# Patient Record
Sex: Female | Born: 1955 | Race: Black or African American | Hispanic: No | Marital: Single | Smoking: Former smoker
Health system: Southern US, Community
[De-identification: ages and names within clinical notes are randomized; demographics above are authoritative.]

## PROBLEM LIST (undated history)

## (undated) DIAGNOSIS — M1990 Unspecified osteoarthritis, unspecified site: Secondary | ICD-10-CM

## (undated) DIAGNOSIS — F32A Depression, unspecified: Secondary | ICD-10-CM

## (undated) DIAGNOSIS — I10 Essential (primary) hypertension: Secondary | ICD-10-CM

## (undated) DIAGNOSIS — R011 Cardiac murmur, unspecified: Secondary | ICD-10-CM

## (undated) DIAGNOSIS — F419 Anxiety disorder, unspecified: Secondary | ICD-10-CM

## (undated) DIAGNOSIS — E119 Type 2 diabetes mellitus without complications: Secondary | ICD-10-CM

## (undated) DIAGNOSIS — K859 Acute pancreatitis without necrosis or infection, unspecified: Secondary | ICD-10-CM

## (undated) DIAGNOSIS — G709 Myoneural disorder, unspecified: Secondary | ICD-10-CM

## (undated) DIAGNOSIS — D649 Anemia, unspecified: Secondary | ICD-10-CM

## (undated) HISTORY — PX: COLONOSCOPY W/ POLYPECTOMY: SHX1380

## (undated) HISTORY — PX: TONSILLECTOMY: SUR1361

## (undated) HISTORY — PX: UTERINE FIBROID SURGERY: SHX826

## (undated) HISTORY — PX: CERVICAL POLYPECTOMY: SHX88

## (undated) HISTORY — DX: Anemia, unspecified: D64.9

## (undated) HISTORY — PX: CHOLECYSTECTOMY: SHX55

---

## 1997-10-09 ENCOUNTER — Emergency Department (HOSPITAL_COMMUNITY): Admission: EM | Admit: 1997-10-09 | Discharge: 1997-10-09 | Payer: Self-pay | Admitting: Emergency Medicine

## 1997-10-20 ENCOUNTER — Ambulatory Visit (HOSPITAL_BASED_OUTPATIENT_CLINIC_OR_DEPARTMENT_OTHER): Admission: RE | Admit: 1997-10-20 | Discharge: 1997-10-20 | Payer: Self-pay | Admitting: Orthopedic Surgery

## 1999-04-23 ENCOUNTER — Encounter: Payer: Self-pay | Admitting: *Deleted

## 1999-04-23 ENCOUNTER — Ambulatory Visit (HOSPITAL_COMMUNITY): Admission: RE | Admit: 1999-04-23 | Discharge: 1999-04-23 | Payer: Self-pay | Admitting: Family Medicine

## 1999-08-25 ENCOUNTER — Encounter: Payer: Self-pay | Admitting: Family Medicine

## 1999-08-25 ENCOUNTER — Ambulatory Visit (HOSPITAL_COMMUNITY): Admission: RE | Admit: 1999-08-25 | Discharge: 1999-08-25 | Payer: Self-pay | Admitting: Family Medicine

## 2000-12-22 ENCOUNTER — Ambulatory Visit (HOSPITAL_COMMUNITY): Admission: RE | Admit: 2000-12-22 | Discharge: 2000-12-22 | Payer: Self-pay | Admitting: Internal Medicine

## 2002-06-19 ENCOUNTER — Other Ambulatory Visit: Admission: RE | Admit: 2002-06-19 | Discharge: 2002-06-19 | Payer: Self-pay | Admitting: Obstetrics and Gynecology

## 2002-08-26 ENCOUNTER — Ambulatory Visit (HOSPITAL_COMMUNITY): Admission: RE | Admit: 2002-08-26 | Discharge: 2002-08-26 | Payer: Self-pay | Admitting: Obstetrics and Gynecology

## 2002-08-26 ENCOUNTER — Encounter (INDEPENDENT_AMBULATORY_CARE_PROVIDER_SITE_OTHER): Payer: Self-pay | Admitting: *Deleted

## 2002-10-24 ENCOUNTER — Encounter (INDEPENDENT_AMBULATORY_CARE_PROVIDER_SITE_OTHER): Payer: Self-pay | Admitting: Specialist

## 2002-10-24 ENCOUNTER — Ambulatory Visit (HOSPITAL_BASED_OUTPATIENT_CLINIC_OR_DEPARTMENT_OTHER): Admission: RE | Admit: 2002-10-24 | Discharge: 2002-10-24 | Payer: Self-pay | Admitting: Otolaryngology

## 2004-02-16 ENCOUNTER — Other Ambulatory Visit: Admission: RE | Admit: 2004-02-16 | Discharge: 2004-02-16 | Payer: Self-pay | Admitting: Obstetrics and Gynecology

## 2004-04-05 ENCOUNTER — Ambulatory Visit: Payer: Self-pay | Admitting: Internal Medicine

## 2004-04-06 ENCOUNTER — Ambulatory Visit: Payer: Self-pay | Admitting: Internal Medicine

## 2005-03-21 ENCOUNTER — Ambulatory Visit: Payer: Self-pay | Admitting: Internal Medicine

## 2005-03-25 ENCOUNTER — Ambulatory Visit: Payer: Self-pay | Admitting: Internal Medicine

## 2005-04-19 ENCOUNTER — Ambulatory Visit: Payer: Self-pay | Admitting: Internal Medicine

## 2006-02-26 ENCOUNTER — Emergency Department (HOSPITAL_COMMUNITY): Admission: EM | Admit: 2006-02-26 | Discharge: 2006-02-26 | Payer: Self-pay | Admitting: Emergency Medicine

## 2009-04-16 ENCOUNTER — Encounter (INDEPENDENT_AMBULATORY_CARE_PROVIDER_SITE_OTHER): Payer: Self-pay | Admitting: *Deleted

## 2009-04-25 HISTORY — PX: ESOPHAGOGASTRODUODENOSCOPY: SHX1529

## 2009-04-25 HISTORY — PX: COLONOSCOPY W/ POLYPECTOMY: SHX1380

## 2009-08-31 ENCOUNTER — Telehealth: Payer: Self-pay | Admitting: Internal Medicine

## 2009-09-09 ENCOUNTER — Encounter (INDEPENDENT_AMBULATORY_CARE_PROVIDER_SITE_OTHER): Payer: Self-pay | Admitting: *Deleted

## 2009-09-17 ENCOUNTER — Ambulatory Visit: Payer: Self-pay | Admitting: Internal Medicine

## 2009-09-17 ENCOUNTER — Encounter (INDEPENDENT_AMBULATORY_CARE_PROVIDER_SITE_OTHER): Payer: Self-pay | Admitting: *Deleted

## 2009-09-18 ENCOUNTER — Telehealth: Payer: Self-pay | Admitting: Internal Medicine

## 2009-09-22 ENCOUNTER — Ambulatory Visit: Payer: Self-pay | Admitting: Internal Medicine

## 2009-09-25 ENCOUNTER — Encounter: Payer: Self-pay | Admitting: Internal Medicine

## 2010-01-26 ENCOUNTER — Encounter: Admission: RE | Admit: 2010-01-26 | Discharge: 2010-01-26 | Payer: Self-pay | Admitting: Family Medicine

## 2010-02-18 ENCOUNTER — Ambulatory Visit (HOSPITAL_COMMUNITY): Admission: RE | Admit: 2010-02-18 | Discharge: 2010-02-18 | Payer: Self-pay | Admitting: Surgery

## 2010-05-13 ENCOUNTER — Inpatient Hospital Stay (HOSPITAL_COMMUNITY)
Admission: EM | Admit: 2010-05-13 | Discharge: 2010-05-15 | Payer: Self-pay | Source: Home / Self Care | Attending: Internal Medicine | Admitting: Internal Medicine

## 2010-05-17 LAB — GLUCOSE, CAPILLARY
Glucose-Capillary: 181 mg/dL — ABNORMAL HIGH (ref 70–99)
Glucose-Capillary: 130 mg/dL — ABNORMAL HIGH (ref 70–99)
Glucose-Capillary: 132 mg/dL — ABNORMAL HIGH (ref 70–99)
Glucose-Capillary: 136 mg/dL — ABNORMAL HIGH (ref 70–99)
Glucose-Capillary: 125 mg/dL — ABNORMAL HIGH (ref 70–99)
Glucose-Capillary: 140 mg/dL — ABNORMAL HIGH (ref 70–99)
Glucose-Capillary: 149 mg/dL — ABNORMAL HIGH (ref 70–99)
Glucose-Capillary: 113 mg/dL — ABNORMAL HIGH (ref 70–99)

## 2010-05-17 LAB — URINE CULTURE: Culture  Setup Time: 201201190456

## 2010-05-17 LAB — CBC
HCT: 37 % (ref 36.0–46.0)
Hemoglobin: 12.2 g/dL (ref 12.0–15.0)
Hemoglobin: 10.8 g/dL — ABNORMAL LOW (ref 12.0–15.0)
MCH: 27.8 pg (ref 26.0–34.0)
MCV: 85.5 fL (ref 78.0–100.0)
Platelets: 359 10*3/uL (ref 150–400)
RBC: 4.33 MIL/uL (ref 3.87–5.11)
RBC: 3.88 MIL/uL (ref 3.87–5.11)
RDW: 12.9 % (ref 11.5–15.5)
WBC: 11.4 10*3/uL — ABNORMAL HIGH (ref 4.0–10.5)
WBC: 11.6 10*3/uL — ABNORMAL HIGH (ref 4.0–10.5)
WBC: 7.5 10*3/uL (ref 4.0–10.5)

## 2010-05-17 LAB — RAPID URINE DRUG SCREEN, HOSP PERFORMED
Barbiturates: NOT DETECTED
Benzodiazepines: NOT DETECTED

## 2010-05-17 LAB — HEMOGLOBIN A1C
Hgb A1c MFr Bld: 6.8 % — ABNORMAL HIGH (ref <5.7)
Mean Plasma Glucose: 148 mg/dL — ABNORMAL HIGH (ref <117)

## 2010-05-17 LAB — IRON AND TIBC
Iron: 35 ug/dL — ABNORMAL LOW (ref 42–135)
Saturation Ratios: 11 % — ABNORMAL LOW (ref 20–55)
TIBC: 311 ug/dL (ref 250–470)
UIBC: 276 ug/dL

## 2010-05-17 LAB — BASIC METABOLIC PANEL
CO2: 26 mEq/L (ref 19–32)
Chloride: 108 mEq/L (ref 96–112)
Creatinine, Ser: 0.86 mg/dL (ref 0.4–1.2)
GFR calc Af Amer: >60 mL/min (ref >60)
Potassium: 3.5 mEq/L (ref 3.5–5.1)
Sodium: 140 mEq/L (ref 135–145)

## 2010-05-17 LAB — URINALYSIS, ROUTINE W REFLEX MICROSCOPIC
Hgb urine dipstick: NEGATIVE
Ketones, ur: NEGATIVE mg/dL
Protein, ur: NEGATIVE mg/dL
Urobilinogen, UA: 0.2 mg/dL (ref 0.0–1.0)

## 2010-05-17 LAB — CARDIAC PANEL(CRET KIN+CKTOT+MB+TROPI)
CK, MB: 1.4 ng/mL (ref 0.3–4.0)
Relative Index: INVALID (ref 0.0–2.5)
Total CK: 90 U/L (ref 7–177)
Troponin I: 0.01 ng/mL (ref 0.00–0.06)
Troponin I: <0.01 ng/mL (ref 0.00–0.06)

## 2010-05-17 LAB — POCT I-STAT, CHEM 8
BUN: 19 mg/dL (ref 6–23)
Calcium, Ion: 1.07 mmol/L — ABNORMAL LOW (ref 1.12–1.32)
Chloride: 106 mEq/L (ref 96–112)
Glucose, Bld: 153 mg/dL — ABNORMAL HIGH (ref 70–99)
HCT: 39 % (ref 36.0–46.0)
Hemoglobin: 13.3 g/dL (ref 12.0–15.0)
Sodium: 142 mEq/L (ref 135–145)

## 2010-05-17 LAB — DIFFERENTIAL
Basophils Absolute: 0 10*3/uL (ref 0.0–0.1)
Basophils Absolute: 0 10*3/uL (ref 0.0–0.1)
Basophils Relative: 0 % (ref 0–1)
Basophils Relative: 0 % (ref 0–1)
Eosinophils Absolute: 0.2 10*3/uL (ref 0.0–0.7)
Eosinophils Relative: 2 % (ref 0–5)
Lymphocytes Relative: 40 % (ref 12–46)
Monocytes Absolute: 0.5 10*3/uL (ref 0.1–1.0)
Neutro Abs: 3.6 10*3/uL (ref 1.7–7.7)
Neutrophils Relative %: 48 % (ref 43–77)

## 2010-05-17 LAB — COMPREHENSIVE METABOLIC PANEL
ALT: 19 U/L (ref 0–35)
Albumin: 4.2 g/dL (ref 3.5–5.2)
Albumin: 4.3 g/dL (ref 3.5–5.2)
Alkaline Phosphatase: 65 U/L (ref 39–117)
Alkaline Phosphatase: 66 U/L (ref 39–117)
BUN: 16 mg/dL (ref 6–23)
BUN: 16 mg/dL (ref 6–23)
CO2: 24 mEq/L (ref 19–32)
Chloride: 105 mEq/L (ref 96–112)
Chloride: 108 mEq/L (ref 96–112)
Creatinine, Ser: 0.88 mg/dL (ref 0.4–1.2)
GFR calc non Af Amer: >60 mL/min (ref >60)
Glucose, Bld: 159 mg/dL — ABNORMAL HIGH (ref 70–99)
Potassium: 3.7 mEq/L (ref 3.5–5.1)
Potassium: 3.9 mEq/L (ref 3.5–5.1)
Total Bilirubin: 0.4 mg/dL (ref 0.3–1.2)
Total Bilirubin: 0.4 mg/dL (ref 0.3–1.2)

## 2010-05-17 LAB — APTT: aPTT: 25 seconds (ref 24–37)

## 2010-05-17 LAB — LIPID PANEL
HDL: 38 mg/dL — ABNORMAL LOW (ref >39)
LDL Cholesterol: 64 mg/dL (ref 0–99)
Total CHOL/HDL Ratio: 3 RATIO
Triglycerides: 63 mg/dL (ref <150)
VLDL: 13 mg/dL (ref 0–40)

## 2010-05-17 LAB — URINE MICROSCOPIC-ADD ON

## 2010-05-17 LAB — PROTIME-INR: INR: 0.89 (ref 0.00–1.49)

## 2010-05-18 LAB — BASIC METABOLIC PANEL
Calcium: 9.5 mg/dL (ref 8.4–10.5)
GFR calc Af Amer: >60 mL/min (ref >60)
GFR calc non Af Amer: >60 mL/min (ref >60)
Glucose, Bld: 136 mg/dL — ABNORMAL HIGH (ref 70–99)
Potassium: 4.3 mEq/L (ref 3.5–5.1)
Sodium: 140 mEq/L (ref 135–145)

## 2010-05-18 LAB — CBC
HCT: 35.9 % — ABNORMAL LOW (ref 36.0–46.0)
Hemoglobin: 11.8 g/dL — ABNORMAL LOW (ref 12.0–15.0)
MCHC: 32.9 g/dL (ref 30.0–36.0)
RDW: 13 % (ref 11.5–15.5)
WBC: 7.2 10*3/uL (ref 4.0–10.5)

## 2010-05-18 LAB — DIFFERENTIAL
Basophils Absolute: 0 10*3/uL (ref 0.0–0.1)
Basophils Relative: 0 % (ref 0–1)
Lymphocytes Relative: 43 % (ref 12–46)
Monocytes Absolute: 0.5 10*3/uL (ref 0.1–1.0)
Neutro Abs: 3.3 10*3/uL (ref 1.7–7.7)

## 2010-05-18 LAB — GLUCOSE, CAPILLARY: Glucose-Capillary: 108 mg/dL — ABNORMAL HIGH (ref 70–99)

## 2010-05-21 NOTE — Discharge Summary (Addendum)
NAMELAURELIN, Kimberly Hunt NO.:  [id number]  MEDICAL RECORD NO.:  [id number]          PATIENT TYPE:  INP  LOCATION:  2012                         FACILITY:  [facility]  PHYSICIAN:  Kimberly Harvest, MD    DATE OF BIRTH:  November 29, 1955  DATE OF ADMISSION:  05/13/2010 DATE OF DISCHARGE:  05/15/2010                              DISCHARGE SUMMARY   PRIMARY CARE PHYSICIAN:  Dr. Tiburcio Hunt of [facility].  DISCHARGE DIAGNOSES: 1. Syncope of unknown etiology. 2. Hypertension. 3. Cervical spinal stenosis with myelopathy C4-C5, C5-C6, C6-C7. 4. Probable urinary tract infection status post 3 days of IV     antibiotics. 5. Anemia of chronic disease, stable. 6. Diet controlled type 2 diabetes, hemoglobin A1c of 6.8 on May 13, 2010. 7. Anxiety. 8. Status post cholecystectomy. 9. Status post myomectomy.  DISCHARGE MEDICATIONS: 1. Hydrochlorothiazide 25 mg p.o. daily. 2. Hydrocodone/APAP 5/325 one to two tablets p.o. q.4 h p.r.n. 3. Lisinopril 40 mg p.o. daily. 4. Alprazolam 0.25 mg half to one tablet p.o. daily p.r.n. 5. Sertraline 100 mg p.o. q.h.s.  DISPOSITION AND FOLLOWUP:  The patient will be discharged home.  The patient is to follow up with PCP in the next 1 to 2 weeks.  On followup the patient will need a BMET checked to follow up on electrolytes and renal function as the patient's lisinopril has been increased.  The patient will need her blood pressure reassessed.  She has been maintained on HCTZ and lisinopril was increased from 20 mg to 40 mg daily for better blood pressure control.  The patient is also to call to schedule a followup appointment with Dr. Julio Hunt of [facility] in 1 week for further evaluation and recommendations for her cervical spinal stenosis with myelopathy which will probably require surgery.  CONSULTATIONS:  Neurosurgery was consulted.  The patient was seen by Dr. Jordan Hunt on May 14, 2010.  PROCEDURES PERFORMED:  Chest  x-ray was done May 13, 2010, that showed minimal basilar atelectasis.  No other acute findings identified. CT of the head without contrast was done on May 13, 2010, that showed unremarkable noncontrast head CT.  MRI/MRA of the head was done on May 13, 2010, which was negative MRI of the head; MRA was negative for fracture or cord edema.  MRI/MRA was negative.  MR of the C- spine did show multilevel spondylosis and spinal stenosis in C4-C5, C5- C6, C6-C7.  A 2-D echocardiogram was done on May 14, 2010, that showed a normal left ventricular cavity size, systolic function was normal, EF 55% to 60%.  The wall motion was normal.  There were no regional wall motion abnormalities.  Left atrium was mildly dilated. Carotid Dopplers were done on May 14, 2010, with preliminary results showing no stenosis in the right ICA, left ICA with 40% to 59% stenosis, bilateral vertebrals were antegrade is the preliminary results.  An EEG was done on May 14, 2010, that showed a normal EEG in the awake state.  BRIEF ADMISSION HISTORY AND PHYSICAL:  [name] is a 55 year old African American female with history of hypertension, type  2 diabetes diet controlled, history of chronic pain and anxiety that had gone to bed after which she felt very uneasy and thought she would need to go to the bathroom to urinate.  On the way she fell and lost her consciousness.  She woke up herself and again tried to go to the bathroom and fell again.  She states she might have lost consciousness for less than a minute.  She did not bite her tongue nor did she have any incontinence of urine.  Denied any headaches or visual symptoms or focal deficits.  No abdominal pain.  The patient was brought to the ED per EMS.  In the ED the patient had a CT of the head which was negative at which time the patient was being admitted for further workup for her syncope.  In the ED the patient had some chest  tightness lasting for about an hour.  At that time the patient's chest pain had resolved. Denied any nausea, no vomiting, no shortness of breath.  She did have some neck pain that had been there since she fell.  She has been persistent and has no relation to head movements or neck movements.  The patient's blood pressure was found to be elevated at 199/100.  The patient denied any fever.  No chills, no cough and no phlegm.  For rest of admission history and physical please see H and P dictated per Dr. Toniann Hunt of job number [id number].  HOSPITAL COURSE: 1. Syncope.  The patient was admitted for a syncopal episode.  She was     placed on telemetry and a syncope workup was undertaken.  A 2-D     echo was done with results as stated above which showed a normal EF     and normal 2-D echo.  The aortic valve was normal.  Carotid     Dopplers which were done did not show any significant stenosis,     preliminary results showed no stenosis in the right ICA, the left     ICA had 40% to 59% stenosis, however, this was not significant and     unlikely cause of her syncope.  The patient did not have any     arrhythmias on tele.  EEG which was done was negative for any     epileptiform discharges.  The patient remained stable.  She did not     have any further syncopal episodes during the hospitalization and     she will be discharged in stable and improved condition to follow     up with PCP as an outpatient.  On followup final carotid Doppler     readings will need to be followed up upon per PCP.  The patient     will be discharged in stable and improved condition. 2. Hypertension.  On admission the patient was admitted with an     elevated blood pressure.  The patient had been unable to fill her     blood pressure medications as an outpatient.  She was placed back     on her home regimen of 20 of lisinopril and HCTZ.  The patient's     blood pressure improved from the 190s to the 170s-180s.  The      lisinopril was further up titrated and 230 mg of lisinopril daily,     however, her systolic blood pressure was still in the 150s to the     160s and as such she will be discharged  home on an increased dose     of lisinopril at 40 mg daily.  She will be maintained on other     blood pressure medication of HCTZ 25 mg daily.  She will need     followup with her PCP for further blood pressure control and     titration of her blood pressure medications.  The patient will be     discharged in stable and improved condition. 3. Cervical spinal stenosis with myelopathy C4-C5, C5-C6, C6-C7.  On     admission the patient had had some complaints of neck pain.  The     patient had stated that the neck pain had been ongoing for several     months now and did have some occasional tingling on her bilateral     upper extremities.  An MR of the C-spine was done which did show     multilevel spondylosis and it also showed mild to moderate spinal     stenosis from C4-C5, C5-C6 and C6-C7.  Dr. Jordan Hunt of [facility] was consulted upon.  He looked at the films and came     in and spoke with the patient and he felt that the patient could be     seen as an outpatient in his clinic once she was medically stable     and evaluation for her syncope was done.  The patient would likely     may need surgery in the future.  She will follow up with Dr. Jordan Hunt     of [facility] as an outpatient for further evaluation     and recommendations. 4. Urinary tract infection.  On admission the urinalysis was     consistent with a UTI.  The patient was placed on IV ciprofloxacin.     However, urine cultures came back negative.  The patient did     receive 3 days of IV Rocephin during this hospitalization and has     been adequately treated for uncomplicated urinary tract infection. 5. The rest of the patient's chronic medical issues remained stable     throughout the hospitalization and the patient will be  discharged     in stable and improved condition.  DISCHARGE VITAL SIGNS:  Temperature 97.8, pulse of 76, respirations 20, blood pressure 160/92, satting 96% on room air.  DISCHARGE LABS:  Sodium 140, potassium 4.3, chloride 103, bicarb 25, glucose 136, BUN 13, creatinine 0.96 and a calcium of 9.5.  CBC with a Rynlee Lisbon count of 7.2, hemoglobin 11.8, hematocrit 35.9 and a platelet count of 343 with an ANC of 3.3.  Anemia panel which was obtained did show iron level of 35, a TIBC of 311, B12 of 386, folate of 13.8 and a ferritin of 107.  It was a pleasure taking care of [name].     Kimberly Harvest, MD     DT/MEDQ  D:  05/15/2010  T:  05/15/2010  Job:  [id number]  cc:   Melida Quitter, M.D. Henry A. Pool, M.D.  Electronically Signed by Kimberly Harvest MD on 05/21/2010 12:27:16 PM

## 2010-05-25 NOTE — Letter (Signed)
Summary: Previsit letter  [facility]  [address] [place], [place] [address]   Phone: [phone]  Fax: [phone]       09/09/2009 MRN: [id number]  [facility] [address] [place], [place]  [address]  Dear Ms. Rolena Infante,  Welcome to the Gastroenterology Division at [facility].    You are scheduled to see a nurse for your pre-procedure visit on 09/16/2009 at 4:30pm on the 3rd floor at [facility], [address]  We ask that you try to arrive at our office 15 minutes prior to your appointment time to allow for check-in.  Your nurse visit will consist of discussing your medical and surgical history, your immediate family medical history, and your medications.    Please bring a complete list of all your medications or, if you prefer, bring the medication bottles and we will list them.  We will need to be aware of both prescribed and over the counter drugs.  We will need to know exact dosage information as well.  If you are on blood thinners (Coumadin, Plavix, Aggrenox, Ticlid, etc.) please call our office today/prior to your appointment, as we need to consult with your physician about holding your medication.   Please be prepared to read and sign documents such as consent forms, a financial agreement, and acknowledgement forms.  If necessary, and with your consent, a friend or relative is welcome to sit-in on the nurse visit with you.  Please bring your insurance card so that we may make a copy of it.  If your insurance requires a referral to see a specialist, please bring your referral form from your primary care physician.  No co-pay is required for this nurse visit.     If you cannot keep your appointment, please call [phone] to cancel or reschedule prior to your appointment date.  This allows [place] the opportunity to schedule an appointment for another patient in need of care.    Thank you for choosing [facility] for your  medical needs.  We appreciate the opportunity to care for you.  Please visit [place] at our website  to learn more about our practice.                     Sincerely.                                                                                                                   The Gastroenterology Division

## 2010-05-25 NOTE — Progress Notes (Signed)
Summary: Moviprep  Phone Note Call from Patient Call back at Home Phone [phone]   Caller: Patient Call For: Dr. Marina Goodell Reason for Call: Talk to Nurse Summary of Call: pt is calling about her prep work...she does not have the Moviprep. She thought she left it here yesterday Initial call taken by: Karna Christmas,  Sep 18, 2009 9:55 AM  Follow-up for Phone Call        returned pt's phone call. now answer. will try again. Follow-up by: Ezra Sites RN,  Sep 18, 2009 11:06 AM    Additional Follow-up for Phone Call Additional follow up Details #2::    Talked with pt.  explained to pt that she could pick up moviprep from [facility] on [address].  Rx for Moviprep called in to pharmacy. Follow-up by: Ezra Sites RN,  Sep 18, 2009 11:14 AM

## 2010-05-25 NOTE — Letter (Signed)
Summary: Diabetic Instructions  [facility]  [address] [place], [place] [address]   Phone: [phone]  Fax: [phone]    Kimberly Hunt 09-Dec-1955 MRN: [id number]   _  _   ORAL DIABETIC MEDICATION INSTRUCTIONS  The day before your procedure:   Take your diabetic pill as you do normally  The day of your procedure:   Do not take your diabetic pill    We will check your blood sugar levels during the admission process and again in Recovery before discharging you home  ________________________________________________________________________  _  _   INSULIN (LONG ACTING) MEDICATION INSTRUCTIONS (Lantus, NPH, 70/30, Humulin, Novolin-N)   The day before your procedure:   Take  your regular evening dose    The day of your procedure:   Do not take your morning dose    _  _   INSULIN (SHORT ACTING) MEDICATION INSTRUCTIONS (Regular, Humulog, Novolog)   The day before your procedure:   Do not take your evening dose   The day of your procedure:   Do not take your morning dose   _  _   INSULIN PUMP MEDICATION INSTRUCTIONS  We will contact the physician managing your diabetic care for written dosage instructions for the day before your procedure and the day of your procedure.  Once we have received the instructions, we will contact you.

## 2010-05-25 NOTE — Letter (Signed)
Summary: Patient Notice- Polyp Results  [facility]  [address] [place], [place] [address]   Phone: [phone]  Fax: [phone]        September 25, 2009 MRN: [id number]    [facility] [address] [place], [place]  [address]    Dear Ms. FICEK,  I am pleased to inform you that the colon polyp(s) removed during your recent colonoscopy was (were) found to be benign (no cancer detected) upon pathologic examination.  I recommend you have a repeat colonoscopy examination in 5 years to look for recurrent polyps, as having colon polyps increases your risk for having recurrent polyps or even colon cancer in the future.  Should you develop new or worsening symptoms of abdominal pain, bowel habit changes or bleeding from the rectum or bowels, please schedule an evaluation with either your primary care physician or with me.  Additional information/recommendations:  __ No further action with gastroenterology is needed at this time. Please      follow-up with your primary care physician for your other healthcare      needs.   Please call us if you are having persistent problems or have questions about your condition that have not been fully answered at this time.  Sincerely,  Hilarie Fredrickson MD  This letter has been electronically signed by your physician.  Appended Document: Patient Notice- Polyp Results letter mailed.

## 2010-05-25 NOTE — Miscellaneous (Signed)
Summary: [facility]  Clinical Lists Changes   Rx for Moviprep called in to [facility] on [address].

## 2010-05-25 NOTE — Procedures (Signed)
Summary: Colonoscopy  Patient: Kimberly Hunt Note: All result statuses are Final unless otherwise noted.  Tests: (1) Colonoscopy (COL)   COL Colonoscopy           DONE     [facility]     [address]     [place], [place]  [address]           COLONOSCOPY PROCEDURE REPORT           PATIENT:  Kimberly Hunt, Kimberly Hunt  MR#:  [id number]     BIRTHDATE:  1955/06/14, 53 yrs. old  GENDER:  female     ENDOSCOPIST:  Wilhemina Bonito. Eda Keys, MD     REF. BY:  Surveillance Program Recall,     PROCEDURE DATE:  09/22/2009     PROCEDURE:  Colonoscopy with snare polypectomy x 1     ASA CLASS:  Class II     INDICATIONS:  history of pre-cancerous (adenomatous) colon polyps     ; index exam 03-2004 w/ small adenoma     MEDICATIONS:   Fentanyl 100 mcg IV, Versed 10 mg IV           DESCRIPTION OF PROCEDURE:   After the risks benefits and     alternatives of the procedure were thoroughly explained, informed     consent was obtained.  Digital rectal exam was performed and     revealed no abnormalities.   The LB CF-[id number] endoscope     was introduced through the anus and advanced to the cecum, which     was identified by both the appendix and ileocecal valve, without     limitations.Time to cecum = 3:01 min. The quality of the prep was     excellent, using MoviPrep.  The instrument was then slowly     withdrawn (time = 11:47 min) as the colon was fully examined.     <<PROCEDUREIMAGES>>           FINDINGS:  A diminutive polyp was found in the ascending colon.     Polyp was snared without cautery. Retrieval was successful. snare     polyp  This was otherwise a normal examination of the colon.     Retroflexed views in the rectum revealed no abnormalities.    The     scope was then withdrawn from the patient and the procedure     completed.           COMPLICATIONS:  None     ENDOSCOPIC IMPRESSION:     1) Diminutive polyp in the ascending colon - removed     2) Otherwise normal examination         RECOMMENDATIONS:     1) Follow up colonoscopy in 5 years           ______________________________     Wilhemina Bonito. Eda Keys, MD           CC:  Holley Bouche, MD;The Patient           n.     Rosalie DoctorWilhemina Bonito. Eda Keys at 09/22/2009 03:17 PM           Leda Quail, [id number]  Note: An exclamation mark (!) indicates a result that was not dispersed into the flowsheet. Document Creation Date: 09/22/2009 3:17 PM _______________________________________________________________________  (1) Order result status: Final Collection or observation date-time: 09/22/2009 15:13 Requested date-time:  Receipt date-time:  Reported date-time:  Referring Physician:   Ordering Physician: Yancey Flemings  Montez Hageman [phone]) Specimen Source:  Source: Launa Grill Order Number: [id number] Lab site:   Appended Document: Colonoscopy recall 5 yrs     Procedures Next Due Date:    Colonoscopy: 08/2014

## 2010-05-25 NOTE — Letter (Signed)
Summary: [facility] Instructions  [facility]  [address] [place], [place] [address]   Phone: [phone]  Fax: [phone]       Kimberly Hunt    05/15/1955    MRN: [id number]        Procedure Day /Date:  09/22/09  Tuesday     Arrival Time:   2pm     Procedure Time: 3pm     Location of Procedure:                    _ x_  [facility] (4th Floor)                        PREPARATION FOR COLONOSCOPY WITH MOVIPREP   Starting 5 days prior to your procedure _ 5/26/11_ do not eat nuts, seeds, popcorn, corn, beans, peas,  salads, or any raw vegetables.  Do not take any fiber supplements (e.g. Metamucil, Citrucel, and Benefiber).  THE DAY BEFORE YOUR PROCEDURE         DATE:    09/21/09   DAY:  Monday  1.  Drink clear liquids the entire day-NO SOLID FOOD  2.  Do not drink anything colored red or purple.  Avoid juices with pulp.  No orange juice.  3.  Drink at least 64 oz. (8 glasses) of fluid/clear liquids during the day to prevent dehydration and help the prep work efficiently.  CLEAR LIQUIDS INCLUDE: Water Jello Ice Popsicles Tea (sugar ok, no milk/cream) Powdered fruit flavored drinks Coffee (sugar ok, no milk/cream) Gatorade Juice: apple, white grape, white cranberry  Lemonade Clear bullion, consomm, broth Carbonated beverages (any kind) Strained chicken noodle soup Hard Candy                             4.  In the morning, mix first dose of MoviPrep solution:    Empty 1 Pouch A and 1 Pouch B into the disposable container    Add lukewarm drinking water to the top line of the container. Mix to dissolve    Refrigerate (mixed solution should be used within 24 hrs)  5.  Begin drinking the prep at 5:00 p.m. The MoviPrep container is divided by 4 marks.   Every 15 minutes drink the solution down to the next mark (approximately 8 oz) until the full liter is complete.   6.  Follow completed prep with 16 oz of clear liquid of your choice  (Nothing red or purple).  Continue to drink clear liquids until bedtime.  7.  Before going to bed, mix second dose of MoviPrep solution:    Empty 1 Pouch A and 1 Pouch B into the disposable container    Add lukewarm drinking water to the top line of the container. Mix to dissolve    Refrigerate  THE DAY OF YOUR PROCEDURE      DATE:   09/22/09  DAY:  Tuesday  Beginning at 10:00 a.m. (5 hours before procedure):         1. Every 15 minutes, drink the solution down to the next mark (approx 8 oz) until the full liter is complete.  2. Follow completed prep with 16 oz. of clear liquid of your choice.    3. You may drink clear liquids until   1pm (2 HOURS BEFORE PROCEDURE).   MEDICATION INSTRUCTIONS  Unless otherwise instructed, you should take regular prescription medications with a  small sip of water   as early as possible the morning of your procedure.  Diabetic patients - see separate instructions.    Additional medication instructions: Zachery Conch your meds except for the diabetes meds.  See separate sheet         OTHER INSTRUCTIONS  You will need a responsible adult at least 57 years of age to accompany you and drive you home.   This person must remain in the waiting room during your procedure.  Wear loose fitting clothing that is easily removed.  Leave jewelry and other valuables at home.  However, you may wish to bring a book to read or  an iPod/MP3 player to listen to music as you wait for your procedure to start.  Remove all body piercing jewelry and leave at home.  Total time from sign-in until discharge is approximately 2-3 hours.  You should go home directly after your procedure and rest.  You can resume normal activities the  day after your procedure.  The day of your procedure you should not:   Drive   Make legal decisions   Operate machinery   Drink alcohol   Return to work  You will receive specific instructions about eating, activities and  medications before you leave.    The above instructions have been reviewed and explained to me by   Clide Cliff, RN_______________________    I fully understand and can verbalize these instructions _____________________________ Date _________

## 2010-05-25 NOTE — Progress Notes (Signed)
Summary: Schedule Colonoscopy  Phone Note Outgoing Call Call back at [facility] Phone [phone]   Call placed by: Harlow Mares CMA [facility]),  Aug 31, 2009 10:21 AM Call placed to: Patient Summary of Call: Left message on patients machine to call back. patient is due for her colonoscopy Initial call taken by: Harlow Mares CMA [facility]),  Aug 31, 2009 10:22 AM  Follow-up for Phone Call        scheduled for colonoscopy on 09/22/2009 Follow-up by: Harlow Mares CMA [facility]),  Sep 09, 2009 10:43 AM

## 2010-05-26 NOTE — H&P (Signed)
NAMECHARLII, Kimberly Hunt NO.:  [id number]  MEDICAL RECORD NO.:  [id number]          PATIENT TYPE:  EMS  LOCATION:  [facility]                         FACILITY:  [facility]  PHYSICIAN:  Eduard Clos, MDDATE OF BIRTH:  04/21/1956  DATE OF ADMISSION:  05/13/2010 DATE OF DISCHARGE:                             HISTORY & PHYSICAL   PRIMARY CARE PHYSICIAN:  Noberto Retort, MD, of [place].  CHIEF COMPLAINT:  Loss of consciousness.  HISTORY OF PRESENT ILLNESS:  This is a 55 year old female with history of hypertension; diabetes mellitus type 2, not on medication; chronic pain; and anxiety had gone to bed after which she felt very uneasy and thought she would need to go to the bathroom to urinate, on the way she fell and lost her consciousness, and she woke up herself and again tried to go to the bathroom and again she fell.  She states she might have lost the consciousness for less than a minute.  She did not bite her tongue or did not have any incontinence of urine.  Denies any headache or visual symptoms or focal deficit.  Denies any abdominal pain.  The patient was brought to the ER after the patient called the EMS.  In the ER, the patient had a CT head which is negative.  At this time, the patient has been admitted for further workup for her syncope.  In the ER, the patient had some chest tightness which lasted for 1 hour. At this time, the patient's chest pain has resolved.  Denies any nausea, vomiting, shortness of breath.  Has some neck pain which has been there since she fell.  It has been persistent and has no relation to head movement or neck movements.  The patient's blood pressure was also found to be very high around 199/100.  The patient does not have any fever or chills, cough, or phlegm.  PAST MEDICAL HISTORY: 1. Hypertension.  Has not been taking her medications for the last 2     weeks, she has to refill it. 2. Diabetes, on diet. 3. Anxiety.  PAST  SURGICAL HISTORY: 1. Cholecystectomy. 2. Myomectomy.  MEDICATIONS ON ADMISSION: 1. Sertraline 100 mg p.o. daily at bedtime. 2. Lisinopril HCT 20/25 p.o. daily which she has not been taking for 2     weeks. 3. Hydrocodone and acetaminophen p.r.n. for low back, have a tablet     twice daily. 4. Alprazolam 0.25 mg p.r.n. for anxiety.  ALLERGIES:  No known drug allergies.  FAMILY HISTORY:  Noncontributory.  SOCIAL HISTORY:  The patient denies smoking cigarettes, drinking alcohol, or using illegal drugs.  Lives alone, frequented by her sister.  REVIEW OF SYSTEMS:  As per the history of presenting illness, nothing else significant.  PHYSICAL EXAMINATION:  GENERAL:  The patient was examined at bedside, not in acute distress. VITAL SIGNS:  Blood pressure is 199/100, pulse is 90 per minute, temperature 98.8, respiration 18 per minute, O2 sat 98%. HEENT:  Anicteric.  No pallor.  No discharge from ears, eyes, nose, or mouth. CHEST:  Bilateral air entry present.  No rhonchi.  No crepitation.  S1  and S2 heard. ABDOMEN:  Soft, nontender.  Bowel sounds heard. CENTRAL NERVOUS SYSTEM:  The patient is alert, awake, and oriented to time, place, and person.  Moves upper and lower extremities 5/5.  There is no pronator drift.  No dysdiadochokinesia.  No ataxia. EXTREMITIES:  Peripheral pulses felt.  No edema.  EKG, normal sinus rhythm with nonspecific ST changes with a heart rate of 87 beats per minute. CT head without contrast shows nothing remarkable. Chest x-ray, minimal basilar atelectasis and no other acute findings identified.  LABORATORY DATA:  CBC; WBC is 11.4, hemoglobin is 13.3, hematocrit is 39, platelets 359.  PT/INR are 12.3 and 0.8.  Complete metabolic panel; sodium 142, potassium 3.5, chloride 106, carbon dioxide 26, glucose 153, BUN 19, creatinine 1, total bilirubin is 0.4, alkaline phosphatase 65, AST 21, ALT 19, albumin is 4.2, calcium 9.2.  CK-MB less than 1, troponin  less than 0.05, myoglobin 100.  UA shows moderate leukocytes, casts hyaline, wbc's 7-10, bacteria rare.  ASSESSMENT: 1. Accelerated hypertension. 2. Syncope. 3. Chest pain. 4. Urinary tract infection. 5. History of diabetes mellitus, type 2. 6. Neck pain. 7. Noncompliance with medications.  PLAN: 1. At this time is to admit the patient to telemetry. 2. For her chest pain, we are going to cycle her cardiac markers, get     a 2-D echo.  We will place the patient on aspirin. 3. For her syncope at this time, we are going to check orthostatic     blood pressures and get MRI of the brain and also get MRI of C-     spine as the patient has some neck pain, carotid Dopplers and EEG. 4. For UTI, the patient will be on ceftriaxone.  We will check urine     cultures. 5. For her diabetes, we will check hemoglobin A1c.  The patient will     be on sliding scale coverage. 6. Further recommendation based on the test orders and clinical     course.     Eduard Clos, MD     ANK/MEDQ  D:  05/13/2010  T:  05/13/2010  Job:  [id number]  cc:   Melida Quitter, M.D.  Electronically Signed by Midge Minium MD on 05/26/2010 10:01:44 AM

## 2010-07-07 LAB — GLUCOSE, CAPILLARY: Glucose-Capillary: 131 mg/dL — ABNORMAL HIGH (ref 70–99)

## 2010-07-07 LAB — BASIC METABOLIC PANEL
BUN: 12 mg/dL (ref 6–23)
Calcium: 9.5 mg/dL (ref 8.4–10.5)
Creatinine, Ser: 0.89 mg/dL (ref 0.4–1.2)
GFR calc non Af Amer: >60 mL/min (ref >60)
Glucose, Bld: 113 mg/dL — ABNORMAL HIGH (ref 70–99)

## 2010-07-07 LAB — SURGICAL PCR SCREEN
MRSA, PCR: NEGATIVE
Staphylococcus aureus: NEGATIVE

## 2010-07-07 LAB — CBC
MCHC: 34.5 g/dL (ref 30.0–36.0)
Platelets: 507 10*3/uL — ABNORMAL HIGH (ref 150–400)
RDW: 12.4 % (ref 11.5–15.5)
WBC: 7.9 10*3/uL (ref 4.0–10.5)

## 2010-09-10 NOTE — Op Note (Signed)
   NAME:  Kimberly Hunt, Kimberly Hunt                         ACCOUNT NO.:  [id number]   MEDICAL RECORD NO.:  [id number]                   PATIENT TYPE:  AMB   LOCATION:  SDC                                  [facility]   PHYSICIAN:  Juluis Mire, M.D.                DATE OF BIRTH:  12/28/1955   DATE OF PROCEDURE:  08/26/2002  DATE OF DISCHARGE:                                 OPERATIVE REPORT   PREOPERATIVE DIAGNOSIS:  Post menopausal bleeding with probable endometrial  polyp.   POSTOPERATIVE DIAGNOSIS:  Post menopausal bleeding with probable endometrial  polyp.   OPERATIVE PROCEDURE:  1. Paracervical block.  2. Cervical dilation.  3. Hysteroscopy with multiple endometrial biopsies.  4. Endometrial curetting.   SURGEON:  Juluis Mire, M.D.   ANESTHESIA:  Paracervical block with sedation.   ESTIMATED BLOOD LOSS:  Minimal.   PACKS AND DRAINS:  None.   INTRAOPERATIVE BLOOD REPLACED:  None.   COMPLICATIONS:  None.   INDICATIONS:  Dictated in the History and Procedure.   DESCRIPTION OF PROCEDURE:  The patient was taken to the OR, placed in the  supine position.  After sedation was placed in the dorsal lithotomy position  using the Allen stirrups.  At this point in time the patient was draped out  for hysteroscopy.  A speculum was placed in the vaginal vault.  The cervix  and vagina were cleansed with Betadine.  A paracervical block was instituted  using 1% Xylocaine.  The cervix was grasped with a single tooth tenaculum.  The uterus sounded to approximately 8 cm.  The cervix was serially dilated  to a size 35 Pratt dilator.  The operative hysteroscope was introduced.  The  intrauterine cavity was distended using sorbitol.  Smooth, atrophic  endometrium was noted.  There were no polyps or thickening noted at this  point in time.  Biopsies were obtained from the anterior, posterior, and  lateral walls.  Endometrial curettings were also obtained.  There were no  signs of  perforation, active bleeding, or complications.  The single tooth  tenaculum and speculum were then removed.  The patient was taken out of the  dorsal lithotomy position.  Once alert, was transferred to the recovery room  in good  condition.  Sponge, instrument, and needle counts reported as  correct by circulating nurse.                                               Juluis Mire, M.D.   JSM/MEDQ  D:  08/26/2002  T:  08/26/2002  Job:  [id number]

## 2010-09-10 NOTE — Op Note (Signed)
   NAME:  Kimberly, Hunt                         ACCOUNT NO.:  [id number]   MEDICAL RECORD NO.:  [id number]                   PATIENT TYPE:  AMB   LOCATION:  DSC                                  FACILITY:  [facility]   PHYSICIAN:  Suzanna Obey, M.D.                    DATE OF BIRTH:  August 12, 1955   DATE OF PROCEDURE:  10/24/2002  DATE OF DISCHARGE:                                 OPERATIVE REPORT   PREOPERATIVE DIAGNOSIS:  Chronic tonsillitis.   POSTOPERATIVE DIAGNOSIS:  Chronic tonsillitis.   OPERATION PERFORMED:  Tonsillectomy.   SURGEON:  Suzanna Obey, M.D.   ANESTHESIA:  General endotracheal tube.   ESTIMATED BLOOD LOSS:  Less than 5mL.   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  She has had  medical therapy and it has failed.  She was informed of the risks and  benefits of the procedure including bleeding, infection, velopharyngeal  insufficiency, change in the voice, chronic pain, and risks of the  anesthetic.  All questions were answered and consent was obtained.   DESCRIPTION OF PROCEDURE:  The patient was taken to the operating room and  placed in supine position.  After adequate general endotracheal tube  anesthesia, she was placed in the Rose position and draped in the usual  sterile manner.  A Crowe-Davis mouth gag was inserted, retracted and  suspended from the Mayo stand.  The left tonsil was begun making a left  anterior tonsillar pillar incision identifying the capsule of the tonsil and  removing it with electrocautery dissection.  There was cryptic debris in the  tonsil but it was fairly small.  The right tonsil removed in the same  fashion.  Hemostasis was achieved with suction cautery.  The oropharynx was  irrigated with saline.  Hypopharynx, esophagus and stomach were suctioned  with the nasogastric tube.  The patient was awakened and brought to recovery  in stable condition,  counts correct.                                                Suzanna Obey, M.D.    Cordelia Pen  D:  10/24/2002  T:  10/24/2002  Job:  [id number]   cc:   Holley Bouche, M.D.  [address]  [place], [place] [address]  Fax: [phone]

## 2010-09-10 NOTE — H&P (Signed)
NAME:  Kimberly Hunt, Kimberly Hunt                         ACCOUNT NO.:  [id number]   MEDICAL RECORD NO.:  [id number]                   PATIENT TYPE:  AMB   LOCATION:  SDC                                  [facility]   PHYSICIAN:  Juluis Mire, M.D.                DATE OF BIRTH:  1956-04-16   DATE OF ADMISSION:  08/25/2002  DATE OF DISCHARGE:                                HISTORY & PHYSICAL   HISTORY:  The patient is a 55 year old gravida 1, para 0, AB-1 black female  who presents for hysteroscopy.  In relation to the present admission the patient is postmenopausal with her  last normal menstrual period of 3/03.  She had been doing well with no  abnormal uterine bleeding.  She subsequently experienced resumption of  abnormal bleeding at the end of 2/04.  Pelvic ultrasound was performed which  revealed a normal uterus.  She had three small fibroids.  Ovaries were  unremarkable.  It was noted though that she did have significantly increased  endometrial thickness.  A saline infusion ultrasound revealed echogenic  densities in the endometrial cavity of the posterior and anterior wall most  likely consistent with uterine polyps.  In view of this the patient now  presents for hysteroscopic evaluation.   ALLERGIES:  The patient has no known drug allergies.   MEDICATIONS:  1. Zoloft.  2. Hydrochlorothiazide for management of hypertension.  3. Glucosamine for management of type 2 diabetes.   PAST MEDICAL HISTORY:  She basically had the usual childhood diseases  without any significant sequela.  She was actively managed by [facility] for hypertension as well as glucose intolerance as noted above.   PAST SURGICAL HISTORY:  She had a previous diagnostic laparoscopy and  hysteroscopy.  Subsequently in '98 had a myomectomy.   OBSTETRIC HISTORY:  She has had one TAB.   FAMILY HISTORY:  Noncontributory.   SOCIAL HISTORY:  Revealed no tobacco or alcohol use at this time.  She has  had a past history of minimal tobacco use.   REVIEW OF SYSTEMS:  Noncontributory.   PHYSICAL EXAMINATION:  VITAL SIGNS: The patient is afebrile with stable  vital signs.  HEENT: Normocephalic.  Pupils are equal, round and reactive to light and  accommodation. Extraocular movements are intact.  Sclerae and conjunctivae  clear.  Oropharynx clear. No thyromegaly.  BREASTS: No discreet masses.  LUNGS: Clear.  CARDIAC: Regular rate and rhythm without murmurs or gallops.  ABDOMEN: Exam is benign, no mass, organomegaly or tenderness.  PELVIC: Normal external genitalia, vaginal mucosa is clear.  Cervix  unremarkable.  Uterus supple and of normal size, slightly irregular.  Adnexae unremarkable.  EXTREMITIES: Trace edema.  NEUROLOGIC EXAM: Grossly within normal limits.   IMPRESSION:  1. Postmenopausal bleeding with evidence of endometrial polyps.  2. Hypertension.  3. Glucose intolerance.   PLAN:  The patient to undergo hysteroscopic evaluation,  resection of polyps.  The risk of surgery have been discussed including the risk of infection.  The risk of hemorrhage that could necessitate transfusion with the risk of  AIDS or hepatitis.  Hemorrhage could also lead to hysterectomy.  There is  the risk of perforation that could lead to injury to adjacent organs  requiring diagnostic laparoscopy with possible exploratory laparotomy.  The  risk of deep venous thrombosis and pulmonary embolus.  The patient expressed her understanding of indications and risks.                                               Juluis Mire, M.D.    JSM/MEDQ  D:  08/26/2002  T:  08/26/2002  Job:  [id number]

## 2012-08-27 ENCOUNTER — Emergency Department (HOSPITAL_COMMUNITY)
Admission: EM | Admit: 2012-08-27 | Discharge: 2012-08-27 | Disposition: A | Discharge status: Home / Self Care | Payer: BC Managed Care – PPO | Attending: Emergency Medicine | Admitting: Emergency Medicine

## 2012-08-27 ENCOUNTER — Encounter (HOSPITAL_COMMUNITY): Payer: Self-pay | Admitting: Emergency Medicine

## 2012-08-27 ENCOUNTER — Emergency Department (HOSPITAL_COMMUNITY): Payer: BC Managed Care – PPO

## 2012-08-27 DIAGNOSIS — K859 Acute pancreatitis without necrosis or infection, unspecified: Secondary | ICD-10-CM

## 2012-08-27 DIAGNOSIS — Z79899 Other long term (current) drug therapy: Secondary | ICD-10-CM | POA: Insufficient documentation

## 2012-08-27 DIAGNOSIS — R1013 Epigastric pain: Secondary | ICD-10-CM | POA: Insufficient documentation

## 2012-08-27 DIAGNOSIS — I10 Essential (primary) hypertension: Secondary | ICD-10-CM | POA: Insufficient documentation

## 2012-08-27 DIAGNOSIS — E119 Type 2 diabetes mellitus without complications: Secondary | ICD-10-CM | POA: Insufficient documentation

## 2012-08-27 DIAGNOSIS — R112 Nausea with vomiting, unspecified: Secondary | ICD-10-CM | POA: Insufficient documentation

## 2012-08-27 HISTORY — DX: Type 2 diabetes mellitus without complications: E11.9

## 2012-08-27 HISTORY — DX: Essential (primary) hypertension: I10

## 2012-08-27 LAB — CBC WITH DIFFERENTIAL/PLATELET
Eosinophils Absolute: 0.2 10*3/uL (ref 0.0–0.7)
Eosinophils Relative: 2 % (ref 0–5)
Hemoglobin: 13.3 g/dL (ref 12.0–15.0)
Lymphs Abs: 3 10*3/uL (ref 0.7–4.0)
MCH: 29.6 pg (ref 26.0–34.0)
MCV: 83.3 fL (ref 78.0–100.0)
Monocytes Relative: 6 % (ref 3–12)
RBC: 4.5 MIL/uL (ref 3.87–5.11)

## 2012-08-27 LAB — URINALYSIS, ROUTINE W REFLEX MICROSCOPIC
Glucose, UA: NEGATIVE mg/dL
Hgb urine dipstick: NEGATIVE
Protein, ur: NEGATIVE mg/dL

## 2012-08-27 LAB — URINE CULTURE: Colony Count: NO GROWTH

## 2012-08-27 LAB — COMPREHENSIVE METABOLIC PANEL
Alkaline Phosphatase: 74 U/L (ref 39–117)
BUN: 16 mg/dL (ref 6–23)
Calcium: 9.9 mg/dL (ref 8.4–10.5)
GFR calc Af Amer: >90 mL/min (ref >90)
Glucose, Bld: 247 mg/dL — ABNORMAL HIGH (ref 70–99)
Total Protein: 7.7 g/dL (ref 6.0–8.3)

## 2012-08-27 LAB — URINE MICROSCOPIC-ADD ON

## 2012-08-27 LAB — LIPASE, BLOOD: Lipase: 116 U/L — ABNORMAL HIGH (ref 11–59)

## 2012-08-27 MED ORDER — OXYCODONE-ACETAMINOPHEN 5-325 MG PO TABS: 1.0000 | ORAL_TABLET | ORAL | Status: DC | PRN

## 2012-08-27 MED ORDER — HYDROMORPHONE HCL PF 1 MG/ML IJ SOLN
0.5000 mg | INTRAMUSCULAR | Status: AC
Start: 2012-08-27 — End: 2012-08-27
  Administered 2012-08-27: 0.5 mg via INTRAVENOUS
  Filled 2012-08-27 (×2): qty 1

## 2012-08-27 MED ORDER — ONDANSETRON 4 MG PO TBDP: 4.0000 mg | ORAL_TABLET | Freq: Four times a day (QID) | ORAL | Status: DC | PRN

## 2012-08-27 MED ORDER — IOHEXOL 300 MG/ML  SOLN
50.0000 mL | Freq: Once | INTRAMUSCULAR | Status: AC | PRN
  Administered 2012-08-27: 50 mL via ORAL

## 2012-08-27 MED ORDER — HYDROMORPHONE HCL PF 1 MG/ML IJ SOLN
1.0000 mg | Freq: Once | INTRAMUSCULAR | Status: AC
  Administered 2012-08-27: 1 mg via INTRAVENOUS
  Filled 2012-08-27: qty 1

## 2012-08-27 MED ORDER — IOHEXOL 300 MG/ML  SOLN
100.0000 mL | Freq: Once | INTRAMUSCULAR | Status: AC | PRN
  Administered 2012-08-27: 100 mL via INTRAVENOUS

## 2012-08-27 MED ORDER — ONDANSETRON HCL 4 MG/2ML IJ SOLN
4.0000 mg | Freq: Once | INTRAMUSCULAR | Status: AC
  Administered 2012-08-27: 4 mg via INTRAVENOUS
  Filled 2012-08-27: qty 2

## 2012-08-27 NOTE — ED Provider Notes (Signed)
History     CSN: [id number]  Arrival date & time 08/27/12  0217   First MD Initiated Contact with Patient 08/27/12 0320      Chief Complaint  Patient presents with  . Pancreatitis    HPI Kimberly Hunt is a 57 y.o. female with 2 week history of worsening abdominal pain that over the weekend was associated with nausea and vomiting, see your primary care physician and received a call saying that she has pancreatitis last Friday. She was prescribed pain medicines this is despite taking one Percocet about every 4 hours still continues to have 6-7/10 dull constant pain that radiates to the back in the epigastrium, associated with some low-level nausea, no vomiting, diarrhea, no fevers or chills. Patient has a history of hypertriglyceridemia, denies any alcohol use, any new medications or supplements or scorpion envenomation.   Past Medical History  Diagnosis Date  . Hypertension   . Diabetes mellitus without complication     Past Surgical History  Procedure Laterality Date  . Cholecystectomy      No family history on file.  History  Substance Use Topics  . Smoking status: Never Smoker   . Smokeless tobacco: Not on file  . Alcohol Use: No    OB History   Grav Para Term Preterm Abortions TAB SAB Ect Mult Living                  Review of Systems At least 10pt or greater review of systems completed and are negative except where specified in the HPI.  Allergies  Review of patient's allergies indicates no known allergies.  Home Medications   Current Outpatient Rx  Name  Route  Sig  Dispense  Refill  . lisinopril-hydrochlorothiazide (PRINZIDE,ZESTORETIC) 20-12.5 MG per tablet   Oral   Take 2 tablets by mouth daily.         . metFORMIN (GLUCOPHAGE) 850 MG tablet   Oral   Take 850 mg by mouth 2 (two) times daily with a meal.         . oxyCODONE-acetaminophen (PERCOCET) 7.5-325 MG per tablet   Oral   Take 1 tablet by mouth every 4 (four) hours as needed for pain.          Marland Kitchen sertraline (ZOLOFT) 100 MG tablet   Oral   Take 200 mg by mouth daily.           BP 218/95  Pulse 81  Temp(Src) 98.1 F (36.7 C) (Oral)  Resp 18  SpO2 100%  Physical Exam  Nursing notes reviewed.  Electronic medical record reviewed. VITAL SIGNS:   Filed Vitals:   08/27/12 0222 08/27/12 0230 08/27/12 0529 08/27/12 0556  BP: 196/87  218/95 179/86  Pulse: 81   76  Temp: 98.7 F (37.1 C)  98.1 F (36.7 C)   TempSrc: Oral Oral Oral   Resp: 14  18 18   SpO2: 99%  100% 100%   CONSTITUTIONAL: Awake, oriented, appears non-toxic HENT: Atraumatic, normocephalic, oral mucosa pink and moist, airway patent. Nares patent without drainage. External ears normal. EYES: Conjunctiva clear, EOMI, PERRLA NECK: Trachea midline, non-tender, supple CARDIOVASCULAR: Normal heart rate, Normal rhythm, No murmurs, rubs, gallops PULMONARY/CHEST: Clear to auscultation, no rhonchi, wheezes, or rales. Symmetrical breath sounds. Non-tender. ABDOMINAL: Non-distended, obese, soft, some moderate tenderness to palpation in the epigastrium on deep palpation without rebound or guarding.  BS normal. NEUROLOGIC: Non-focal, moving all four extremities, no gross sensory or motor deficits. EXTREMITIES: No clubbing, cyanosis, or  edema SKIN: Warm, Dry, No erythema, No rash  ED Course  Procedures (including critical care time)  Labs Reviewed  COMPREHENSIVE METABOLIC PANEL - Abnormal; Notable for the following:    Glucose, Bld 247 (*)    All other components within normal limits  LIPASE, BLOOD - Abnormal; Notable for the following:    Lipase 116 (*)    All other components within normal limits  URINALYSIS, ROUTINE W REFLEX MICROSCOPIC - Abnormal; Notable for the following:    APPearance CLOUDY (*)    Leukocytes, UA SMALL (*)    All other components within normal limits  URINE MICROSCOPIC-ADD ON - Abnormal; Notable for the following:    Squamous Epithelial / LPF FEW (*)    Bacteria, UA FEW (*)     All other components within normal limits  URINE CULTURE  CBC WITH DIFFERENTIAL   Ct Abdomen Pelvis W Contrast  08/27/2012  *RADIOLOGY REPORT*  Clinical Data: 58 year old female with abdominal pain and nausea. Possible pancreatitis.  CT ABDOMEN AND PELVIS WITH CONTRAST  Technique:  Multidetector CT imaging of the abdomen and pelvis was performed following the standard protocol during bolus administration of intravenous contrast.  Contrast: OMNIPAQUE IOHEXOL 300 MG/ML  SOLN  Comparison: Noncontrast CT abdomen and pelvis 02/26/2006.  Findings: Minor dependent atelectasis.  No pericardial or pleural effusion. No acute osseous abnormality identified.  No pelvic free fluid.  Uterus and adnexa within normal limits. Decompressed distal colon.  Hepatic flexure and transverse colon within normal limits.  Mild inflammation in the mesentery of the hepatic flexure, see pancreas and duodenum findings below. Otherwise right colon within normal limits.  Normal appendix.  Oral contrast has not yet reached the distal small bowel.  No dilated small bowel.  Oral contrast distending the stomach.  At the level of the pancreatic head there is inflammation, primarily involving the proximal duodenum.  Some involvement of the hepatic flexure colon mesentery.  Relative sparing of the porta hepatis.  The head of the pancreas appears somewhat enlarged but is stable in appearance since 2007 measuring about 4 cm in thickness (series 2 image 39). No main pancreatic ductal dilatation. Pancreatic enhancement is preserved.  Body and tail of the pancreas are within normal limits. Uncinate process may be mildly inflamed.  The gallbladder is surgically absent.  No CBD or intrahepatic ductal enlargement.  Mild peripancreatic lymphadenopathy, no upper abdominal lymph nodes are enlarged by CT criteria.  Diffuse decreased density in the liver.  Negative spleen, splenic vein, main portal vein, portal venous system, and adrenal glands. Major  arterial structures in the abdomen and pelvis are patent.  No abdominal free fluid.  Kidneys are stable with bilateral renal lobulation.  IMPRESSION: 1.  Inflammation at the head of the pancreas primarily affecting the proximal duodenum compatible with acute pancreatitis.  Mild secondary involvement of the hepatic flexure.  No free fluid or fluid collection. 2.  Somewhat enlarged appearance of pancreatic head, but stable since the 2007 noncontrast study.  Mild peripancreatic lymphadenopathy appears to be reactive.  No pancreatic duct or biliary ductal enlargement.  3.  Hepatic steatosis.   Original Report Authenticated By: Erskine Speed, M.D.      1. Pancreatitis       MDM  Patient presents with likely pancreatitis, recheck the lipase which was 116, also check a CT of the abdomen and pelvis showing some inflammation around the head of the pancreas and enlargement of the head of the pancreas, no pseudocyst seen. Labs are otherwise  unremarkable. No indication of infection, patient's vital signs are stable and within normal limits patient takes medication for.  I had a long discussion with the patient concerning outpatient versus inpatient therapy, I think she's eligible for outpatient therapy given her low level of lipase, and stable vital signs - patient would like to try therapy as an outpatient, we'll give her prescription for pain medicine, nausea medicine and she has an appointment to followup with Dr. Tiburcio Pea on Wednesday. Patient understands to return to the emergency department any worsening symptoms, inability to keep down pain medicine, any difficulties breathing or any other concerning symptoms.        Jones Skene, MD 08/27/12 [phone]

## 2012-08-27 NOTE — ED Notes (Signed)
The pts pain  Is better but not gone

## 2012-08-27 NOTE — ED Notes (Signed)
Iv started pain and nausea med given

## 2012-08-27 NOTE — ED Notes (Signed)
PT. RECEIVED A CALL FROM HER PCP ( DR. Tiburcio Pea) THAT SHE HAS PANCREATITIS LAST Friday , REPORTS MID ABDOMINAL PAIN WITH NAUSEA FOR SEVERAL DAYS

## 2012-08-27 NOTE — ED Notes (Signed)
Pt c.o abd pain for 1-2 weeks with intermittent nv.  No acute distress.  She on her i-pad sending an e-mail  To her work.

## 2013-12-10 ENCOUNTER — Emergency Department (HOSPITAL_COMMUNITY)
Admission: EM | Admit: 2013-12-10 | Discharge: 2013-12-10 | Disposition: A | Discharge status: Home / Self Care | Payer: BC Managed Care – PPO | Attending: Emergency Medicine | Admitting: Emergency Medicine

## 2013-12-10 ENCOUNTER — Encounter (HOSPITAL_COMMUNITY): Payer: Self-pay | Admitting: Emergency Medicine

## 2013-12-10 DIAGNOSIS — R000 Tachycardia, unspecified: Secondary | ICD-10-CM | POA: Diagnosis not present

## 2013-12-10 DIAGNOSIS — M542 Cervicalgia: Secondary | ICD-10-CM | POA: Diagnosis present

## 2013-12-10 DIAGNOSIS — I10 Essential (primary) hypertension: Secondary | ICD-10-CM | POA: Diagnosis not present

## 2013-12-10 DIAGNOSIS — G8929 Other chronic pain: Secondary | ICD-10-CM | POA: Diagnosis not present

## 2013-12-10 DIAGNOSIS — Z79899 Other long term (current) drug therapy: Secondary | ICD-10-CM | POA: Diagnosis not present

## 2013-12-10 DIAGNOSIS — E119 Type 2 diabetes mellitus without complications: Secondary | ICD-10-CM | POA: Insufficient documentation

## 2013-12-10 DIAGNOSIS — R51 Headache: Secondary | ICD-10-CM | POA: Insufficient documentation

## 2013-12-10 DIAGNOSIS — M62838 Other muscle spasm: Secondary | ICD-10-CM | POA: Insufficient documentation

## 2013-12-10 MED ORDER — DIAZEPAM 5 MG PO TABS
5.0000 mg | ORAL_TABLET | Freq: Once | ORAL | Status: AC
  Administered 2013-12-10: 5 mg via ORAL
  Filled 2013-12-10: qty 1

## 2013-12-10 MED ORDER — KETOROLAC TROMETHAMINE 30 MG/ML IJ SOLN
30.0000 mg | Freq: Once | INTRAMUSCULAR | Status: AC
  Administered 2013-12-10: 30 mg via INTRAMUSCULAR
  Filled 2013-12-10: qty 1

## 2013-12-10 MED ORDER — BUPIVACAINE HCL (PF) 0.5 % IJ SOLN
10.0000 mL | Freq: Once | INTRAMUSCULAR | Status: AC
Start: 2013-12-10 — End: 2013-12-10
  Administered 2013-12-10: 10 mL
  Filled 2013-12-10: qty 10

## 2013-12-10 MED ORDER — HYDROCODONE-ACETAMINOPHEN 5-325 MG PO TABS
1.0000 | ORAL_TABLET | Freq: Once | ORAL | Status: AC
  Administered 2013-12-10: 1 via ORAL
  Filled 2013-12-10: qty 1

## 2013-12-10 MED ORDER — BUPIVACAINE HCL 0.5 % IJ SOLN: 50.0000 mL | Freq: Once | INTRAMUSCULAR | Status: DC

## 2013-12-10 NOTE — Discharge Instructions (Signed)
Heat Therapy °Heat therapy can help make painful, stiff muscles and joints feel better. Do not use heat on new injuries. Wait at least 48 hours after an injury to use heat. Do not use heat when you have aches or pains right after an activity. If you still have pain 3 hours after stopping the activity, then you may use heat. °HOME CARE °Wet heat pack °· Soak a clean towel in warm water. Squeeze out the extra water. °· Put the warm, wet towel in a plastic bag. °· Place a thin, dry towel between your skin and the bag. °· Put the heat pack on the area for 5 minutes, and check your skin. Your skin may be pink, but it should not be red. °· Leave the heat pack on the area for 15 to 30 minutes. °· Repeat this every 2 to 4 hours while awake. Do not use heat while you are sleeping. °Warm water bath °· Fill a tub with warm water. °· Place the affected body part in the tub. °· Soak the area for 20 to 40 minutes. °· Repeat as needed. °Hot water bottle °· Fill the water bottle half full with hot water. °· Press out the extra air. Close the cap tightly. °· Place a dry towel between your skin and the bottle. °· Put the bottle on the area for 5 minutes, and check your skin. Your skin may be pink, but it should not be red. °· Leave the bottle on the area for 15 to 30 minutes. °· Repeat this every 2 to 4 hours while awake. °Electric heating pad °· Place a dry towel between your skin and the heating pad. °· Set the heating pad on low heat. °· Put the heating pad on the area for 10 minutes, and check your skin. Your skin may be pink, but it should not be red. °· Leave the heating pad on the area for 20 to 40 minutes. °· Repeat this every 2 to 4 hours while awake. °· Do not lie on the heating pad. °· Do not fall asleep while using the heating pad. °· Do not use the heating pad near water. °GET HELP RIGHT AWAY IF: °· You get blisters or red skin. °· Your skin is puffy (swollen), or you lose feeling (numbness) in the affected area. °· You  have any new problems. °· Your problems are getting worse. °· You have any questions or concerns. °If you have any problems, stop using heat therapy until you see your doctor. °MAKE SURE YOU: °· Understand these instructions. °· Will watch your condition. °· Will get help right away if you are not doing well or get worse. °Document Released: 07/04/2011 Document Reviewed: 06/04/2013 °[employer]® Patient Information ©2015 [employer]. This information is not intended to replace advice given to you by your health care provider. Make sure you discuss any questions you have with your health care provider. ° °

## 2013-12-10 NOTE — ED Notes (Signed)
Pt c/o neck pain x 10 days into right shoulder that is not improving; pt noted to be htn with hx of same

## 2013-12-10 NOTE — ED Provider Notes (Signed)
CSN: [id number]     Arrival date & time 12/10/13  1627 History   First MD Initiated Contact with Patient 12/10/13 1910     Chief Complaint  Patient presents with  . Neck Pain     (Consider location/radiation/quality/duration/timing/severity/associated sxs/prior Treatment) HPI  Kimberly Hunt is a 58 y.o. female with history of hypertension, diabetes, and chronic low back pain, presenting for 10 days of right-sided neck pain and shoulder pain. Patient states that she is taking pain medication at home without improvement of her  neck andshoulder pain. She denies history of injury, denies any focal neurologic deficits or muscle weakness. States the symptoms came on gradually and have persisted despite use of hydrocodone. States she's also taken Flexeril as well. Patient is not using any anti-inflammatories at this time. Patient denies any chest pain, blurred vision, or shortness of breath, however she has been hypertensive today as she has not yet gone to her pharmacy get her hypertension medication refills. Patient endorses mild headache, however has not had any slurred speech, gait changes, sensory changes or other associated focal neurologic findings.      Past Medical History  Diagnosis Date  . Hypertension   . Diabetes mellitus without complication    Past Surgical History  Procedure Laterality Date  . Cholecystectomy     History reviewed. No pertinent family history. History  Substance Use Topics  . Smoking status: Never Smoker   . Smokeless tobacco: Not on file  . Alcohol Use: No   OB History   Grav Para Term Preterm Abortions TAB SAB Ect Mult Living                 Review of Systems  Constitutional: Negative.   HENT: Negative.   Eyes: Negative.   Respiratory: Negative.   Cardiovascular: Negative.   Gastrointestinal: Negative.   Endocrine: Negative.   Genitourinary: Negative.   Musculoskeletal: Positive for neck pain.  Skin: Negative.   Allergic/Immunologic:  Negative.   Neurological: Negative.   Hematological: Negative.   Psychiatric/Behavioral: Negative.       Allergies  Shrimp  Home Medications   Prior to Admission medications   Medication Sig Start Date End Date Taking? Authorizing Provider  HYDROcodone-acetaminophen (NORCO/VICODIN) 5-325 MG per tablet Take 1 tablet by mouth every 6 (six) hours as needed for moderate pain.   Yes Historical Provider, MD  lisinopril-hydrochlorothiazide (PRINZIDE,ZESTORETIC) 20-12.5 MG per tablet Take 2 tablets by mouth daily.   Yes Historical Provider, MD  metFORMIN (GLUCOPHAGE) 850 MG tablet Take 850 mg by mouth 2 (two) times daily with a meal.   Yes Historical Provider, MD  sertraline (ZOLOFT) 100 MG tablet Take 200 mg by mouth daily.   Yes Historical Provider, MD   BP 211/100  Pulse 103  Temp(Src) 99.1 F (37.3 C) (Oral)  Resp 16  SpO2 98% Physical Exam  Nursing note and vitals reviewed. Constitutional: She is oriented to person, place, and time. She appears well-developed and well-nourished. No distress.  HENT:  Head: Normocephalic and atraumatic.  Right Ear: External ear normal.  Left Ear: External ear normal.  Nose: Nose normal.  Mouth/Throat: Oropharynx is clear and moist. No oropharyngeal exudate.  Eyes: Conjunctivae and EOM are normal. Pupils are equal, round, and reactive to light. Right eye exhibits no discharge. Left eye exhibits no discharge. No scleral icterus.  Neck: Normal range of motion. Neck supple. No JVD present. No tracheal deviation present. No thyromegaly present.  Cardiovascular: Regular rhythm, normal heart sounds and intact distal  pulses.  Tachycardia present.  Exam reveals no gallop and no friction rub.   No murmur heard. Pulmonary/Chest: Effort normal and breath sounds normal. No stridor. No respiratory distress. She has no wheezes. She has no rales. She exhibits no tenderness.  Abdominal: Soft. She exhibits no distension.  Musculoskeletal: Normal range of motion.  She exhibits tenderness. She exhibits no edema.       Cervical back: She exhibits tenderness, pain and spasm. She exhibits normal range of motion, no bony tenderness, no swelling, no edema, no deformity and no laceration.       Back:  Lymphadenopathy:    She has no cervical adenopathy.  Neurological: She is alert and oriented to person, place, and time. She has normal strength and normal reflexes. She is not disoriented. She displays no atrophy, no tremor and normal reflexes. No cranial nerve deficit or sensory deficit. She exhibits normal muscle tone. She displays no seizure activity. Coordination and gait normal. GCS eye subscore is 4. GCS verbal subscore is 5. GCS motor subscore is 6.  Skin: Skin is warm and dry. No rash noted. She is not diaphoretic. No erythema. No pallor.  Psychiatric: She has a normal mood and affect. Her behavior is normal. Judgment and thought content normal.    ED Course  Procedures   TRIGGER POINT INJECTION A trigger point injection was performed at the site of maximal tenderness in the R trapezius using 0.5% plain Bupivicaine. This was well tolerated, and followed by significant relief of pain.  (including critical care time) Labs Review Labs Reviewed - No data to display  Imaging Review No results found.   EKG Interpretation None      MDM   Final diagnoses:  Neck muscle spasm    Patient has palpable spasms in her right trapezius and the right side of her cervical spine. No history of trauma, no focal neurologic findings. Pain is reproducible with palpation. Patient has good range of motion of the neck otherwise. She is afebrile and otherwise normal vital signs with exception of her hypertension. Patient's headache consistent with her hypertension however no other acute findings concerning for end organ dysfunction.  Will treat patient's neck pain with a combination of anti-inflammatory, muscle relaxants, and narcotic. Will also give intramuscular  injection of bupivacaine in her areas of maximal tenderness.  Patient has improvement in her pain following administration medications. Blood pressure is slightly improved as well with pain treatment. Patient will go to a pharmacy to get her oral medications that she has her prescriptions for. Advised to continue her home medications including Flexeril and hydrocodone. Advised to take a short course of Motrin, but only briefly due to her other medical issues. Also advised to use heat, and stretching to help with her symptoms as well. Advised to follow with PCP for additional recommendations.   Patient given return precautions for neck pain.  Advised to return for worsening symptoms including chest pain, shortness of breath, severe headache, intractable nausea or vomiting, fever, or chills, inability to take medications, or other acute concerns.  Advised to follow up with PCP in 1 day.  Patient  in agreement with and expressed understanding of follow plan, plan of care, and return precautions.  All questions answered prior to discharge.  Patient was discharged in stable condition, ambulating without difficulty.  Patient care was discussed with my attending, Dr. Tawnya Crook.    Hoyle Sauer, MD 12/12/13 [phone]

## 2013-12-10 NOTE — ED Notes (Signed)
More comfortable

## 2013-12-10 NOTE — ED Notes (Signed)
The pt is c/o rt shoulder and pain in the  Base of her neck  Since Monday last week

## 2013-12-10 NOTE — ED Notes (Signed)
The pt has been out of her bp meds since monday

## 2013-12-10 NOTE — ED Notes (Signed)
Med given 

## 2013-12-12 NOTE — ED Provider Notes (Signed)
Medical screening examination/treatment/procedure(s) were conducted as a shared visit with resident-physician practitioner(s) and myself.  I personally evaluated the patient during the encounter.  Pt is a 58 y.o. female with pmhx as above presenting with worsening R sided neck and shoulder pain several days after running out of her pain meds. On PE, She is hypertensive, but in NAD. She has TTP of R paraspinal and trapezius muscles w/ spasm, though is feeling improved after PO narcotics and muscle relaxer's.  BP improved w/ treatment of pain. Trigger point injection supervised by Dr. Estanislado Spire w/ continued improvement of symptoms.    Kimberly Patches, MD 12/12/13 1123

## 2014-06-02 ENCOUNTER — Inpatient Hospital Stay (HOSPITAL_COMMUNITY): Payer: BC Managed Care – PPO

## 2014-06-02 ENCOUNTER — Observation Stay (HOSPITAL_COMMUNITY)
Admission: EM | Admit: 2014-06-02 | Discharge: 2014-06-04 | Disposition: A | Discharge status: Home / Self Care | Payer: BC Managed Care – PPO | Attending: Internal Medicine | Admitting: Internal Medicine

## 2014-06-02 ENCOUNTER — Encounter (HOSPITAL_COMMUNITY): Payer: Self-pay | Admitting: Emergency Medicine

## 2014-06-02 DIAGNOSIS — E1165 Type 2 diabetes mellitus with hyperglycemia: Secondary | ICD-10-CM | POA: Diagnosis not present

## 2014-06-02 DIAGNOSIS — E1100 Type 2 diabetes mellitus with hyperosmolarity without nonketotic hyperglycemic-hyperosmolar coma (NKHHC): Principal | ICD-10-CM | POA: Diagnosis present

## 2014-06-02 DIAGNOSIS — R110 Nausea: Secondary | ICD-10-CM | POA: Diagnosis not present

## 2014-06-02 DIAGNOSIS — Z79891 Long term (current) use of opiate analgesic: Secondary | ICD-10-CM | POA: Insufficient documentation

## 2014-06-02 DIAGNOSIS — Z79899 Other long term (current) drug therapy: Secondary | ICD-10-CM | POA: Insufficient documentation

## 2014-06-02 DIAGNOSIS — Z794 Long term (current) use of insulin: Secondary | ICD-10-CM | POA: Diagnosis not present

## 2014-06-02 DIAGNOSIS — I10 Essential (primary) hypertension: Secondary | ICD-10-CM | POA: Diagnosis not present

## 2014-06-02 DIAGNOSIS — F419 Anxiety disorder, unspecified: Secondary | ICD-10-CM | POA: Insufficient documentation

## 2014-06-02 DIAGNOSIS — R739 Hyperglycemia, unspecified: Secondary | ICD-10-CM | POA: Insufficient documentation

## 2014-06-02 DIAGNOSIS — R1084 Generalized abdominal pain: Secondary | ICD-10-CM | POA: Diagnosis present

## 2014-06-02 DIAGNOSIS — K859 Acute pancreatitis without necrosis or infection, unspecified: Secondary | ICD-10-CM | POA: Diagnosis present

## 2014-06-02 DIAGNOSIS — Z8719 Personal history of other diseases of the digestive system: Secondary | ICD-10-CM | POA: Diagnosis not present

## 2014-06-02 HISTORY — DX: Anxiety disorder, unspecified: F41.9

## 2014-06-02 HISTORY — DX: Acute pancreatitis without necrosis or infection, unspecified: K85.90

## 2014-06-02 LAB — COMPREHENSIVE METABOLIC PANEL
ALBUMIN: 4.7 g/dL (ref 3.5–5.2)
ALK PHOS: 111 U/L (ref 39–117)
ALT: 21 U/L (ref 0–35)
ANION GAP: 11 (ref 5–15)
AST: 17 U/L (ref 0–37)
BILIRUBIN TOTAL: 0.9 mg/dL (ref 0.3–1.2)
BUN: 25 mg/dL — ABNORMAL HIGH (ref 6–23)
CALCIUM: 10.2 mg/dL (ref 8.4–10.5)
CHLORIDE: 91 mmol/L — AB (ref 96–112)
CO2: 29 mmol/L (ref 19–32)
CREATININE: 0.97 mg/dL (ref 0.50–1.10)
GFR calc non Af Amer: 62 mL/min — ABNORMAL LOW (ref >90)
GFR, EST AFRICAN AMERICAN: 72 mL/min — AB (ref >90)
GLUCOSE: 700 mg/dL — AB (ref 70–99)
Potassium: 4.9 mmol/L (ref 3.5–5.1)
SODIUM: 131 mmol/L — AB (ref 135–145)
Total Protein: 8.4 g/dL — ABNORMAL HIGH (ref 6.0–8.3)

## 2014-06-02 LAB — CBC WITH DIFFERENTIAL/PLATELET
BASOS PCT: 0 % (ref 0–1)
Basophils Absolute: 0 10*3/uL (ref 0.0–0.1)
Eosinophils Absolute: 0.1 10*3/uL (ref 0.0–0.7)
Eosinophils Relative: 2 % (ref 0–5)
HCT: 46.1 % — ABNORMAL HIGH (ref 36.0–46.0)
Hemoglobin: 15.9 g/dL — ABNORMAL HIGH (ref 12.0–15.0)
LYMPHS ABS: 3.2 10*3/uL (ref 0.7–4.0)
LYMPHS PCT: 39 % (ref 12–46)
MCH: 29.3 pg (ref 26.0–34.0)
MCHC: 34.5 g/dL (ref 30.0–36.0)
MCV: 85.1 fL (ref 78.0–100.0)
MONO ABS: 0.5 10*3/uL (ref 0.1–1.0)
Monocytes Relative: 6 % (ref 3–12)
NEUTROS ABS: 4.4 10*3/uL (ref 1.7–7.7)
NEUTROS PCT: 53 % (ref 43–77)
Platelets: 475 10*3/uL — ABNORMAL HIGH (ref 150–400)
RBC: 5.42 MIL/uL — AB (ref 3.87–5.11)
RDW: 12.2 % (ref 11.5–15.5)
WBC: 8.3 10*3/uL (ref 4.0–10.5)

## 2014-06-02 LAB — URINALYSIS, ROUTINE W REFLEX MICROSCOPIC
BILIRUBIN URINE: NEGATIVE
Glucose, UA: >1000 mg/dL — AB
Hgb urine dipstick: NEGATIVE
KETONES UR: NEGATIVE mg/dL
LEUKOCYTES UA: NEGATIVE
NITRITE: NEGATIVE
PH: 5 (ref 5.0–8.0)
Protein, ur: NEGATIVE mg/dL
SPECIFIC GRAVITY, URINE: 1.039 — AB (ref 1.005–1.030)
Urobilinogen, UA: 0.2 mg/dL (ref 0.0–1.0)

## 2014-06-02 LAB — URINE MICROSCOPIC-ADD ON

## 2014-06-02 LAB — I-STAT TROPONIN, ED: Troponin i, poc: 0 ng/mL (ref 0.00–0.08)

## 2014-06-02 LAB — LIPASE, BLOOD: Lipase: 47 U/L (ref 11–59)

## 2014-06-02 MED ORDER — ONDANSETRON HCL 4 MG/2ML IJ SOLN: 4.0000 mg | Freq: Four times a day (QID) | INTRAMUSCULAR | Status: DC | PRN

## 2014-06-02 MED ORDER — SODIUM CHLORIDE 0.9 % IV SOLN
INTRAVENOUS | Status: DC
  Administered 2014-06-02: 3.4 [IU]/h via INTRAVENOUS

## 2014-06-02 MED ORDER — DEXTROSE-NACL 5-0.45 % IV SOLN: INTRAVENOUS | Status: DC

## 2014-06-02 MED ORDER — ONDANSETRON HCL 4 MG PO TABS: 4.0000 mg | ORAL_TABLET | Freq: Four times a day (QID) | ORAL | Status: DC | PRN

## 2014-06-02 MED ORDER — IOHEXOL 300 MG/ML  SOLN
50.0000 mL | Freq: Once | INTRAMUSCULAR | Status: AC | PRN
  Administered 2014-06-02: 50 mL via ORAL

## 2014-06-02 MED ORDER — HEPARIN SODIUM (PORCINE) 5000 UNIT/ML IJ SOLN
5000.0000 [IU] | Freq: Three times a day (TID) | INTRAMUSCULAR | Status: DC
  Administered 2014-06-02 – 2014-06-04 (×5): 5000 [IU] via SUBCUTANEOUS
  Filled 2014-06-02 (×7): qty 1

## 2014-06-02 MED ORDER — SODIUM CHLORIDE 0.9 % IV SOLN
INTRAVENOUS | Status: DC
  Filled 2014-06-02: qty 2.5

## 2014-06-02 MED ORDER — SODIUM CHLORIDE 0.9 % IJ SOLN
3.0000 mL | Freq: Two times a day (BID) | INTRAMUSCULAR | Status: DC
  Administered 2014-06-02: 3 mL via INTRAVENOUS

## 2014-06-02 MED ORDER — SODIUM CHLORIDE 0.9 % IV SOLN
INTRAVENOUS | Status: DC
  Administered 2014-06-02 – 2014-06-04 (×3): via INTRAVENOUS

## 2014-06-02 MED ORDER — SODIUM CHLORIDE 0.9 % IV BOLUS (SEPSIS)
1000.0000 mL | INTRAVENOUS | Status: AC
  Administered 2014-06-02: 1000 mL via INTRAVENOUS

## 2014-06-02 MED ORDER — POTASSIUM CHLORIDE 10 MEQ/100ML IV SOLN
10.0000 meq | INTRAVENOUS | Status: AC
  Administered 2014-06-02: 10 meq via INTRAVENOUS
  Filled 2014-06-02: qty 100

## 2014-06-02 MED ORDER — MORPHINE SULFATE 2 MG/ML IJ SOLN
2.0000 mg | INTRAMUSCULAR | Status: DC | PRN
  Administered 2014-06-02: 2 mg via INTRAVENOUS
  Filled 2014-06-02: qty 1

## 2014-06-02 MED ORDER — DEXTROSE 50 % IV SOLN: 25.0000 mL | INTRAVENOUS | Status: DC | PRN

## 2014-06-02 MED ORDER — DEXTROSE-NACL 5-0.45 % IV SOLN
INTRAVENOUS | Status: DC
  Administered 2014-06-03: 01:00:00 via INTRAVENOUS

## 2014-06-02 MED ORDER — IOHEXOL 300 MG/ML  SOLN
100.0000 mL | Freq: Once | INTRAMUSCULAR | Status: AC | PRN
  Administered 2014-06-02: 100 mL via INTRAVENOUS

## 2014-06-02 MED ORDER — PNEUMOCOCCAL VAC POLYVALENT 25 MCG/0.5ML IJ INJ
0.5000 mL | INJECTION | INTRAMUSCULAR | Status: AC
  Administered 2014-06-03: 0.5 mL via INTRAMUSCULAR
  Filled 2014-06-02 (×2): qty 0.5

## 2014-06-02 MED ORDER — INSULIN REGULAR BOLUS VIA INFUSION
0.0000 [IU] | Freq: Three times a day (TID) | INTRAVENOUS | Status: DC
  Filled 2014-06-02: qty 10

## 2014-06-02 MED ORDER — HYDROMORPHONE HCL 1 MG/ML IJ SOLN
0.5000 mg | Freq: Once | INTRAMUSCULAR | Status: AC
  Administered 2014-06-02: 0.5 mg via INTRAVENOUS
  Filled 2014-06-02: qty 1

## 2014-06-02 MED ORDER — ONDANSETRON HCL 4 MG/2ML IJ SOLN
4.0000 mg | Freq: Once | INTRAMUSCULAR | Status: AC
  Administered 2014-06-02: 4 mg via INTRAVENOUS
  Filled 2014-06-02: qty 2

## 2014-06-02 NOTE — ED Provider Notes (Signed)
CSN: [id number]     Arrival date & time 06/02/14  1543 History   First MD Initiated Contact with Patient 06/02/14 1745     Chief Complaint  Patient presents with  . Abdominal Pain  . Nausea     (Consider location/radiation/quality/duration/timing/severity/associated sxs/prior Treatment) Patient is a 59 y.o. female presenting with abdominal pain. The history is provided by the patient.  Abdominal Pain Pain location:  Epigastric Pain quality: aching   Pain severity:  Mild Onset quality:  Gradual Duration:  3 days Timing:  Constant Progression:  Waxing and waning Chronicity:  New Context comment:  Spontaneously Relieved by:  Nothing Worsened by:  Nothing tried Ineffective treatments:  None tried Associated symptoms: nausea and vomiting   Associated symptoms: no chest pain, no cough, no diarrhea, no dysuria, no fatigue, no fever, no hematuria and no shortness of breath   Vomiting:    Quality:  Stomach contents   Severity:  Moderate   Duration:  1 day   Timing:  Intermittent   Past Medical History  Diagnosis Date  . Hypertension   . Diabetes mellitus without complication   . Pancreatitis   . Anxiety    Past Surgical History  Procedure Laterality Date  . Cholecystectomy     No family history on file. History  Substance Use Topics  . Smoking status: Never Smoker   . Smokeless tobacco: Not on file  . Alcohol Use: No   OB History    No data available     Review of Systems  Constitutional: Negative for fever and fatigue.  HENT: Negative for congestion and drooling.   Eyes: Negative for pain.  Respiratory: Negative for cough and shortness of breath.   Cardiovascular: Negative for chest pain.  Gastrointestinal: Positive for nausea, vomiting and abdominal pain. Negative for diarrhea.  Genitourinary: Negative for dysuria and hematuria.  Musculoskeletal: Negative for back pain, gait problem and neck pain.  Skin: Negative for color change.  Neurological: Negative for  dizziness and headaches.  Hematological: Negative for adenopathy.  Psychiatric/Behavioral: Negative for behavioral problems.  All other systems reviewed and are negative.     Allergies  Shrimp  Home Medications   Prior to Admission medications   Medication Sig Start Date End Date Taking? Authorizing Provider  cyclobenzaprine (FLEXERIL) 5 MG tablet Take 5 mg by mouth 3 (three) times daily as needed for muscle spasms.   Yes Historical Provider, MD  HYDROcodone-acetaminophen (NORCO/VICODIN) 5-325 MG per tablet Take 1 tablet by mouth every 6 (six) hours as needed for moderate pain.   Yes Historical Provider, MD  lisinopril-hydrochlorothiazide (PRINZIDE,ZESTORETIC) 20-12.5 MG per tablet Take 2 tablets by mouth daily.   Yes Historical Provider, MD  PRESCRIPTION MEDICATION Inject 13 Units into the skin daily. Sample insulin pen from MD.   Yes Historical Provider, MD  sertraline (ZOLOFT) 100 MG tablet Take 200 mg by mouth daily.   Yes Historical Provider, MD  metFORMIN (GLUCOPHAGE) 850 MG tablet Take 850 mg by mouth 2 (two) times daily with a meal.    Historical Provider, MD   BP 133/93 mmHg  Pulse 113  Temp(Src) 98.1 F (36.7 C) (Oral)  Resp 18  SpO2 97% Physical Exam  Constitutional: She is oriented to person, place, and time. She appears well-developed and well-nourished.  HENT:  Head: Normocephalic and atraumatic.  Mouth/Throat: Oropharynx is clear and moist. No oropharyngeal exudate.  Eyes: Conjunctivae and EOM are normal. Pupils are equal, round, and reactive to light.  Neck: Normal range of motion.  Neck supple.  Cardiovascular: Regular rhythm, normal heart sounds and intact distal pulses.  Exam reveals no gallop and no friction rub.   No murmur heard. HR 113  Pulmonary/Chest: Effort normal and breath sounds normal. No respiratory distress. She has no wheezes.  Abdominal: Soft. Bowel sounds are normal. There is tenderness (mild ttp of epig area). There is no rebound and no  guarding.  Musculoskeletal: Normal range of motion. She exhibits no edema or tenderness.  Neurological: She is alert and oriented to person, place, and time.  Skin: Skin is warm and dry.  Psychiatric: She has a normal mood and affect. Her behavior is normal.  Nursing note and vitals reviewed.   ED Course  Procedures (including critical care time) Labs Review Labs Reviewed  COMPREHENSIVE METABOLIC PANEL - Abnormal; Notable for the following:    Sodium 131 (*)    Chloride 91 (*)    Glucose, Bld 700 (*)    BUN 25 (*)    Total Protein 8.4 (*)    GFR calc non Af Amer 62 (*)    GFR calc Af Amer 72 (*)    All other components within normal limits  CBC WITH DIFFERENTIAL/PLATELET - Abnormal; Notable for the following:    RBC 5.42 (*)    Hemoglobin 15.9 (*)    HCT 46.1 (*)    Platelets 475 (*)    All other components within normal limits  URINALYSIS, ROUTINE W REFLEX MICROSCOPIC - Abnormal; Notable for the following:    Specific Gravity, Urine 1.039 (*)    Glucose, UA >1000 (*)    All other components within normal limits  LIPASE, BLOOD  URINE MICROSCOPIC-ADD ON  COMPREHENSIVE METABOLIC PANEL  CBC WITH DIFFERENTIAL/PLATELET  HEMOGLOBIN A1C  I-STAT TROPOININ, ED    Imaging Review Ct Abdomen Pelvis W Contrast  06/02/2014   CLINICAL DATA:  Abdominal pain starting 3 days ago. Patient believes it is pancreatitis. Vomiting yesterday but none today. Nausea. History of hypertension, diabetes, pancreatitis.  EXAM: CT ABDOMEN AND PELVIS WITH CONTRAST  TECHNIQUE: Multidetector CT imaging of the abdomen and pelvis was performed using the standard protocol following bolus administration of intravenous contrast.  CONTRAST:  15mL OMNIPAQUE IOHEXOL 300 MG/ML SOLN, 161mL OMNIPAQUE IOHEXOL 300 MG/ML SOLN  COMPARISON:  08/27/2012  FINDINGS: Lung bases are clear.  Diffuse fatty infiltration of the liver. Asymmetric focal fatty infiltration around the falciform ligament. Surgical absence of the gallbladder.  No bile duct dilatation. The pancreas appears normal. No inflammatory changes to suggest pancreatitis. The spleen, adrenal glands, kidneys, abdominal aorta, inferior vena cava, and retroperitoneal lymph nodes are unremarkable. Stomach, small bowel, and colon appear normal for degree of distention. Scattered stool in the colon. Small umbilical hernia containing fat. No free air or free fluid in the abdomen.  Pelvis: Appendix is normal. No free or loculated pelvic fluid collections. Uterus and ovaries appear normal. No pelvic mass or lymphadenopathy. Scattered diverticula in the colon without evidence of diverticulitis. Bladder wall is not thickened. Mild degenerative changes in the spine. No destructive bone lesions.  IMPRESSION: Diffuse fatty infiltration of the liver. No focal acute process demonstrated in the abdomen or pelvis. No CT evidence of acute pancreatitis.   Electronically Signed   By: Lucienne Capers M.D.   On: 06/02/2014 20:47     EKG Interpretation None      MDM   Final diagnoses:  Hyperglycemia    6:27 PM 59 y.o. female with history of hypertension, diabetes, pancreatitis who presents with epigastric  pain and vomiting for the last 3 days. She notes most of the emesis was yesterday and appear to be stomach contents. Her abdominal pain is consistent with Rivas pink otitis flare she has had in the past. She states that she was recently started on insulin about 1 month ago. She denies any fevers. Blood sugar found to be 700 here. Patient is mildly tachycardic but vital signs are otherwise unremarkable. Will get pain control and start glucose stabilizer. Plan for admission.  Epig pain likely related to pancreatitis. Given hx of DM w/ vomiting and elev BS will start on glucostabilizer and admit to hospitalist.     Pamella Pert, MD 06/02/14 2330

## 2014-06-02 NOTE — ED Notes (Signed)
Moroni Notified

## 2014-06-02 NOTE — H&P (Signed)
Hospitalist Admission History and Physical  Patient name: Kimberly Hunt Medical record number: [id number] Date of birth: 07-08-1955 Age: 59 y.o. Gender: female  Primary Care Provider: Shirline Frees, MD  Chief Complaint: abd pain, hyperglycemia   History of Present Illness:This is a 60 y.o. year old female with significant past medical history of IDDM, pancreatitis, HTN  presenting with abd pain, hyperglycemia. Pt reports generalized abd pain over the past 2-3 days. Reports that pain is similar to previous pancreatitis flares in the past. No fevers or chills. + nausea. 1 episode of NBNB emesis today. No diarrhea. Believes episode was triggered by fatty foods (fried chicken, mac and cheese). Has not been checking sugars at home. Sxs progressively worsened over the past 2 days.  Presented to ER T 98.3, HR 110s, resp 10s, BP 120s-130s, satting 94% on RA. WBC 8.3, hgb 15.9, Cr 0.97. Glu 700. Bicarb 29, Na 131. LFTs WNL. Lipase 47. UA WNL. Pain initially severe. Pain much improved s/p dilaudid.    Assessment and Plan: Kimberly Hunt is a 59 y.o. year old female presenting with abd pain, hyperglycemia   Active Problems:   Pancreatitis   Abdominal pain, generalized   1- Abd Pain  -likely pancreatitis flare -lipase WNL -CT abd pending -pain control  -antiemetic  -ADAT -follow  2- Hyperglycemia  -likely secondary to above  -glucomander -A1C -hold orals  -follow  3- HTN -BP stable  -hold ACE/diuretic -dry on exam  -hydrate -titrate as clinically indicated  FEN/GI: ADAT. Noted pseudohyponatremia. Corrected sodium WNL. Follow.   Prophylaxis: sub q heparin  Disposition: pending further evaluation  Code Status:Full Code    Patient Active Problem List   Diagnosis Date Noted  . Pancreatitis 06/02/2014  . Abdominal pain, generalized 06/02/2014   Past Medical History: Past Medical History  Diagnosis Date  . Hypertension   . Diabetes mellitus without complication   .  Pancreatitis   . Anxiety     Past Surgical History: Past Surgical History  Procedure Laterality Date  . Cholecystectomy      Social History: History   Social History  . Marital Status: Single    Spouse Name: N/A    Number of Children: N/A  . Years of Education: N/A   Social History Main Topics  . Smoking status: Never Smoker   . Smokeless tobacco: None  . Alcohol Use: No  . Drug Use: No  . Sexual Activity: None   Other Topics Concern  . None   Social History Narrative    Family History: No family history on file.  Allergies: Allergies  Allergen Reactions  . Shrimp [Shellfish Allergy]     Hives     Current Facility-Administered Medications  Medication Dose Route Frequency Provider Last Rate Last Dose  . 0.9 %  sodium chloride infusion   Intravenous Continuous Shanda Howells, MD      . dextrose 5 %-0.45 % sodium chloride infusion   Intravenous Continuous Pamella Pert, MD      . dextrose 5 %-0.45 % sodium chloride infusion   Intravenous Continuous Shanda Howells, MD      . dextrose 50 % solution 25 mL  25 mL Intravenous PRN Shanda Howells, MD      . heparin injection 5,000 Units  5,000 Units Subcutaneous 3 times per day Shanda Howells, MD      . insulin regular (NOVOLIN R,HUMULIN R) 250 Units in sodium chloride 0.9 % 250 mL (1 Units/mL) infusion   Intravenous Continuous Pamella Pert, MD      .  insulin regular (NOVOLIN R,HUMULIN R) 250 Units in sodium chloride 0.9 % 250 mL (1 Units/mL) infusion   Intravenous Continuous Shanda Howells, MD      . Derrill Memo ON 06/03/2014] insulin regular bolus via infusion 0-10 Units  0-10 Units Intravenous TID WC Shanda Howells, MD      . morphine 2 MG/ML injection 2-4 mg  2-4 mg Intravenous Q2H PRN Shanda Howells, MD      . ondansetron [facility]) tablet 4 mg  4 mg Oral Q6H PRN Shanda Howells, MD       Or  . ondansetron [facility]) injection 4 mg  4 mg Intravenous Q6H PRN Shanda Howells, MD      . potassium chloride 10 mEq in 100 mL IVPB  10  mEq Intravenous STAT Pamella Pert, MD 100 mL/hr at 06/02/14 1836 10 mEq at 06/02/14 1836  . sodium chloride 0.9 % bolus 1,000 mL  1,000 mL Intravenous STAT Pamella Pert, MD 1,000 mL/hr at 06/02/14 1836 1,000 mL at 06/02/14 1836  . sodium chloride 0.9 % injection 3 mL  3 mL Intravenous Q12H Shanda Howells, MD       Current Outpatient Prescriptions  Medication Sig Dispense Refill  . cyclobenzaprine (FLEXERIL) 5 MG tablet Take 5 mg by mouth 3 (three) times daily as needed for muscle spasms.    Marland Kitchen HYDROcodone-acetaminophen (NORCO/VICODIN) 5-325 MG per tablet Take 1 tablet by mouth every 6 (six) hours as needed for moderate pain.    Marland Kitchen lisinopril-hydrochlorothiazide (PRINZIDE,ZESTORETIC) 20-12.5 MG per tablet Take 2 tablets by mouth daily.    Marland Kitchen PRESCRIPTION MEDICATION Inject 13 Units into the skin daily. Sample insulin pen from MD.    . sertraline (ZOLOFT) 100 MG tablet Take 200 mg by mouth daily.    . metFORMIN (GLUCOPHAGE) 850 MG tablet Take 850 mg by mouth 2 (two) times daily with a meal.     Review Of Systems: 12 point ROS negative except as noted above in HPI.  Physical Exam: Filed Vitals:   06/02/14 1845  BP: 129/87  Pulse: 110  Temp:   Resp: 20    General: alert and cooperative HEENT: PERRLA and extra ocular movement intact Heart: S1, S2 normal, no murmur, rub or gallop, regular rate and rhythm Lungs: clear to auscultation, no wheezes or rales and unlabored breathing Abdomen: + bowel sounds, mild abd TTP on exam  Extremities: extremities normal, atraumatic, no cyanosis or edema Skin:no rashes Neurology: normal without focal findings  Labs and Imaging: Lab Results  Component Value Date/Time   NA 131* 06/02/2014 04:37 PM   K 4.9 06/02/2014 04:37 PM   CL 91* 06/02/2014 04:37 PM   CO2 29 06/02/2014 04:37 PM   BUN 25* 06/02/2014 04:37 PM   CREATININE 0.97 06/02/2014 04:37 PM   GLUCOSE 700* 06/02/2014 04:37 PM   Lab Results  Component Value Date   WBC 8.3 06/02/2014    HGB 15.9* 06/02/2014   HCT 46.1* 06/02/2014   MCV 85.1 06/02/2014   PLT 475* 06/02/2014    No results found.         Shanda Howells MD  Pager: [phone]

## 2014-06-02 NOTE — ED Notes (Signed)
Patient back from CT.

## 2014-06-02 NOTE — ED Notes (Signed)
Pt c/o abd pain that started three days ago. Pt states she believes it it pancreatitis. Pt states that she had some vomiting yesterday but none today only nausea.

## 2014-06-03 DIAGNOSIS — R1084 Generalized abdominal pain: Secondary | ICD-10-CM

## 2014-06-03 DIAGNOSIS — E1165 Type 2 diabetes mellitus with hyperglycemia: Secondary | ICD-10-CM | POA: Diagnosis not present

## 2014-06-03 DIAGNOSIS — R739 Hyperglycemia, unspecified: Secondary | ICD-10-CM

## 2014-06-03 DIAGNOSIS — E1101 Type 2 diabetes mellitus with hyperosmolarity with coma: Secondary | ICD-10-CM

## 2014-06-03 DIAGNOSIS — E1100 Type 2 diabetes mellitus with hyperosmolarity without nonketotic hyperglycemic-hyperosmolar coma (NKHHC): Secondary | ICD-10-CM | POA: Diagnosis present

## 2014-06-03 LAB — CBC WITH DIFFERENTIAL/PLATELET
BASOS PCT: 0 % (ref 0–1)
Basophils Absolute: 0 10*3/uL (ref 0.0–0.1)
EOS ABS: 0.2 10*3/uL (ref 0.0–0.7)
Eosinophils Relative: 3 % (ref 0–5)
HCT: 38.6 % (ref 36.0–46.0)
Hemoglobin: 13.4 g/dL (ref 12.0–15.0)
Lymphocytes Relative: 53 % — ABNORMAL HIGH (ref 12–46)
Lymphs Abs: 3.8 10*3/uL (ref 0.7–4.0)
MCH: 29.5 pg (ref 26.0–34.0)
MCHC: 34.7 g/dL (ref 30.0–36.0)
MCV: 85 fL (ref 78.0–100.0)
Monocytes Absolute: 0.4 10*3/uL (ref 0.1–1.0)
Monocytes Relative: 6 % (ref 3–12)
NEUTROS ABS: 2.7 10*3/uL (ref 1.7–7.7)
NEUTROS PCT: 38 % — AB (ref 43–77)
PLATELETS: 370 10*3/uL (ref 150–400)
RBC: 4.54 MIL/uL (ref 3.87–5.11)
RDW: 12.1 % (ref 11.5–15.5)
WBC: 7.1 10*3/uL (ref 4.0–10.5)

## 2014-06-03 LAB — COMPREHENSIVE METABOLIC PANEL
ALK PHOS: 73 U/L (ref 39–117)
ALT: 16 U/L (ref 0–35)
ANION GAP: 6 (ref 5–15)
AST: 18 U/L (ref 0–37)
Albumin: 3.5 g/dL (ref 3.5–5.2)
BUN: 19 mg/dL (ref 6–23)
CHLORIDE: 104 mmol/L (ref 96–112)
CO2: 27 mmol/L (ref 19–32)
Calcium: 8.6 mg/dL (ref 8.4–10.5)
Creatinine, Ser: 0.6 mg/dL (ref 0.50–1.10)
GFR calc non Af Amer: >90 mL/min (ref >90)
GLUCOSE: 206 mg/dL — AB (ref 70–99)
POTASSIUM: 3.9 mmol/L (ref 3.5–5.1)
Sodium: 137 mmol/L (ref 135–145)
Total Bilirubin: 1 mg/dL (ref 0.3–1.2)
Total Protein: 6.4 g/dL (ref 6.0–8.3)

## 2014-06-03 LAB — GLUCOSE, CAPILLARY
GLUCOSE-CAPILLARY: 108 mg/dL — AB (ref 70–99)
GLUCOSE-CAPILLARY: 131 mg/dL — AB (ref 70–99)
GLUCOSE-CAPILLARY: 199 mg/dL — AB (ref 70–99)
GLUCOSE-CAPILLARY: 261 mg/dL — AB (ref 70–99)
GLUCOSE-CAPILLARY: 306 mg/dL — AB (ref 70–99)
Glucose-Capillary: 150 mg/dL — ABNORMAL HIGH (ref 70–99)
Glucose-Capillary: 161 mg/dL — ABNORMAL HIGH (ref 70–99)
Glucose-Capillary: 162 mg/dL — ABNORMAL HIGH (ref 70–99)
Glucose-Capillary: 168 mg/dL — ABNORMAL HIGH (ref 70–99)
Glucose-Capillary: 185 mg/dL — ABNORMAL HIGH (ref 70–99)
Glucose-Capillary: 211 mg/dL — ABNORMAL HIGH (ref 70–99)
Glucose-Capillary: 396 mg/dL — ABNORMAL HIGH (ref 70–99)
Glucose-Capillary: 167 mg/dL — ABNORMAL HIGH (ref 70–99)

## 2014-06-03 LAB — HEMOGLOBIN A1C
Hgb A1c MFr Bld: 15.1 % — ABNORMAL HIGH (ref 4.8–5.6)
Mean Plasma Glucose: 387 mg/dL

## 2014-06-03 MED ORDER — CYCLOBENZAPRINE HCL 5 MG PO TABS: 5.0000 mg | ORAL_TABLET | Freq: Three times a day (TID) | ORAL | Status: DC | PRN

## 2014-06-03 MED ORDER — LISINOPRIL-HYDROCHLOROTHIAZIDE 20-12.5 MG PO TABS: 2.0000 | ORAL_TABLET | Freq: Every day | ORAL | Status: DC

## 2014-06-03 MED ORDER — SERTRALINE HCL 100 MG PO TABS
200.0000 mg | ORAL_TABLET | Freq: Every day | ORAL | Status: DC
  Administered 2014-06-03 – 2014-06-04 (×2): 200 mg via ORAL
  Filled 2014-06-03 (×2): qty 2

## 2014-06-03 MED ORDER — INSULIN GLARGINE 100 UNIT/ML ~~LOC~~ SOLN: 30.0000 [IU] | Freq: Every day | SUBCUTANEOUS | Status: DC

## 2014-06-03 MED ORDER — LISINOPRIL 20 MG PO TABS
40.0000 mg | ORAL_TABLET | Freq: Every day | ORAL | Status: DC
  Administered 2014-06-03 – 2014-06-04 (×2): 40 mg via ORAL
  Filled 2014-06-03 (×2): qty 2

## 2014-06-03 MED ORDER — INSULIN ASPART 100 UNIT/ML ~~LOC~~ SOLN
3.0000 [IU] | Freq: Three times a day (TID) | SUBCUTANEOUS | Status: DC
  Administered 2014-06-03 – 2014-06-04 (×2): 3 [IU] via SUBCUTANEOUS

## 2014-06-03 MED ORDER — INSULIN GLARGINE 100 UNIT/ML ~~LOC~~ SOLN
10.0000 [IU] | Freq: Every day | SUBCUTANEOUS | Status: DC
Start: 2014-06-03 — End: 2014-06-03
  Administered 2014-06-03: 10 [IU] via SUBCUTANEOUS
  Filled 2014-06-03: qty 0.1

## 2014-06-03 MED ORDER — HYDROCHLOROTHIAZIDE 25 MG PO TABS
25.0000 mg | ORAL_TABLET | Freq: Every day | ORAL | Status: DC
  Administered 2014-06-03 – 2014-06-04 (×2): 25 mg via ORAL
  Filled 2014-06-03 (×2): qty 1

## 2014-06-03 MED ORDER — INSULIN ASPART 100 UNIT/ML ~~LOC~~ SOLN
0.0000 [IU] | Freq: Three times a day (TID) | SUBCUTANEOUS | Status: DC
  Administered 2014-06-03: 5 [IU] via SUBCUTANEOUS
  Administered 2014-06-04: 1 [IU] via SUBCUTANEOUS

## 2014-06-03 MED ORDER — INSULIN GLARGINE 100 UNIT/ML ~~LOC~~ SOLN
40.0000 [IU] | Freq: Every day | SUBCUTANEOUS | Status: DC
  Filled 2014-06-03: qty 0.4

## 2014-06-03 MED ORDER — INSULIN GLARGINE 100 UNIT/ML ~~LOC~~ SOLN
30.0000 [IU] | Freq: Once | SUBCUTANEOUS | Status: AC
  Administered 2014-06-03: 30 [IU] via SUBCUTANEOUS
  Filled 2014-06-03: qty 0.3

## 2014-06-03 MED ORDER — INSULIN GLARGINE 100 UNIT/ML ~~LOC~~ SOLN: 40.0000 [IU] | Freq: Every day | SUBCUTANEOUS | Status: DC

## 2014-06-03 MED ORDER — INSULIN ASPART 100 UNIT/ML ~~LOC~~ SOLN
0.0000 [IU] | Freq: Three times a day (TID) | SUBCUTANEOUS | Status: DC
  Administered 2014-06-03: 3 [IU] via SUBCUTANEOUS

## 2014-06-03 MED ORDER — HYDROCODONE-ACETAMINOPHEN 5-325 MG PO TABS
1.0000 | ORAL_TABLET | Freq: Four times a day (QID) | ORAL | Status: DC | PRN
  Administered 2014-06-03 (×2): 1 via ORAL
  Filled 2014-06-03 (×2): qty 1

## 2014-06-03 NOTE — Progress Notes (Signed)
UR completed 

## 2014-06-03 NOTE — Progress Notes (Signed)
Inpatient Diabetes Program Recommendations  AACE/ADA: New Consensus Statement on Inpatient Glycemic Control (2013)  Target Ranges:  Prepandial:   less than 140 mg/dL      Peak postprandial:   less than 180 mg/dL (1-2 hours)      Critically ill patients:  140 - 180 mg/dL    Results for JOEE, IOVINE (MRN [id number]) as of 06/03/2014 13:47  Ref. Range 06/02/2014 16:37  Glucose Latest Range: 70-99 mg/dL [facility])   Results for JAKIRA, MCFADDEN (MRN [id number]) as of 06/03/2014 13:47  Ref. Range 06/03/2014 04:03 06/03/2014 05:10 06/03/2014 06:08 06/03/2014 07:57 06/03/2014 11:56  Glucose-Capillary Latest Range: 70-99 mg/dL 199 (H) 150 (H) 108 (H) 168 (H) 211 (H)     Chief Complaint: Abd pain, Hyperglycemia   History of Present Illness:  59 y.o. year old female with significant past medical history of IDDM, pancreatitis, HTN presenting with abd pain, hyperglycemia. Pt reports generalized abd pain over the past 2-3 days. Reports that pain is similar to previous pancreatitis flares in the past. No fevers or chills. + nausea. 1 episode of NBNB emesis today. No diarrhea. Believes episode was triggered by fatty foods (fried chicken, mac and cheese). Has not been checking sugars at home. Sxs progressively worsened over the past 2 days.   Home DM Meds: Toujeo 13 units QAM (started one month ago)       Metformin 850 mg bid (stopped one month ago)   Current Orders: Lantus 10 units daily     Novolog Moderate SSI   **Patient was given 10 units Lantus at 4:30am to transition off IV insulin drip.  **A1c pending.   **Spoke with patient about her home DM regimen.  Patient told me she saw her PCP Dr. Shirline Frees with [facility] about one month ago.  At that visit, Dr. Kenton Kingfisher stopped patient's Metformin and started her on Toujeo insulin- 13 units QAM.  Patient told me her PCP started her on Toujeo insulin b/c her A1c was 14% at her last PCP visit.  Patient told me she had an appointment with Dr. Delrae Rend with [facility] today for further DM management.  Told patient I would call Dr. Cindra Eves office to cancel her appointment for today since she is still in the hospital.  Encouraged patient to reschedule her appointment with Dr. Buddy Duty asap after d/c from the hospital.  **Patient told me she is only checking her CBGs once daily at home due to cost.  Encouraged patient to make sure she gets a variety of CBG readings per week so she can share these readings with Dr. Buddy Duty.  Educated patient to either check before a meal or two hours after the 1st bite of a meal.  Reviewed CBG goals with patient and explained to patient that she may need insulin long-term to help better manage her CBGs at home.    **Patient was interested in going to Outpatient DM Education classes after d/c.  Will place referral for OP DM education classes.   Will follow Wyn Quaker RN, MSN, CDE Diabetes Coordinator Inpatient Diabetes Program Team Pager: [phone] (8a-10p)

## 2014-06-03 NOTE — Progress Notes (Signed)
[facility] PROGRESS NOTE   Assessment/Plan: Hyperglycemic non-ketosis state: - Continue IV insulin, glucose below 250 will change his IV fluids to D5 continue IV insulin and start him on long-acting insulin and sliding scale insulin once he is off the glucometer. - At home she is not on metformin she is on insulin. - A1c is pending. Allow diet.   Abdominal pain, generalized: - Now resolved, lipase was normal CT scan negative, most likely due to hyperglycemia. - Physical exam is benign, she doesn't have a white count or fever.   Code Status: Full code Family Communication: None  Disposition Plan: Inpatient   Consultants:  None  Procedures:  The scan of the abdomen and pelvis  Antibiotics:  None  HPI/Subjective: She relates her abdominal pain is better she is tolerating her diet.  Objective: Filed Vitals:   06/02/14 1845 06/02/14 2016 06/02/14 2100 06/03/14 0428  BP: 129/87 137/77 140/74 128/62  Pulse: 110 107 95 87  Temp:   98.5 F (36.9 C) 98.1 F (36.7 C)  TempSrc:   Oral Oral  Resp: 20 18 16 16   Height:   5\' 4"  (1.626 m)   Weight:   72.9 kg (160 lb 11.5 oz)   SpO2: 94% 98% 100% 97%    Intake/Output Summary (Last 24 hours) at 06/03/14 1152 Last data filed at 06/03/14 0100  Gross per 24 hour  Intake      0 ml  Output    500 ml  Net   -500 ml   Filed Weights   06/02/14 2100  Weight: 72.9 kg (160 lb 11.5 oz)    Exam:  General: Alert, awake, oriented x3, in no acute distress.  HEENT: No bruits, no goiter.  Heart: Regular rate and rhythm. Lungs: Good air movement, clear Abdomen: Soft, nontender, nondistended, positive bowel sounds.  Neuro: Grossly intact, nonfocal.   Data Reviewed: Basic Metabolic Panel:  Recent Labs Lab 06/02/14 1637 06/03/14 0415  NA 131* 137  K 4.9 3.9  CL 91* 104  CO2 29 27  GLUCOSE 700* 206*  BUN 25* 19  CREATININE 0.97 0.60  CALCIUM 10.2 8.6   Liver Function Tests:  Recent Labs Lab 06/02/14 1637  06/03/14 0415  AST 17 18  ALT 21 16  ALKPHOS 111 73  BILITOT 0.9 1.0  PROT 8.4* 6.4  ALBUMIN 4.7 3.5    Recent Labs Lab 06/02/14 1637  LIPASE 47   No results for input(s): AMMONIA in the last 168 hours. CBC:  Recent Labs Lab 06/02/14 1637 06/03/14 0415  WBC 8.3 7.1  NEUTROABS 4.4 2.7  HGB 15.9* 13.4  HCT 46.1* 38.6  MCV 85.1 85.0  PLT 475* 370   Cardiac Enzymes: No results for input(s): CKTOTAL, CKMB, CKMBINDEX, TROPONINI in the last 168 hours. BNP (last 3 results) No results for input(s): BNP in the last 8760 hours.  ProBNP (last 3 results) No results for input(s): PROBNP in the last 8760 hours.  CBG: No results for input(s): GLUCAP in the last 168 hours.  No results found for this or any previous visit (from the past 240 hour(s)).   Studies: Ct Abdomen Pelvis W Contrast  06/02/2014   CLINICAL DATA:  Abdominal pain starting 3 days ago. Patient believes it is pancreatitis. Vomiting yesterday but none today. Nausea. History of hypertension, diabetes, pancreatitis.  EXAM: CT ABDOMEN AND PELVIS WITH CONTRAST  TECHNIQUE: Multidetector CT imaging of the abdomen and pelvis was performed using the standard protocol following bolus administration of intravenous contrast.  CONTRAST:  3mL OMNIPAQUE IOHEXOL 300 MG/ML SOLN, 176mL OMNIPAQUE IOHEXOL 300 MG/ML SOLN  COMPARISON:  08/27/2012  FINDINGS: Lung bases are clear.  Diffuse fatty infiltration of the liver. Asymmetric focal fatty infiltration around the falciform ligament. Surgical absence of the gallbladder. No bile duct dilatation. The pancreas appears normal. No inflammatory changes to suggest pancreatitis. The spleen, adrenal glands, kidneys, abdominal aorta, inferior vena cava, and retroperitoneal lymph nodes are unremarkable. Stomach, small bowel, and colon appear normal for degree of distention. Scattered stool in the colon. Small umbilical hernia containing fat. No free air or free fluid in the abdomen.  Pelvis: Appendix is  normal. No free or loculated pelvic fluid collections. Uterus and ovaries appear normal. No pelvic mass or lymphadenopathy. Scattered diverticula in the colon without evidence of diverticulitis. Bladder wall is not thickened. Mild degenerative changes in the spine. No destructive bone lesions.  IMPRESSION: Diffuse fatty infiltration of the liver. No focal acute process demonstrated in the abdomen or pelvis. No CT evidence of acute pancreatitis.   Electronically Signed   By: Lucienne Capers M.D.   On: 06/02/2014 20:47    Scheduled Meds: . heparin  5,000 Units Subcutaneous 3 times per day  . insulin aspart  0-15 Units Subcutaneous TID WC  . insulin glargine  10 Units Subcutaneous Daily  . sodium chloride  3 mL Intravenous Q12H   Continuous Infusions: . sodium chloride 125 mL/hr at 06/03/14 0407     Charlynne Cousins  [facility] Pager [phone]. If 8PM-8AM, please contact night-coverage at [email or web address], password [facility] 06/03/2014, 11:52 AM  LOS: 1 day

## 2014-06-03 NOTE — Progress Notes (Signed)
INITIAL NUTRITION ASSESSMENT  DOCUMENTATION CODES Per approved criteria  -Not Applicable   INTERVENTION: Provided "Pancreatitis Nutrition Therapy" education and handout Encouraged PO intake  NUTRITION DIAGNOSIS: Increased nutrient needs related to pancreatitis as evidenced by estimated nutritional needs.   Goal: Pt to meet >/= 90% of their estimated nutrition needs   Monitor:  PO intake, weight, labs, I/O's  Reason for Assessment: Pt identified as at nutrition risk on the Malnutrition Screen Tool  Admitting Dx: abdominal pain, hyperglycemia  ASSESSMENT: 59 y.o. year old female with significant past medical history of IDDM, pancreatitis, HTN presenting with abd pain, hyperglycemia. Pt reports generalized abd pain over the past 2-3 days. Reports that pain is similar to previous pancreatitis flares in the past. Believes episode was triggered by fatty foods (fried chicken, mac and cheese).   Pt reports feeling better, PTA she was feeling dehydrated and achy.  States that she weighed 200 lb in April 2015 but began to lose weight d/t pancreatitis. Pt estimates she has lost 5 more lb this month. (20% weight loss x 10 months, significant for time frame). Pt reports eating high fat foods  PTA.   Provided pancreatitis nutrition therapy education and handout. Discussed blood sugar control and controlling portions of carbohydrate foods. Encouraged pt to check blood sugars after discharge.  Nutrition focused physical exam shows no sign of depletion of muscle mass or body fat.  Labs reviewed: Glucose 206  Height: Ht Readings from Last 1 Encounters:  06/02/14 5\' 4"  (1.626 m)    Weight: Wt Readings from Last 1 Encounters:  06/02/14 160 lb 11.5 oz (72.9 kg)    Ideal Body Weight: 120 lb  % Ideal Body Weight: 133%  Wt Readings from Last 10 Encounters:  06/02/14 160 lb 11.5 oz (72.9 kg)    Usual Body Weight: 200 lb -per pt  % Usual Body Weight: 80%  BMI:  Body mass index is  27.57 kg/(m^2).  Estimated Nutritional Needs: Kcal: 1850-2050 Protein: 105-115g Fluid: 1.9L/day  Skin: intact  Diet Order: Diet Carb Modified  EDUCATION NEEDS: -Education needs addressed   Intake/Output Summary (Last 24 hours) at 06/03/14 0953 Last data filed at 06/03/14 0100  Gross per 24 hour  Intake      0 ml  Output    500 ml  Net   -500 ml    Last BM: 2/8  Labs:   Recent Labs Lab 06/02/14 1637 06/03/14 0415  NA 131* 137  K 4.9 3.9  CL 91* 104  CO2 29 27  BUN 25* 19  CREATININE 0.97 0.60  CALCIUM 10.2 8.6  GLUCOSE 700* 206*    CBG (last 3)  No results for input(s): GLUCAP in the last 72 hours.  Scheduled Meds: . heparin  5,000 Units Subcutaneous 3 times per day  . insulin aspart  0-15 Units Subcutaneous TID WC  . insulin glargine  10 Units Subcutaneous Daily  . sodium chloride  3 mL Intravenous Q12H    Continuous Infusions: . sodium chloride 125 mL/hr at 06/03/14 0407    Past Medical History  Diagnosis Date  . Hypertension   . Diabetes mellitus without complication   . Pancreatitis   . Anxiety     Past Surgical History  Procedure Laterality Date  . Cholecystectomy      Clayton Bibles, MS, RD, LDN Pager: [phone] After Hours Pager: [phone]

## 2014-06-04 DIAGNOSIS — E1165 Type 2 diabetes mellitus with hyperglycemia: Secondary | ICD-10-CM | POA: Insufficient documentation

## 2014-06-04 LAB — GLUCOSE, CAPILLARY
GLUCOSE-CAPILLARY: 106 mg/dL — AB (ref 70–99)
Glucose-Capillary: 123 mg/dL — ABNORMAL HIGH (ref 70–99)

## 2014-06-04 LAB — CBC WITH DIFFERENTIAL/PLATELET
BASOS ABS: 0 10*3/uL (ref 0.0–0.1)
Basophils Relative: 0 % (ref 0–1)
EOS PCT: 4 % (ref 0–5)
Eosinophils Absolute: 0.2 10*3/uL (ref 0.0–0.7)
HCT: 35.1 % — ABNORMAL LOW (ref 36.0–46.0)
Hemoglobin: 12 g/dL (ref 12.0–15.0)
LYMPHS ABS: 2.3 10*3/uL (ref 0.7–4.0)
Lymphocytes Relative: 44 % (ref 12–46)
MCH: 29.1 pg (ref 26.0–34.0)
MCHC: 34.2 g/dL (ref 30.0–36.0)
MCV: 85 fL (ref 78.0–100.0)
MONO ABS: 0.4 10*3/uL (ref 0.1–1.0)
Monocytes Relative: 7 % (ref 3–12)
Neutro Abs: 2.4 10*3/uL (ref 1.7–7.7)
Neutrophils Relative %: 45 % (ref 43–77)
Platelets: 304 10*3/uL (ref 150–400)
RBC: 4.13 MIL/uL (ref 3.87–5.11)
RDW: 12 % (ref 11.5–15.5)
WBC: 5.3 10*3/uL (ref 4.0–10.5)

## 2014-06-04 LAB — COMPREHENSIVE METABOLIC PANEL
ALT: 18 U/L (ref 0–35)
AST: 21 U/L (ref 0–37)
Albumin: 3.4 g/dL — ABNORMAL LOW (ref 3.5–5.2)
Alkaline Phosphatase: 63 U/L (ref 39–117)
Anion gap: 8 (ref 5–15)
BUN: 16 mg/dL (ref 6–23)
CALCIUM: 8.4 mg/dL (ref 8.4–10.5)
CO2: 25 mmol/L (ref 19–32)
Chloride: 106 mmol/L (ref 96–112)
Creatinine, Ser: 0.55 mg/dL (ref 0.50–1.10)
GFR calc Af Amer: >90 mL/min (ref >90)
GFR calc non Af Amer: >90 mL/min (ref >90)
Glucose, Bld: 151 mg/dL — ABNORMAL HIGH (ref 70–99)
Potassium: 4.2 mmol/L (ref 3.5–5.1)
Sodium: 139 mmol/L (ref 135–145)
Total Bilirubin: 0.5 mg/dL (ref 0.3–1.2)
Total Protein: 6 g/dL (ref 6.0–8.3)

## 2014-06-04 LAB — HEMOGLOBIN A1C
Hgb A1c MFr Bld: 14.9 % — ABNORMAL HIGH (ref 4.8–5.6)
Mean Plasma Glucose: 381 mg/dL

## 2014-06-04 MED ORDER — INSULIN GLARGINE 100 UNIT/ML SOLOSTAR PEN: 30.0000 [IU] | PEN_INJECTOR | Freq: Every day | SUBCUTANEOUS | Status: DC

## 2014-06-04 MED ORDER — LIVING WELL WITH DIABETES BOOK
Freq: Once | Status: AC
  Administered 2014-06-04: 1
  Filled 2014-06-04: qty 1

## 2014-06-04 MED ORDER — BLOOD GLUCOSE MONITOR KIT: PACK | Status: AC

## 2014-06-04 MED ORDER — INSULIN GLARGINE 100 UNIT/ML ~~LOC~~ SOLN
30.0000 [IU] | Freq: Every day | SUBCUTANEOUS | Status: DC
  Administered 2014-06-04: 30 [IU] via SUBCUTANEOUS
  Filled 2014-06-04: qty 0.3

## 2014-06-04 NOTE — Discharge Summary (Signed)
[facility]  Physician Discharge Summary   Patient ID: Kimberly Hunt MRN: [id number] DOB/AGE: [birth year] 59 y.o.  Admit date: 06/02/2014 Discharge date: 06/04/2014  PCP: Shirline Frees, MD  DISCHARGE DIAGNOSES:  Active Problems:   Pancreatitis   Abdominal pain, generalized   Hyperosmolar non-ketotic state in patient with type 2 diabetes mellitus   Type 2 diabetes mellitus with hyperglycemia   RECOMMENDATIONS FOR OUTPATIENT FOLLOW UP: 1. Discharged on Lantus. Patient to discuss her insulin regimen further with her doctor.  DISCHARGE CONDITION: fair  Diet recommendation: Modified carbohydrate  Filed Weights   06/02/14 2100  Weight: 72.9 kg (160 lb 11.5 oz)    INITIAL HISTORY: 59 year old African-American female presented with abdominal pain and high sugar. She was admitted for further management.  Consultations:  None  Procedures:  None  HOSPITAL COURSE:   Hyperglycemic non-ketosis state: Patient was placed on an insulin infusion. She slowly corrected. She was changed over to Lantus. She was apparently placed onToujeo insulin, which is a form of glargine. She tells me that she has been taking her medications regularly but has not been checking her blood sugars at home. She has been discharged on Lantus. He's been asked to check her blood sugars regularly. HbA1c was 14.9. She needs to follow-up with her providers closely.  Abdominal pain, generalized: Etiology is unclear. CT scan was unremarkable. Lipase was normal. Pain resolved. She was tolerating orally.  Patient was requesting prescriptions for her home pain medications. I have told her to ask her PCP for the same.  Overall, patient was stable this morning. She was seen to go home. She was discharged.  PERTINENT LABS:  The results of significant diagnostics from this hospitalization (including imaging, microbiology, ancillary and laboratory) are listed below for reference.     Labs: Basic  Metabolic Panel:  Recent Labs Lab 06/02/14 1637 06/03/14 0415 06/04/14 0545  NA 131* 137 139  K 4.9 3.9 4.2  CL 91* 104 106  CO2 29 27 25   GLUCOSE 700* 206* 151*  BUN 25* 19 16  CREATININE 0.97 0.60 0.55  CALCIUM 10.2 8.6 8.4   Liver Function Tests:  Recent Labs Lab 06/02/14 1637 06/03/14 0415 06/04/14 0545  AST 17 18 21   ALT 21 16 18   ALKPHOS 111 73 63  BILITOT 0.9 1.0 0.5  PROT 8.4* 6.4 6.0  ALBUMIN 4.7 3.5 3.4*    Recent Labs Lab 06/02/14 1637  LIPASE 47   CBC:  Recent Labs Lab 06/02/14 1637 06/03/14 0415 06/04/14 0545  WBC 8.3 7.1 5.3  NEUTROABS 4.4 2.7 2.4  HGB 15.9* 13.4 12.0  HCT 46.1* 38.6 35.1*  MCV 85.1 85.0 85.0  PLT 475* 370 304    CBG:  Recent Labs Lab 06/03/14 1156 06/03/14 1659 06/03/14 2143 06/04/14 0808 06/04/14 1219  GLUCAP 211* 261* 167* 123* 106*     IMAGING STUDIES Ct Abdomen Pelvis W Contrast  06/02/2014   CLINICAL DATA:  Abdominal pain starting 3 days ago. Patient believes it is pancreatitis. Vomiting yesterday but none today. Nausea. History of hypertension, diabetes, pancreatitis.  EXAM: CT ABDOMEN AND PELVIS WITH CONTRAST  TECHNIQUE: Multidetector CT imaging of the abdomen and pelvis was performed using the standard protocol following bolus administration of intravenous contrast.  CONTRAST:  38m OMNIPAQUE IOHEXOL 300 MG/ML SOLN, 1036mOMNIPAQUE IOHEXOL 300 MG/ML SOLN  COMPARISON:  08/27/2012  FINDINGS: Lung bases are clear.  Diffuse fatty infiltration of the liver. Asymmetric focal fatty infiltration around the falciform ligament. Surgical absence of the gallbladder.  No bile duct dilatation. The pancreas appears normal. No inflammatory changes to suggest pancreatitis. The spleen, adrenal glands, kidneys, abdominal aorta, inferior vena cava, and retroperitoneal lymph nodes are unremarkable. Stomach, small bowel, and colon appear normal for degree of distention. Scattered stool in the colon. Small umbilical hernia containing  fat. No free air or free fluid in the abdomen.  Pelvis: Appendix is normal. No free or loculated pelvic fluid collections. Uterus and ovaries appear normal. No pelvic mass or lymphadenopathy. Scattered diverticula in the colon without evidence of diverticulitis. Bladder wall is not thickened. Mild degenerative changes in the spine. No destructive bone lesions.  IMPRESSION: Diffuse fatty infiltration of the liver. No focal acute process demonstrated in the abdomen or pelvis. No CT evidence of acute pancreatitis.   Electronically Signed   By: Lucienne Capers M.D.   On: 06/02/2014 20:47    DISCHARGE EXAMINATION: Filed Vitals:   06/03/14 0428 06/03/14 1353 06/03/14 2152 06/04/14 0627  BP: 128/62 162/88 146/74 141/76  Pulse: 87 93 102 76  Temp: 98.1 F (36.7 C) 98.7 F (37.1 C) 98.4 F (36.9 C) 98.3 F (36.8 C)  TempSrc: Oral Oral Oral Oral  Resp: 16 16 16 16   Height:      Weight:      SpO2: 97% 99% 98% 99%   General appearance: alert, cooperative, appears stated age and no distress Resp: clear to auscultation bilaterally Cardio: regular rate and rhythm, S1, S2 normal, no murmur, click, rub or gallop GI: soft, non-tender; bowel sounds normal; no masses,  no organomegaly  DISPOSITION: Home  Discharge Instructions    Ambulatory referral to Nutrition and Diabetic Education    Complete by:  As directed   Per pt, last A1c with her PCP was 14%.  Started on Toujeo insulin about 1 month ago by her PCP (Dr. Shirline Frees).  Pt has never had formal OP DM education classes before.  Thanks!     Call MD for:  extreme fatigue    Complete by:  As directed      Call MD for:  persistant dizziness or light-headedness    Complete by:  As directed      Call MD for:  persistant nausea and vomiting    Complete by:  As directed      Call MD for:  severe uncontrolled pain    Complete by:  As directed      Call MD for:  temperature >100.4    Complete by:  As directed      Diet Carb Modified    Complete  by:  As directed      Discharge instructions    Complete by:  As directed   Please check your blood glucose as discussed. Call your doctor if levels stay above 180 consistently.     Increase activity slowly    Complete by:  As directed            ALLERGIES:  Allergies  Allergen Reactions  . Shrimp [Shellfish Allergy]     Hives       Discharge Medication List as of 06/04/2014 11:24 AM    START taking these medications   Details  blood glucose meter kit and supplies KIT Dispense based on patient and insurance preference. Use up to four times daily as directed. (FOR ICD-9 250.00, 250.01)., Print    Insulin Glargine (LANTUS) 100 UNIT/ML Solostar Pen Inject 30 Units into the skin daily. Starting 06/05/14 at [place]., Starting 06/04/2014, Until Discontinued, Print  CONTINUE these medications which have NOT CHANGED   Details  cyclobenzaprine (FLEXERIL) 5 MG tablet Take 5 mg by mouth 3 (three) times daily as needed for muscle spasms., Until Discontinued, Historical Med    HYDROcodone-acetaminophen (NORCO/VICODIN) 5-325 MG per tablet Take 1 tablet by mouth every 6 (six) hours as needed for moderate pain., Until Discontinued, Historical Med    lisinopril-hydrochlorothiazide (PRINZIDE,ZESTORETIC) 20-12.5 MG per tablet Take 2 tablets by mouth daily., Until Discontinued, Historical Med    sertraline (ZOLOFT) 100 MG tablet Take 200 mg by mouth daily., Until Discontinued, Historical Med      STOP taking these medications     PRESCRIPTION MEDICATION      metFORMIN (GLUCOPHAGE) 850 MG tablet        Follow-up Information    Follow up with Shirline Frees, MD. Schedule an appointment as soon as possible for a visit in 2 days.   Specialty:  Family Medicine   Why:  post hospitalization follow up   Contact information:   [place] [address] [phone]       TOTAL DISCHARGE TIME: 76 mins  [facility] Pager [phone]  06/04/2014,  6:11 PM

## 2014-06-04 NOTE — Progress Notes (Signed)
Results for ABREY, BRADWAY (MRN [id number]) as of 06/04/2014 11:24  Ref. Range 06/03/2014 11:56 06/03/2014 16:59 06/03/2014 21:43 06/04/2014 05:45 06/04/2014 08:08  Glucose-Capillary Latest Range: 70-99 mg/dL 211 (H) 261 (H) 167 (H)  123 (H)    Results for TRAVIA, ONSTAD (MRN [id number]) as of 06/04/2014 11:24  Ref. Range 06/02/2014 16:37  Hgb A1c MFr Bld Latest Range: 4.8-5.6 % 15.1 (H)   Met with patient to provide follow-up education prior to discharge.  Pt states she feels comfortable checking blood sugars and administering insulin via pen, as she was doing both prior to this admission.  Reviewed symptoms and treatments for hypoglycemia.  Discussed elevated HgbA1c result and its meaning.  Reviewed priority information from Living Well with Diabetes book on logging CBG values and 15g carb choices.  Encouraged pt to check CBGs at different times of day to provide adequate information for determination of effectiveness of diabetes management after discharge.   Family not present at this time but encouraged pt to allow willing sister to help.  Reminded pt to attend outpatient diabetes education classes and she said she has already been contacted to schedule.  Also , reminded pt of importance of follow up with Dr. Buddy Duty.  Plan to go home on Lantus once daily.  CBG monitoring supplies, and outpatient education ordered.    Barnabas Lister, RN, MSN Education Student [employer]   Wyn Quaker RN, MSN, CDE Diabetes Coordinator Inpatient Diabetes Program Team Pager: [phone] (8a-10p)

## 2014-06-04 NOTE — Discharge Instructions (Signed)

## 2014-06-12 DIAGNOSIS — R739 Hyperglycemia, unspecified: Secondary | ICD-10-CM | POA: Insufficient documentation

## 2014-06-17 ENCOUNTER — Ambulatory Visit: Payer: BC Managed Care – PPO

## 2014-06-24 ENCOUNTER — Ambulatory Visit: Payer: BC Managed Care – PPO

## 2014-07-01 ENCOUNTER — Ambulatory Visit: Payer: BC Managed Care – PPO

## 2014-07-30 ENCOUNTER — Encounter (HOSPITAL_COMMUNITY): Payer: Self-pay | Admitting: *Deleted

## 2014-07-30 ENCOUNTER — Emergency Department (HOSPITAL_COMMUNITY)
Admission: EM | Admit: 2014-07-30 | Discharge: 2014-07-30 | Disposition: A | Discharge status: Home / Self Care | Payer: BC Managed Care – PPO | Attending: Emergency Medicine | Admitting: Emergency Medicine

## 2014-07-30 ENCOUNTER — Emergency Department (HOSPITAL_COMMUNITY): Payer: BC Managed Care – PPO

## 2014-07-30 ENCOUNTER — Encounter: Payer: Self-pay | Admitting: Internal Medicine

## 2014-07-30 DIAGNOSIS — Z9049 Acquired absence of other specified parts of digestive tract: Secondary | ICD-10-CM | POA: Diagnosis not present

## 2014-07-30 DIAGNOSIS — K858 Other acute pancreatitis without necrosis or infection: Secondary | ICD-10-CM

## 2014-07-30 DIAGNOSIS — R079 Chest pain, unspecified: Secondary | ICD-10-CM | POA: Insufficient documentation

## 2014-07-30 DIAGNOSIS — I10 Essential (primary) hypertension: Secondary | ICD-10-CM | POA: Diagnosis not present

## 2014-07-30 DIAGNOSIS — R1013 Epigastric pain: Secondary | ICD-10-CM | POA: Diagnosis present

## 2014-07-30 DIAGNOSIS — F419 Anxiety disorder, unspecified: Secondary | ICD-10-CM | POA: Diagnosis not present

## 2014-07-30 DIAGNOSIS — Z794 Long term (current) use of insulin: Secondary | ICD-10-CM | POA: Diagnosis not present

## 2014-07-30 DIAGNOSIS — E119 Type 2 diabetes mellitus without complications: Secondary | ICD-10-CM | POA: Insufficient documentation

## 2014-07-30 DIAGNOSIS — Z79899 Other long term (current) drug therapy: Secondary | ICD-10-CM | POA: Diagnosis not present

## 2014-07-30 LAB — HEPATIC FUNCTION PANEL
ALK PHOS: 90 U/L (ref 39–117)
ALT: 15 U/L (ref 0–35)
AST: 19 U/L (ref 0–37)
Albumin: 3.8 g/dL (ref 3.5–5.2)
Total Bilirubin: 0.4 mg/dL (ref 0.3–1.2)
Total Protein: 7.1 g/dL (ref 6.0–8.3)

## 2014-07-30 LAB — CBC
HEMATOCRIT: 37.1 % (ref 36.0–46.0)
HEMOGLOBIN: 12.9 g/dL (ref 12.0–15.0)
MCH: 29.3 pg (ref 26.0–34.0)
MCHC: 34.8 g/dL (ref 30.0–36.0)
MCV: 84.1 fL (ref 78.0–100.0)
Platelets: 354 10*3/uL (ref 150–400)
RBC: 4.41 MIL/uL (ref 3.87–5.11)
RDW: 12.1 % (ref 11.5–15.5)
WBC: 6.8 10*3/uL (ref 4.0–10.5)

## 2014-07-30 LAB — BASIC METABOLIC PANEL
ANION GAP: 8 (ref 5–15)
BUN: 16 mg/dL (ref 6–23)
CALCIUM: 9.3 mg/dL (ref 8.4–10.5)
CO2: 26 mmol/L (ref 19–32)
Chloride: 102 mmol/L (ref 96–112)
Creatinine, Ser: 0.74 mg/dL (ref 0.50–1.10)
Glucose, Bld: 418 mg/dL — ABNORMAL HIGH (ref 70–99)
Potassium: 4.2 mmol/L (ref 3.5–5.1)
SODIUM: 136 mmol/L (ref 135–145)

## 2014-07-30 LAB — BRAIN NATRIURETIC PEPTIDE: B Natriuretic Peptide: 14.8 pg/mL (ref 0.0–100.0)

## 2014-07-30 LAB — I-STAT TROPONIN, ED
TROPONIN I, POC: 0 ng/mL (ref 0.00–0.08)
Troponin i, poc: 0 ng/mL (ref 0.00–0.08)

## 2014-07-30 LAB — LIPASE, BLOOD: LIPASE: 123 U/L — AB (ref 11–59)

## 2014-07-30 LAB — CBG MONITORING, ED: GLUCOSE-CAPILLARY: 306 mg/dL — AB (ref 70–99)

## 2014-07-30 MED ORDER — SODIUM CHLORIDE 0.9 % IV BOLUS (SEPSIS)
1000.0000 mL | Freq: Once | INTRAVENOUS | Status: AC
  Administered 2014-07-30: 1000 mL via INTRAVENOUS

## 2014-07-30 MED ORDER — INSULIN GLARGINE 100 UNIT/ML ~~LOC~~ SOLN
48.0000 [IU] | Freq: Once | SUBCUTANEOUS | Status: AC
  Administered 2014-07-30: 48 [IU] via SUBCUTANEOUS
  Filled 2014-07-30 (×2): qty 0.48

## 2014-07-30 MED ORDER — HYDROCODONE-ACETAMINOPHEN 5-325 MG PO TABS
1.0000 | ORAL_TABLET | Freq: Once | ORAL | Status: AC
  Administered 2014-07-30: 1 via ORAL
  Filled 2014-07-30: qty 1

## 2014-07-30 MED ORDER — HYDROCODONE-ACETAMINOPHEN 5-325 MG PO TABS: 1.0000 | ORAL_TABLET | Freq: Four times a day (QID) | ORAL | Status: DC | PRN

## 2014-07-30 NOTE — ED Notes (Signed)
Pt reports mid chest tightness since Sunday. Having diaphoresis, sob and nausea. Also recent non productive cough and bilateral ear pain. ekg done at triage and no acute distress is noted.

## 2014-07-30 NOTE — Discharge Instructions (Signed)
Liquids and soft foods for 1-2 days.  Follow up with your md in 2 days for recheck.  Return if problems

## 2014-07-30 NOTE — ED Notes (Signed)
Pt states feeling much better. Rates pain 1/10

## 2014-07-30 NOTE — ED Notes (Signed)
Pt c/o return of cp. Dr Roderic Palau informed, Vicodin given as ordered. Continue to monitor

## 2014-07-30 NOTE — ED Provider Notes (Signed)
CSN: [id number]     Arrival date & time 07/30/14  1629 History   First MD Initiated Contact with Patient 07/30/14 1709     Chief Complaint  Patient presents with  . Chest Pain     (Consider location/radiation/quality/duration/timing/severity/associated sxs/prior Treatment) Patient is a 59 y.o. female presenting with chest pain. The history is provided by the patient (the pt states she has been having some chest pain).  Chest Pain Pain location:  Epigastric Pain quality: aching   Pain radiates to:  Does not radiate Pain radiates to the back: no   Pain severity:  Mild Onset quality:  Sudden Timing:  Intermittent Chronicity:  New Associated symptoms: no abdominal pain, no back pain, no cough, no fatigue and no headache     Past Medical History  Diagnosis Date  . Hypertension   . Diabetes mellitus without complication   . Pancreatitis   . Anxiety    Past Surgical History  Procedure Laterality Date  . Cholecystectomy     History reviewed. No pertinent family history. History  Substance Use Topics  . Smoking status: Never Smoker   . Smokeless tobacco: Not on file  . Alcohol Use: No   OB History    No data available     Review of Systems  Constitutional: Negative for appetite change and fatigue.  HENT: Negative for congestion, ear discharge and sinus pressure.   Eyes: Negative for discharge.  Respiratory: Negative for cough.   Cardiovascular: Positive for chest pain.  Gastrointestinal: Negative for abdominal pain and diarrhea.  Genitourinary: Negative for frequency and hematuria.  Musculoskeletal: Negative for back pain.  Skin: Negative for rash.  Neurological: Negative for seizures and headaches.  Psychiatric/Behavioral: Negative for hallucinations.      Allergies  Shrimp  Home Medications   Prior to Admission medications   Medication Sig Start Date End Date Taking? Authorizing Provider  cyclobenzaprine (FLEXERIL) 5 MG tablet Take 5 mg by mouth 3 (three)  times daily as needed for muscle spasms.   Yes Historical Provider, MD  Insulin Glargine (LANTUS) 100 UNIT/ML Solostar Pen Inject 30 Units into the skin daily. Starting 06/05/14 at [place]. Patient taking differently: Inject 48 Units into the skin daily. Starting 06/05/14 at [place]. 06/04/14  Yes Bonnielee Haff, MD  lisinopril-hydrochlorothiazide (PRINZIDE,ZESTORETIC) 20-12.5 MG per tablet Take 2 tablets by mouth daily.   Yes Historical Provider, MD  sertraline (ZOLOFT) 100 MG tablet Take 200 mg by mouth daily.   Yes Historical Provider, MD  blood glucose meter kit and supplies KIT Dispense based on patient and insurance preference. Use up to four times daily as directed. (FOR ICD-9 250.00, 250.01). 06/04/14   Bonnielee Haff, MD  HYDROcodone-acetaminophen (NORCO/VICODIN) 5-325 MG per tablet Take 1 tablet by mouth every 6 (six) hours as needed for moderate pain. 07/30/14   Milton Ferguson, MD   BP 165/73 mmHg  Pulse 108  Resp 21  SpO2 99% Physical Exam  Constitutional: She is oriented to person, place, and time. She appears well-developed.  HENT:  Head: Normocephalic.  Eyes: Conjunctivae and EOM are normal. No scleral icterus.  Neck: Neck supple. No thyromegaly present.  Cardiovascular: Normal rate and regular rhythm.  Exam reveals no gallop and no friction rub.   No murmur heard. Pulmonary/Chest: No stridor. She has no wheezes. She has no rales. She exhibits no tenderness.  Abdominal: She exhibits no distension. There is no tenderness. There is no rebound.  Musculoskeletal: Normal range of motion. She exhibits no edema.  Lymphadenopathy:  She has no cervical adenopathy.  Neurological: She is oriented to person, place, and time. She exhibits normal muscle tone. Coordination normal.  Skin: No rash noted. No erythema.  Psychiatric: She has a normal mood and affect. Her behavior is normal.    ED Course  Procedures (including critical care time) Labs Review Labs Reviewed  BASIC METABOLIC PANEL -  Abnormal; Notable for the following:    Glucose, Bld 418 (*)    All other components within normal limits  LIPASE, BLOOD - Abnormal; Notable for the following:    Lipase 123 (*)    All other components within normal limits  CBG MONITORING, ED - Abnormal; Notable for the following:    Glucose-Capillary 306 (*)    All other components within normal limits  CBC  BRAIN NATRIURETIC PEPTIDE  HEPATIC FUNCTION PANEL  I-STAT TROPOININ, ED  Randolm Idol, ED    Imaging Review Dg Chest 2 View  07/30/2014   CLINICAL DATA:  Right-sided chest tightness for 3 days  EXAM: CHEST  2 VIEW  COMPARISON:  None.  FINDINGS: The heart size and mediastinal contours are within normal limits. Both lungs are clear. The visualized skeletal structures are unremarkable.  IMPRESSION: No active cardiopulmonary disease.   Electronically Signed   By: Kerby Moors M.D.   On: 07/30/2014 17:11     EKG Interpretation   Date/Time:  Wednesday July 30 2014 16:35:06 EDT Ventricular Rate:  103 PR Interval:  152 QRS Duration: 74 QT Interval:  356 QTC Calculation: 466 R Axis:   43 Text Interpretation:  Sinus tachycardia Possible Left atrial enlargement  Cannot rule out Anterior infarct , age undetermined Abnormal ECG Confirmed  by Abdulhamid Olgin  MD, Adir Schicker [phone]) on 07/30/2014 5:17:51 PM      MDM   Final diagnoses:  Other acute pancreatitis    Pain 2nd to pancreatitis,  Pt taking po fine.  Will tx with hydrocodone and liquid diet and  Follow up with pcp in 2 days   Milton Ferguson, MD 07/30/14 2306

## 2014-08-25 ENCOUNTER — Encounter: Payer: Self-pay | Admitting: Internal Medicine

## 2014-11-27 ENCOUNTER — Other Ambulatory Visit: Payer: Self-pay | Admitting: Obstetrics and Gynecology

## 2014-12-01 LAB — CYTOLOGY - PAP

## 2016-12-23 ENCOUNTER — Encounter (HOSPITAL_COMMUNITY): Payer: Self-pay | Admitting: *Deleted

## 2016-12-23 ENCOUNTER — Emergency Department (HOSPITAL_COMMUNITY): Payer: BC Managed Care – PPO

## 2016-12-23 ENCOUNTER — Emergency Department (HOSPITAL_COMMUNITY)
Admission: EM | Admit: 2016-12-23 | Discharge: 2016-12-24 | Disposition: A | Discharge status: Home / Self Care | Payer: BC Managed Care – PPO | Attending: Emergency Medicine | Admitting: Emergency Medicine

## 2016-12-23 DIAGNOSIS — I160 Hypertensive urgency: Secondary | ICD-10-CM | POA: Diagnosis not present

## 2016-12-23 DIAGNOSIS — I10 Essential (primary) hypertension: Secondary | ICD-10-CM | POA: Insufficient documentation

## 2016-12-23 DIAGNOSIS — R109 Unspecified abdominal pain: Secondary | ICD-10-CM | POA: Diagnosis not present

## 2016-12-23 DIAGNOSIS — R1010 Upper abdominal pain, unspecified: Secondary | ICD-10-CM | POA: Insufficient documentation

## 2016-12-23 DIAGNOSIS — Z794 Long term (current) use of insulin: Secondary | ICD-10-CM | POA: Insufficient documentation

## 2016-12-23 DIAGNOSIS — N390 Urinary tract infection, site not specified: Secondary | ICD-10-CM

## 2016-12-23 DIAGNOSIS — Z79899 Other long term (current) drug therapy: Secondary | ICD-10-CM | POA: Insufficient documentation

## 2016-12-23 DIAGNOSIS — E119 Type 2 diabetes mellitus without complications: Secondary | ICD-10-CM | POA: Diagnosis not present

## 2016-12-23 LAB — CBC
HCT: 40.5 % (ref 36.0–46.0)
Hemoglobin: 13.8 g/dL (ref 12.0–15.0)
MCH: 29.1 pg (ref 26.0–34.0)
MCHC: 34.1 g/dL (ref 30.0–36.0)
MCV: 85.4 fL (ref 78.0–100.0)
PLATELETS: 384 10*3/uL (ref 150–400)
RBC: 4.74 MIL/uL (ref 3.87–5.11)
RDW: 12.2 % (ref 11.5–15.5)
WBC: 7.3 10*3/uL (ref 4.0–10.5)

## 2016-12-23 LAB — COMPREHENSIVE METABOLIC PANEL
ALT: 22 U/L (ref 14–54)
AST: 18 U/L (ref 15–41)
Albumin: 4.3 g/dL (ref 3.5–5.0)
Alkaline Phosphatase: 75 U/L (ref 38–126)
Anion gap: 10 (ref 5–15)
BUN: 10 mg/dL (ref 6–20)
CALCIUM: 9.6 mg/dL (ref 8.9–10.3)
CO2: 28 mmol/L (ref 22–32)
Chloride: 95 mmol/L — ABNORMAL LOW (ref 101–111)
Creatinine, Ser: 0.8 mg/dL (ref 0.44–1.00)
GFR calc Af Amer: >60 mL/min (ref >60)
GFR calc non Af Amer: >60 mL/min (ref >60)
Glucose, Bld: 368 mg/dL — ABNORMAL HIGH (ref 65–99)
Potassium: 4 mmol/L (ref 3.5–5.1)
SODIUM: 133 mmol/L — AB (ref 135–145)
Total Bilirubin: 0.9 mg/dL (ref 0.3–1.2)
Total Protein: 7.5 g/dL (ref 6.5–8.1)

## 2016-12-23 LAB — URINALYSIS, ROUTINE W REFLEX MICROSCOPIC
Bacteria, UA: NONE SEEN
Bilirubin Urine: NEGATIVE
Glucose, UA: >=500 mg/dL — AB
Hgb urine dipstick: NEGATIVE
KETONES UR: NEGATIVE mg/dL
Leukocytes, UA: NEGATIVE
Nitrite: POSITIVE — AB
Protein, ur: NEGATIVE mg/dL
SQUAMOUS EPITHELIAL / LPF: NONE SEEN
Specific Gravity, Urine: 1.022 (ref 1.005–1.030)
pH: 6 (ref 5.0–8.0)

## 2016-12-23 LAB — CBG MONITORING, ED: Glucose-Capillary: 314 mg/dL — ABNORMAL HIGH (ref 65–99)

## 2016-12-23 LAB — LIPASE, BLOOD: Lipase: 44 U/L (ref 11–51)

## 2016-12-23 LAB — I-STAT TROPONIN, ED: Troponin i, poc: 0.01 ng/mL (ref 0.00–0.08)

## 2016-12-23 MED ORDER — MORPHINE SULFATE (PF) 4 MG/ML IV SOLN
2.0000 mg | Freq: Once | INTRAVENOUS | Status: AC
  Administered 2016-12-23: 2 mg via INTRAVENOUS
  Filled 2016-12-23: qty 1

## 2016-12-23 MED ORDER — CEPHALEXIN 250 MG PO CAPS: 250.0000 mg | ORAL_CAPSULE | Freq: Four times a day (QID) | ORAL | 0 refills | Status: DC

## 2016-12-23 MED ORDER — AMLODIPINE BESYLATE 5 MG PO TABS: 5.0000 mg | ORAL_TABLET | Freq: Every day | ORAL | 0 refills | Status: DC

## 2016-12-23 MED ORDER — IOPAMIDOL (ISOVUE-370) INJECTION 76%
INTRAVENOUS | Status: AC
  Administered 2016-12-23: 100 mL
  Filled 2016-12-23: qty 100

## 2016-12-23 MED ORDER — CEPHALEXIN 250 MG PO CAPS
500.0000 mg | ORAL_CAPSULE | Freq: Once | ORAL | Status: AC
  Administered 2016-12-23: 500 mg via ORAL
  Filled 2016-12-23: qty 2

## 2016-12-23 MED ORDER — AMLODIPINE BESYLATE 5 MG PO TABS
5.0000 mg | ORAL_TABLET | Freq: Once | ORAL | Status: AC
  Administered 2016-12-23: 5 mg via ORAL
  Filled 2016-12-23: qty 1

## 2016-12-23 MED ORDER — LORAZEPAM 2 MG/ML IJ SOLN
0.5000 mg | Freq: Once | INTRAMUSCULAR | Status: AC
  Administered 2016-12-23: 0.5 mg via INTRAVENOUS
  Filled 2016-12-23: qty 1

## 2016-12-23 MED ORDER — CLEVIDIPINE BUTYRATE 0.5 MG/ML IV EMUL
1.0000 mg/h | INTRAVENOUS | Status: DC
  Administered 2016-12-23: 1 mg/h via INTRAVENOUS
  Filled 2016-12-23: qty 50

## 2016-12-23 MED ORDER — NITROGLYCERIN IN D5W 200-5 MCG/ML-% IV SOLN
0.0000 ug/min | Freq: Once | INTRAVENOUS | Status: AC
  Administered 2016-12-23: 5 ug/min via INTRAVENOUS
  Filled 2016-12-23: qty 250

## 2016-12-23 NOTE — Discharge Instructions (Signed)
Take medications as prescribed. Return here if you're worse at any time. Follow up with your primary care doctor as soon as possible.

## 2016-12-23 NOTE — ED Provider Notes (Addendum)
[facility] DEPT Provider Note   CSN: [id number] Arrival date & time: 12/23/16  1744     History   Chief Complaint Chief Complaint  Patient presents with  . Abdominal Pain    HPI Kimberly Hunt is a 61 y.o. female.  HPI  61 year old female history of diabetes, hypertension who presents today with intermittent upper abdominal pain for the past 3 days which has worsened today. She describes it as aching like a toothache. She cannot tell me anything that seems to make it better or worse. She has not had associated nausea, vomiting, diarrhea, fever, chills, or urinary tract infection symptoms. She has a history of high blood pressure and was noted triage to be hypertensive with systolic blood pressure of 240. She reports taking her blood pressure medicines as prescribed and not missing any doses.  Past Medical History:  Diagnosis Date  . Anxiety   . Diabetes mellitus without complication ([place])   . Hypertension   . Pancreatitis     Patient Active Problem List   Diagnosis Date Noted  . Hyperglycemia   . Type 2 diabetes mellitus with hyperglycemia ([place])   . Hyperosmolar non-ketotic state in patient with type 2 diabetes mellitus ([place]) 06/03/2014  . Pancreatitis 06/02/2014  . Abdominal pain, generalized 06/02/2014    Past Surgical History:  Procedure Laterality Date  . CHOLECYSTECTOMY      OB History    No data available       Home Medications    Prior to Admission medications   Medication Sig Start Date End Date Taking? Authorizing Provider  ALPRAZolam (XANAX) 0.25 MG tablet Take 0.25 mg by mouth daily as needed. 12/13/16  Yes [provider]  citalopram (CELEXA) 20 MG tablet Take 20 mg by mouth at bedtime. 11/24/16  Yes [provider]  HYDROcodone-acetaminophen (NORCO) 7.5-325 MG tablet Take 1 tablet by mouth 2 (two) times daily as needed. 12/14/16  Yes [provider]  HYDROcodone-acetaminophen (NORCO/VICODIN) 5-325 MG per tablet Take 1  tablet by mouth every 6 (six) hours as needed for moderate pain. 07/30/14  Yes Milton Ferguson, MD  irbesartan-hydrochlorothiazide (AVALIDE) 150-12.5 MG tablet Take 1 tablet by mouth daily. 10/14/16  Yes [provider]  LEVEMIR FLEXTOUCH 100 UNIT/ML Pen Inject 30 Units into the skin daily. 12/14/16  Yes [provider]  lisinopril-hydrochlorothiazide (PRINZIDE,ZESTORETIC) 20-12.5 MG per tablet Take 1 tablet by mouth daily.    Yes [provider]  zolpidem (AMBIEN) 10 MG tablet Take 10 mg by mouth at bedtime as needed. 10/31/16  Yes [provider]  blood glucose meter kit and supplies KIT Dispense based on patient and insurance preference. Use up to four times daily as directed. (FOR ICD-9 250.00, 250.01). 06/04/14   Bonnielee Haff, MD  cyclobenzaprine (FLEXERIL) 5 MG tablet Take 5 mg by mouth 3 (three) times daily as needed for muscle spasms.    [provider]  Insulin Glargine (LANTUS) 100 UNIT/ML Solostar Pen Inject 30 Units into the skin daily. Starting 06/05/14 at [place]. Patient taking differently: Inject 48 Units into the skin daily. Starting 06/05/14 at [place]. 06/04/14   Bonnielee Haff, MD  sertraline (ZOLOFT) 100 MG tablet Take 200 mg by mouth daily.    [provider]    Family History No family history on file.  Social History Social History  Substance Use Topics  . Smoking status: Never Smoker  . Smokeless tobacco: Never Used  . Alcohol use No     Allergies   Shrimp [  shellfish allergy]   Review of Systems Review of Systems  All other systems reviewed and are negative.    Physical Exam Updated Vital Signs BP (!) 238/90   Pulse 73   Temp 98.3 F (36.8 C) (Oral)   Resp 14   SpO2 100%   Physical Exam  Constitutional: She is oriented to person, place, and time. She appears well-developed and well-nourished. No distress.  HENT:  Head: Normocephalic and atraumatic.  Right Ear: External ear normal.  Left Ear: External ear  normal.  Mouth/Throat: Oropharynx is clear and moist.  Eyes: Pupils are equal, round, and reactive to light. EOM are normal.  Neck: Normal range of motion. Neck supple.  Cardiovascular: Normal rate, regular rhythm and normal heart sounds.   Pulmonary/Chest: Effort normal and breath sounds normal.  Abdominal: Soft. There is tenderness.  Mild tenderness to palpation mild tenderness palpation in the epigastrium and the right upper quadrant.  Musculoskeletal: Normal range of motion.  Neurological: She is alert and oriented to person, place, and time.  Skin: Skin is warm and dry. Capillary refill takes less than 2 seconds.  Psychiatric: She has a normal mood and affect.  Nursing note and vitals reviewed.    ED Treatments / Results  Labs (all labs ordered are listed, but only abnormal results are displayed) Labs Reviewed  COMPREHENSIVE METABOLIC PANEL - Abnormal; Notable for the following:       Result Value   Sodium 133 (*)    Chloride 95 (*)    Glucose, Bld 368 (*)    All other components within normal limits  URINALYSIS, ROUTINE W REFLEX MICROSCOPIC - Abnormal; Notable for the following:    Color, Urine AMBER (*)    Glucose, UA >=500 (*)    Nitrite POSITIVE (*)    All other components within normal limits  LIPASE, BLOOD  CBC    EKG  EKG Interpretation  Date/Time:  Friday December 23 2016 21:50:51 EDT Ventricular Rate:  92 PR Interval:    QRS Duration: 87 QT Interval:  378 QTC Calculation: 468 R Axis:   36 Text Interpretation:  Sinus rhythm Confirmed by Pattricia Boss [phone]) on 12/23/2016 9:55:00 PM       Radiology Ct Angio Abd/pel W And/or Wo Contrast  Result Date: 12/23/2016 CLINICAL DATA:  61 year old female with acute abdominal and pelvic pain and fullness. GI bleed. EXAM: CTA ABDOMEN AND PELVIS WITHOUT AND WITH CONTRAST TECHNIQUE: Multidetector CT imaging of the abdomen and pelvis was performed using the standard protocol during bolus administration of intravenous  contrast. Multiplanar reconstructed images and MIPs were obtained and reviewed to evaluate the vascular anatomy. CONTRAST:  100 cc intravenous Isovue 370 COMPARISON:  06/02/2014 CT FINDINGS: VASCULAR Aorta: Normal Celiac: Unremarkable SMA: Unremarkable Renals: Unremarkable IMA: Unremarkable Inflow: Unremarkable Proximal Outflow: Unremarkable Veins: Unremarkable Review of the MIP images confirms the above findings. NON-VASCULAR Lower chest: No acute abnormality Hepatobiliary: Hepatic steatosis noted without focal hepatic abnormality. The patient is status post cholecystectomy. No evidence of biliary dilatation. Pancreas: Unremarkable Spleen: Unremarkable Adrenals/Urinary Tract: The kidneys, adrenal glands and bladder are unremarkable. Stomach/Bowel: Stomach is within normal limits. Appendix appears normal. No evidence of bowel wall thickening, distention, or inflammatory changes. Lymphatic: No enlarged lymph nodes. Reproductive: Uterus and bilateral adnexa are unremarkable. Other: No ascites, focal collection or pneumoperitoneum. Musculoskeletal: No acute or significant osseous findings. IMPRESSION: VASCULAR No abnormalities identified. No evidence of aortic aneurysm or dissection. NON-VASCULAR Hepatic steatosis without other significant abnormality. Electronically Signed   By: Dellis Filbert  Hu M.D.   On: 12/23/2016 21:13    Procedures Procedures (including critical care time)  Medications Ordered in ED Medications - No data to display   Initial Impression / Assessment and Plan / ED Course  I have reviewed the triage vital signs and the nursing notes.  Pertinent labs & imaging results that were available during my care of the patient were reviewed by me and considered in my medical decision making (see chart for details).     1- hypertensive urgency with sbp 250 on arrival- patient with abdominal pain but no chest pain, dyspnea or cns symptoms- cleviprex started and bp now at 173-Discussed with Dr.  Loleta Books and will stop, products and attempt nitro drip to accommodate for stepdown versus ICU placement 2- Abdominal pain patient with epigastric discomfort. In the setting of severe hypertension, CT angios ordered to assess for AAA. No acute intra-abdominal abnormality noted except for hepatic steatosis plan ultrasound as inpatient or outpatient to assess.  CRITICAL CARE Performed by: Shaune Pollack Total critical care time: 60 minutes Critical care time was exclusive of separately billable procedures and treating other patients. Critical care was necessary to treat or prevent imminent or life-threatening deterioration. Critical care was time spent personally by me on the following activities: development of treatment plan with patient and/or surrogate as well as nursing, discussions with consultants, evaluation of patient's response to treatment, examination of patient, obtaining history from patient or surrogate, ordering and performing treatments and interventions, ordering and review of laboratory studies, ordering and review of radiographic studies, pulse oximetry and re-evaluation of patient's condition.  Discussed with Dr. Loleta Books. Patient with blood pressures decreased to 160. She has maintained a systolic blood pressure of 160 after nitroglycerin drip has been stopped. She has been given additional amlodipine here. He will start Keflex. She appears stable for discharge. She prefers to go home. She understands return precautions and need for follow-up. Final Clinical Impressions(s) / ED Diagnoses   Final diagnoses:  Hypertensive urgency  Abdominal pain, unspecified abdominal location    New Prescriptions New Prescriptions   No medications on file     Pattricia Boss, MD 12/23/16 2255    Pattricia Boss, MD 12/23/16 9201    Pattricia Boss, MD 12/23/16 [phone]

## 2016-12-23 NOTE — Consult Note (Signed)
Hospitalist Service Medical Consultation   Kimberly Hunt  WCB:[id number]  DOB: [birth year]  DOA: 12/23/2016  PCP: Shirline Frees, MD      Requesting physician: Pattricia Boss, MD  Reason for consultation: Hypertension, abdominal pain pain   History of Present Illness: Kimberly Hunt is an 61 y.o. female with past medical history significant for HTN and DM who presents with few days suprapubic pain.  The patient was in her usual state of health until this week, early in the week she started to have suprapubic pain and urinary discomfort/urgency.  She took some Azo, but today, the pain progressed to be periumbilical or epigastric and in the left flank. It was bad enough she had to leave school and when she got home, it didn't get better and she started to hyperventilate, feel anxious and so finally she came to the ER.  ED course: -Afebrile, heart rate 86, respirations and pulse ox normal, blood pressure 244/104 mmHg -Na 133, K 4.0, Cr 0.8, WBC 7.3K, Hgb 13.8 -Glucose 300 -Troponin normal -ECG showed sinus tachycardia, no ST changes -Lipase normal -UA showed nitrites -CT angiogram of the abdomen and pelvis showed no focal findings -She was started on clevidipine gtt and TRH were asked to evaluate for hypertensive urgency      Review of Systems:  As per HPI otherwise 10 systems were reviewed and were negative.   Past Medical History: Past Medical History:  Diagnosis Date  . Anxiety   . Diabetes mellitus without complication ([place])   . Hypertension   . Pancreatitis     Past Surgical History: Past Surgical History:  Procedure Laterality Date  . CHOLECYSTECTOMY       Allergies:   Allergies  Allergen Reactions  . Shrimp [Shellfish Allergy]     Hives      Social History:  reports that she has never smoked. She has never used smokeless tobacco. She reports that she does not drink alcohol or use drugs.   Family History: No family history on  file.   Physical Exam: Vitals:   12/23/16 2234 12/23/16 2250 12/23/16 2300 12/23/16 2318  BP: (!) 159/85 (!) 168/82 (!) 163/80 (!) 161/76  Pulse: 97 92 90   Resp: 13 (!) 24 19   Temp:      TempSrc:      SpO2: 96% 98% 99%     Constitutional: Alert and awake, oriented x3, not in any acute distress. Eyes: PERLA, EOMI, irises appear normal, anicteric sclera,  ENMT: external ears and nose appear normal, hearing normal            Lips appears normal, oropharynx mucosa, tongue, posterior pharynx appear normal  Neck: neck appears normal, no masses, normal ROM, no thyromegaly, no JVD  CVS: S1-S2 clear, no murmur rubs or gallops, no LE edema, normal pedal pulses  Respiratory:  clear to auscultation bilaterally, no wheezing, rales or rhonchi. Respiratory effort normal. No accessory muscle use.  GI: soft nontender, nondistended, normal bowel sounds, no hepatosplenomegaly, no hernias  Musculoskeletal: no cyanosis, clubbing or edema noted bilaterally Neuro: Cranial nerves II-XII intact, strength, sensation, reflexes Psych: judgement and insight appear normal, stable mood and affect, mental status Skin: no rashes or lesions or ulcers, no induration or nodules    Data reviewed:  I have personally reviewed following labs and imaging studies Labs:  CBC:  Recent Labs Lab 12/23/16 1827  WBC 7.3  HGB  13.8  HCT 40.5  MCV 85.4  PLT 409    Basic Metabolic Panel:  Recent Labs Lab 12/23/16 1827  NA 133*  K 4.0  CL 95*  CO2 28  GLUCOSE 368*  BUN 10  CREATININE 0.80  CALCIUM 9.6   GFR CrCl cannot be calculated (Unknown ideal weight.). Liver Function Tests:  Recent Labs Lab 12/23/16 1827  AST 18  ALT 22  ALKPHOS 75  BILITOT 0.9  PROT 7.5  ALBUMIN 4.3    Recent Labs Lab 12/23/16 1827  LIPASE 44   No results for input(s): AMMONIA in the last 168 hours. Coagulation profile No results for input(s): INR, PROTIME in the last 168 hours.  Cardiac Enzymes: No results for  input(s): CKTOTAL, CKMB, CKMBINDEX, TROPONINI in the last 168 hours. BNP: Invalid input(s): POCBNP CBG:  Recent Labs Lab 12/23/16 2224  GLUCAP 314*   D-Dimer No results for input(s): DDIMER in the last 72 hours. Hgb A1c No results for input(s): HGBA1C in the last 72 hours. Lipid Profile No results for input(s): CHOL, HDL, LDLCALC, TRIG, CHOLHDL, LDLDIRECT in the last 72 hours. Thyroid function studies No results for input(s): TSH, T4TOTAL, T3FREE, THYROIDAB in the last 72 hours.  Invalid input(s): FREET3 Anemia work up No results for input(s): VITAMINB12, FOLATE, FERRITIN, TIBC, IRON, RETICCTPCT in the last 72 hours. Urinalysis    Component Value Date/Time   COLORURINE AMBER (A) 12/23/2016 1852   APPEARANCEUR CLEAR 12/23/2016 1852   LABSPEC 1.022 12/23/2016 1852   PHURINE 6.0 12/23/2016 1852   GLUCOSEU >=500 (A) 12/23/2016 1852   HGBUR NEGATIVE 12/23/2016 1852   BILIRUBINUR NEGATIVE 12/23/2016 1852   KETONESUR NEGATIVE 12/23/2016 1852   PROTEINUR NEGATIVE 12/23/2016 1852   UROBILINOGEN 0.2 06/02/2014 1651   NITRITE POSITIVE (A) 12/23/2016 1852   LEUKOCYTESUR NEGATIVE 12/23/2016 1852     Sepsis Labs Invalid input(s): PROCALCITONIN,  WBC,  LACTICIDVEN Microbiology No results found for this or any previous visit (from the past 240 hour(s)).     Radiological Exams on Admission: Ct Angio Abd/pel W And/or Wo Contrast  Result Date: 12/23/2016 CLINICAL DATA:  61 year old female with acute abdominal and pelvic pain and fullness. GI bleed. EXAM: CTA ABDOMEN AND PELVIS WITHOUT AND WITH CONTRAST TECHNIQUE: Multidetector CT imaging of the abdomen and pelvis was performed using the standard protocol during bolus administration of intravenous contrast. Multiplanar reconstructed images and MIPs were obtained and reviewed to evaluate the vascular anatomy. CONTRAST:  100 cc intravenous Isovue 370 COMPARISON:  06/02/2014 CT FINDINGS: VASCULAR Aorta: Normal Celiac: Unremarkable SMA:  Unremarkable Renals: Unremarkable IMA: Unremarkable Inflow: Unremarkable Proximal Outflow: Unremarkable Veins: Unremarkable Review of the MIP images confirms the above findings. NON-VASCULAR Lower chest: No acute abnormality Hepatobiliary: Hepatic steatosis noted without focal hepatic abnormality. The patient is status post cholecystectomy. No evidence of biliary dilatation. Pancreas: Unremarkable Spleen: Unremarkable Adrenals/Urinary Tract: The kidneys, adrenal glands and bladder are unremarkable. Stomach/Bowel: Stomach is within normal limits. Appendix appears normal. No evidence of bowel wall thickening, distention, or inflammatory changes. Lymphatic: No enlarged lymph nodes. Reproductive: Uterus and bilateral adnexa are unremarkable. Other: No ascites, focal collection or pneumoperitoneum. Musculoskeletal: No acute or significant osseous findings. IMPRESSION: VASCULAR No abnormalities identified. No evidence of aortic aneurysm or dissection. NON-VASCULAR Hepatic steatosis without other significant abnormality. Electronically Signed   By: Margarette Canada M.D.   On: 12/23/2016 21:13    Impression/Recommendations 1. Hypertensive urgency: Without evidence of end organ damage.  Has mostly resolved already, after stopping clevidipine.  She has been  adherent to her ARB/HCT this week.  States her baseline BP is usually ~160/100 mmHg. -Start amlodipine (Norvasc) 5 mg daily in the morning -Continue irbesartan-HCTZ -Follow up with PCP at previously scheduled appointment next Tuesday -Return precautions given   2. UTI: Suprapubic pain, urgency. -Start cephalexin/Keflex         Edwin Dada M.D. [facility] 12/23/2016, 11:28 PM

## 2016-12-23 NOTE — ED Triage Notes (Signed)
Pt c/o RLQ abd pain onset x-4 days, denies n/v/d, pt hypertensive in triage, pt c/o blurred vision at this time bilaterally, no slurred speech, pt A&O x4, hx of HTN, pt reports compliance with HTN meds

## 2016-12-23 NOTE — ED Notes (Signed)
Pt c/o anxiety and nausea. MD notified

## 2016-12-24 NOTE — ED Notes (Signed)
Pt verbalized understanding of d/c instructions and has no further questions. Pt is stable, A&Ox4, VSS.  

## 2016-12-25 LAB — URINE CULTURE: CULTURE: NO GROWTH

## 2019-01-23 IMAGING — CT CT CTA ABD/PEL W/CM AND/OR W/O CM
3 of 12 series · 13 of 46 positions shown, 18 images · IV contrast (isovue)
Comparison: 06/02/2014 CT

CLINICAL DATA: 60-year-old female with acute abdominal and pelvic
pain and fullness. GI bleed.

EXAM:
CTA ABDOMEN AND PELVIS WITHOUT AND WITH CONTRAST
TECHNIQUE: Multidetector CT imaging of the abdomen and pelvis was performed
using the standard protocol during bolus administration of
intravenous contrast. Multiplanar reconstructed images and MIPs were
obtained and reviewed to evaluate the vascular anatomy.
CONTRAST:  100 cc intravenous Isovue 370

[Series 8: cor · coronal · 0.74mm/px · 2 of 151 slices shown, 3 images]
[im 51/151  soft-tissue]
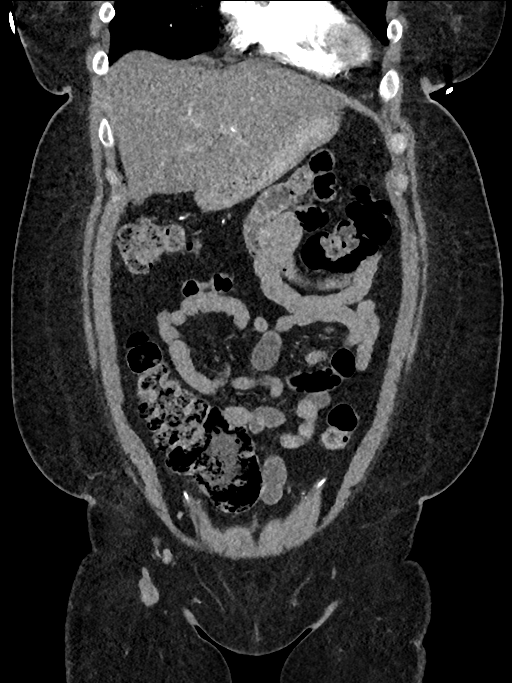
[im 51/151  bone]
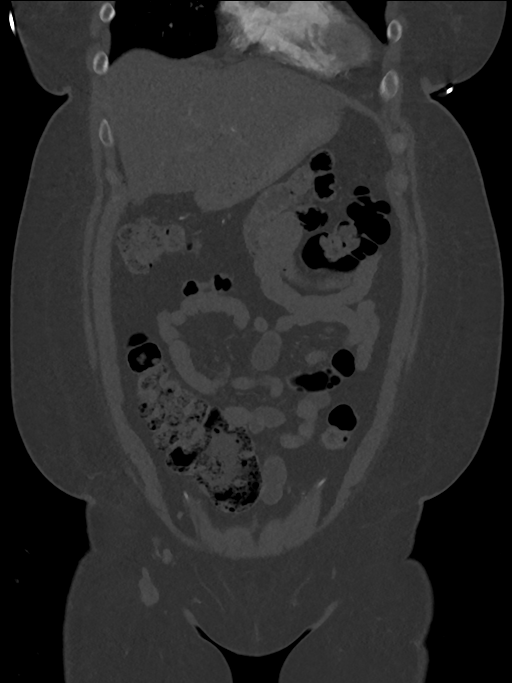
[im 101/151  soft-tissue]
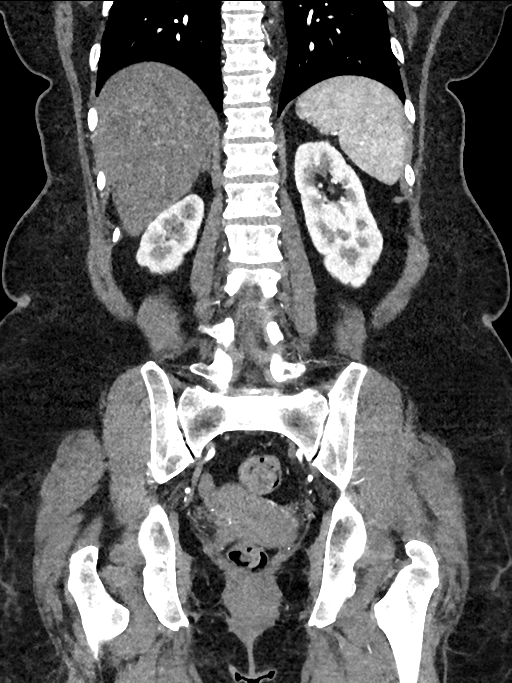

[Series 12: venous · axial · portal-venous · 0.86mm/px · z∈[+908,+1414]mm · 3 of 254 slices shown, 7 images]
[im 1/254  soft-tissue]
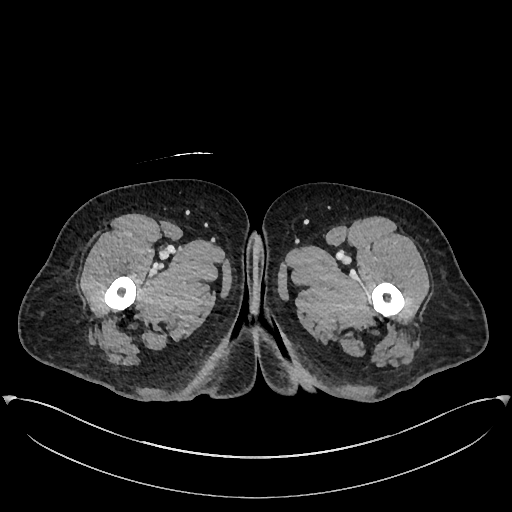
[im 1/254  lung]
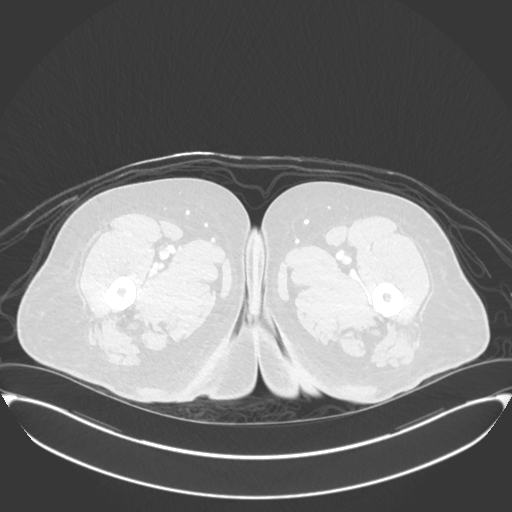
[im 1/254  bone]
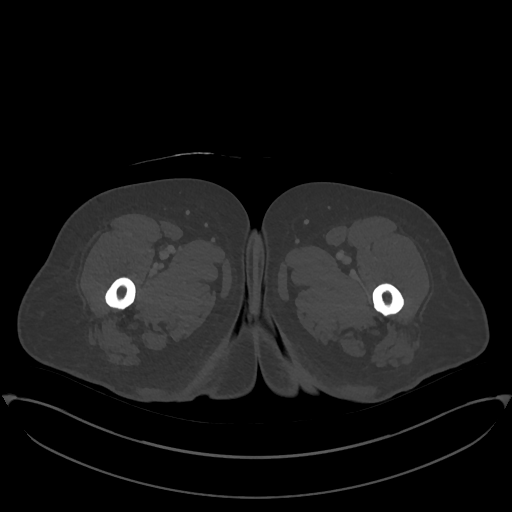
[im 127/254  soft-tissue]
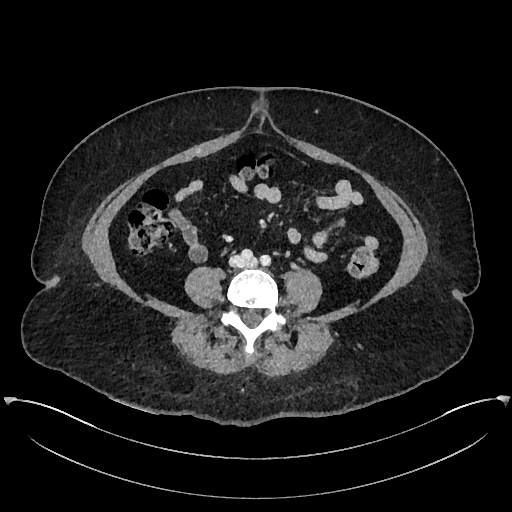
[im 127/254  lung]
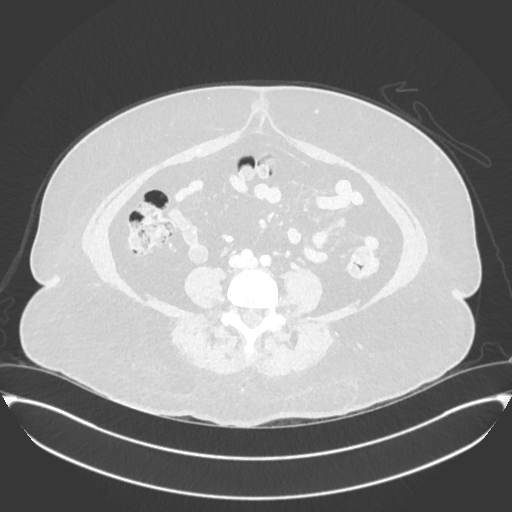
[im 254/254  soft-tissue]
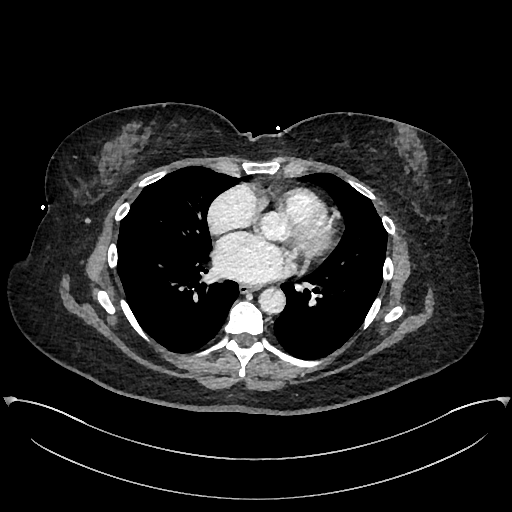
[im 254/254  lung]
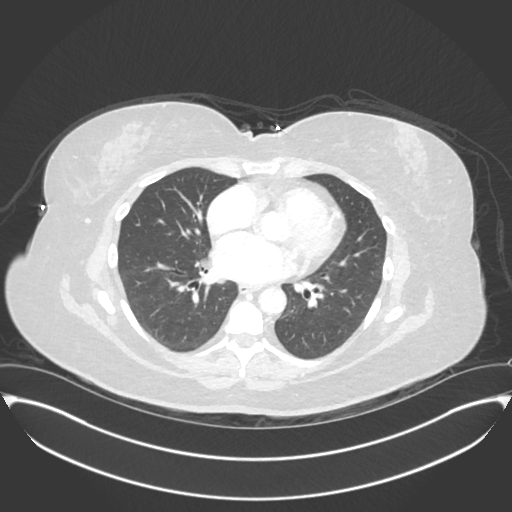

[Series 13: venous thins · axial · portal-venous · 0.86mm/px · z∈[+949,+1371]mm · 8 of 1268 slices shown]
[im 106/1268  soft-tissue]
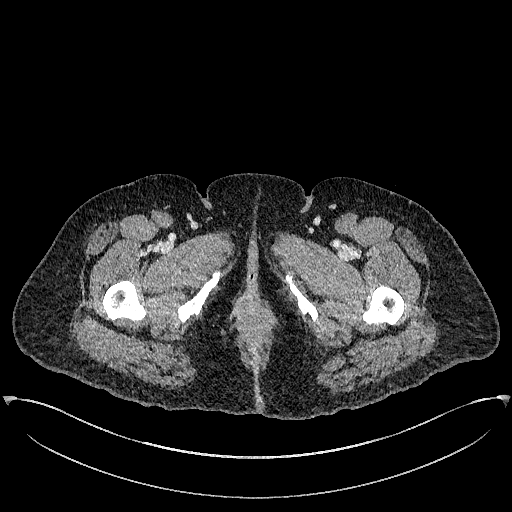
[im 317/1268  soft-tissue]
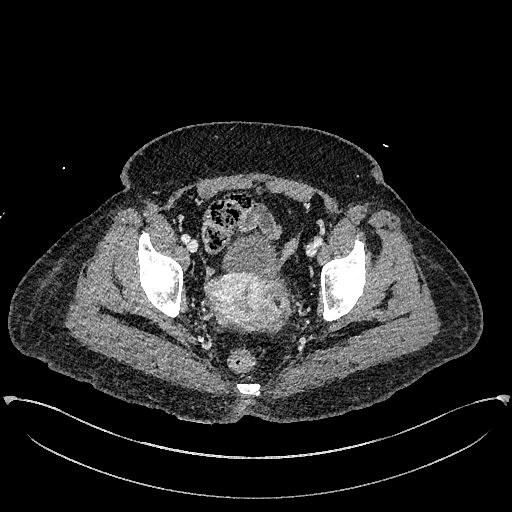
[im 423/1268  soft-tissue]
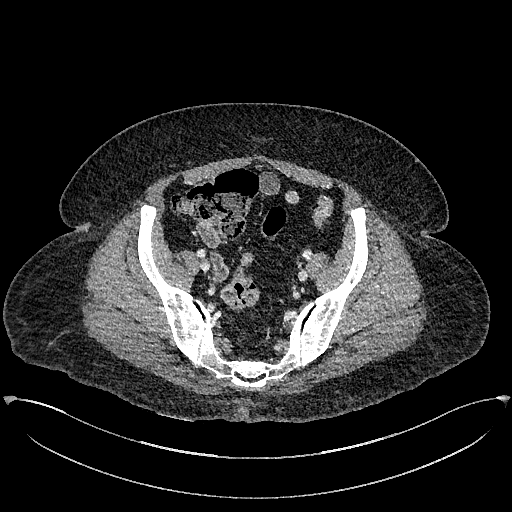
[im 528/1268  soft-tissue]
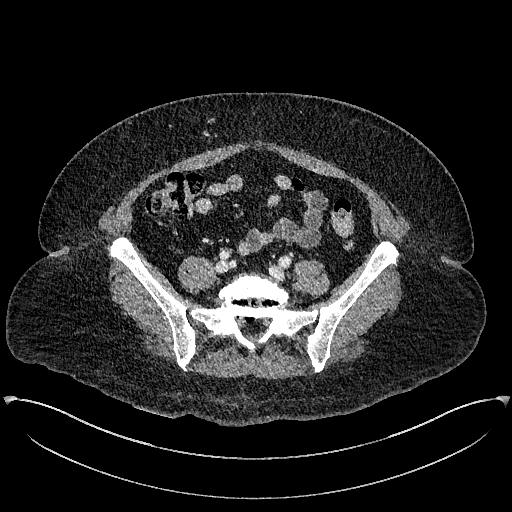
[im 740/1268  soft-tissue]
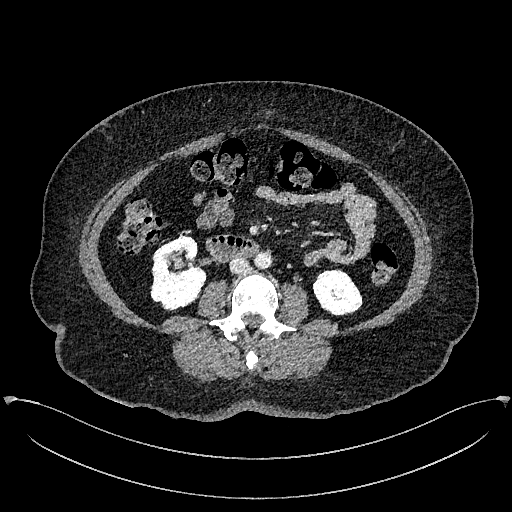
[im 845/1268  soft-tissue]
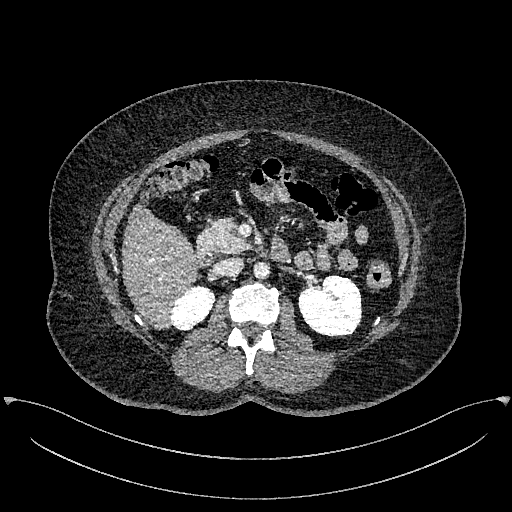
[im 951/1268  soft-tissue]
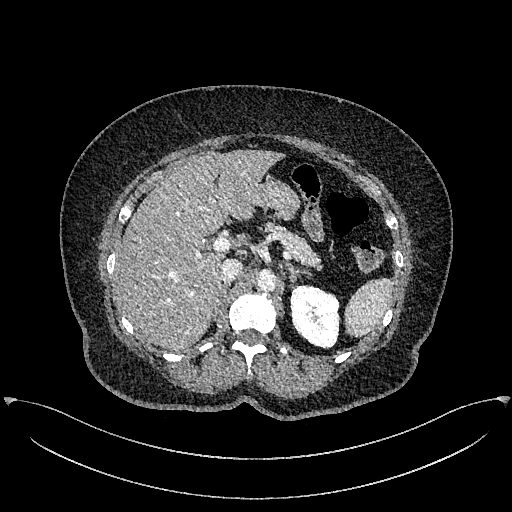
[im 1162/1268  soft-tissue]
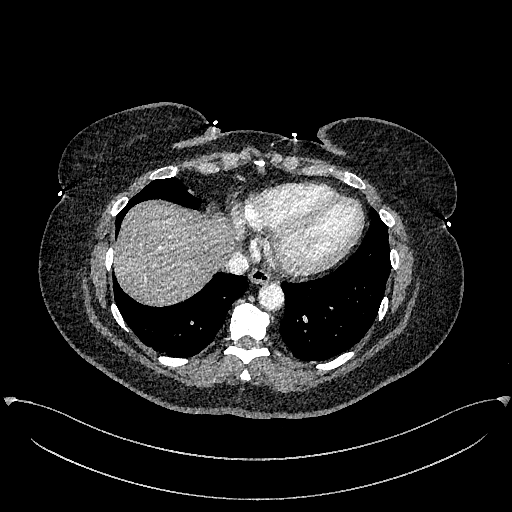

[13 of 46 positions shown; findings below may reference images not displayed]

FINDINGS: VASCULAR

Aorta: Normal

Celiac: Unremarkable

SMA: Unremarkable

Renals: Unremarkable

IMA: Unremarkable

Inflow: Unremarkable

Proximal Outflow: Unremarkable

Veins: Unremarkable

Review of the MIP images confirms the above findings.

NON-VASCULAR

Lower chest: No acute abnormality

Hepatobiliary: Hepatic steatosis noted without focal hepatic
abnormality. The patient is status post cholecystectomy. No evidence
of biliary dilatation.

Pancreas: Unremarkable

Spleen: Unremarkable

Adrenals/Urinary Tract: The kidneys, adrenal glands and bladder are
unremarkable.

Stomach/Bowel: Stomach is within normal limits. Appendix appears
normal. No evidence of bowel wall thickening, distention, or
inflammatory changes.

Lymphatic: No enlarged lymph nodes.

Reproductive: Uterus and bilateral adnexa are unremarkable.

Other: No ascites, focal collection or pneumoperitoneum.

Musculoskeletal: No acute or significant osseous findings.
IMPRESSION: VASCULAR

No abnormalities identified. No evidence of aortic aneurysm or
dissection.

NON-VASCULAR

Hepatic steatosis without other significant abnormality.

## 2020-05-12 DIAGNOSIS — M549 Dorsalgia, unspecified: Secondary | ICD-10-CM | POA: Diagnosis not present

## 2020-05-12 DIAGNOSIS — E1165 Type 2 diabetes mellitus with hyperglycemia: Secondary | ICD-10-CM | POA: Diagnosis not present

## 2020-05-12 DIAGNOSIS — I10 Essential (primary) hypertension: Secondary | ICD-10-CM | POA: Diagnosis not present

## 2020-05-12 DIAGNOSIS — E1142 Type 2 diabetes mellitus with diabetic polyneuropathy: Secondary | ICD-10-CM | POA: Diagnosis not present

## 2020-05-12 DIAGNOSIS — F324 Major depressive disorder, single episode, in partial remission: Secondary | ICD-10-CM | POA: Diagnosis not present

## 2020-05-12 DIAGNOSIS — K219 Gastro-esophageal reflux disease without esophagitis: Secondary | ICD-10-CM | POA: Diagnosis not present

## 2020-05-12 DIAGNOSIS — F419 Anxiety disorder, unspecified: Secondary | ICD-10-CM | POA: Diagnosis not present

## 2020-05-12 DIAGNOSIS — E7800 Pure hypercholesterolemia, unspecified: Secondary | ICD-10-CM | POA: Diagnosis not present

## 2020-05-21 DIAGNOSIS — Z6827 Body mass index (BMI) 27.0-27.9, adult: Secondary | ICD-10-CM | POA: Diagnosis not present

## 2020-05-21 DIAGNOSIS — Z01419 Encounter for gynecological examination (general) (routine) without abnormal findings: Secondary | ICD-10-CM | POA: Diagnosis not present

## 2020-07-13 DIAGNOSIS — Z1231 Encounter for screening mammogram for malignant neoplasm of breast: Secondary | ICD-10-CM | POA: Diagnosis not present

## 2020-07-13 DIAGNOSIS — Z7689 Persons encountering health services in other specified circumstances: Secondary | ICD-10-CM | POA: Diagnosis not present

## 2020-07-13 DIAGNOSIS — N950 Postmenopausal bleeding: Secondary | ICD-10-CM | POA: Diagnosis not present

## 2020-09-02 DIAGNOSIS — N840 Polyp of corpus uteri: Secondary | ICD-10-CM | POA: Diagnosis not present

## 2020-09-02 DIAGNOSIS — N950 Postmenopausal bleeding: Secondary | ICD-10-CM | POA: Diagnosis not present

## 2020-09-02 DIAGNOSIS — C541 Malignant neoplasm of endometrium: Secondary | ICD-10-CM | POA: Diagnosis not present

## 2020-09-02 DIAGNOSIS — D070 Carcinoma in situ of endometrium: Secondary | ICD-10-CM | POA: Diagnosis not present

## 2020-09-08 ENCOUNTER — Telehealth: Payer: Self-pay | Admitting: *Deleted

## 2020-09-08 NOTE — Telephone Encounter (Signed)
Attempted to reach the patient to schedule a new patient appt, left a message for the patient to call the office back

## 2020-09-09 NOTE — Telephone Encounter (Signed)
Attempted to reach the patient to schedule a new patient appt; left a message to call the office back 

## 2020-09-10 ENCOUNTER — Encounter: Payer: Self-pay | Admitting: Gynecologic Oncology

## 2020-09-11 ENCOUNTER — Inpatient Hospital Stay: Payer: Medicare Other

## 2020-09-11 ENCOUNTER — Other Ambulatory Visit: Payer: Self-pay

## 2020-09-11 ENCOUNTER — Telehealth: Payer: Self-pay

## 2020-09-11 ENCOUNTER — Inpatient Hospital Stay: Payer: Medicare Other | Attending: Gynecologic Oncology | Admitting: Gynecologic Oncology

## 2020-09-11 ENCOUNTER — Encounter: Payer: Self-pay | Admitting: Gynecologic Oncology

## 2020-09-11 VITALS — BP 220/110 | HR 79 | Temp 97.9°F | Resp 16 | Ht 64.0 in | Wt 174.8 lb

## 2020-09-11 DIAGNOSIS — I10 Essential (primary) hypertension: Secondary | ICD-10-CM | POA: Diagnosis not present

## 2020-09-11 DIAGNOSIS — F419 Anxiety disorder, unspecified: Secondary | ICD-10-CM | POA: Diagnosis not present

## 2020-09-11 DIAGNOSIS — Z794 Long term (current) use of insulin: Secondary | ICD-10-CM

## 2020-09-11 DIAGNOSIS — Z79899 Other long term (current) drug therapy: Secondary | ICD-10-CM | POA: Insufficient documentation

## 2020-09-11 DIAGNOSIS — C541 Malignant neoplasm of endometrium: Secondary | ICD-10-CM

## 2020-09-11 DIAGNOSIS — E1165 Type 2 diabetes mellitus with hyperglycemia: Secondary | ICD-10-CM

## 2020-09-11 LAB — COMPREHENSIVE METABOLIC PANEL
ALT: 12 U/L (ref 0–44)
AST: 13 U/L — ABNORMAL LOW (ref 15–41)
Albumin: 4 g/dL (ref 3.5–5.0)
Alkaline Phosphatase: 95 U/L (ref 38–126)
Anion gap: 8 (ref 5–15)
BUN: 14 mg/dL (ref 8–23)
CO2: 30 mmol/L (ref 22–32)
Calcium: 10 mg/dL (ref 8.9–10.3)
Chloride: 100 mmol/L (ref 98–111)
Creatinine, Ser: 0.8 mg/dL (ref 0.44–1.00)
GFR, Estimated: >60 mL/min (ref >60)
Glucose, Bld: 213 mg/dL — ABNORMAL HIGH (ref 70–99)
Potassium: 4.3 mmol/L (ref 3.5–5.1)
Sodium: 138 mmol/L (ref 135–145)
Total Bilirubin: 0.7 mg/dL (ref 0.3–1.2)
Total Protein: 7.7 g/dL (ref 6.5–8.1)

## 2020-09-11 LAB — HEMOGLOBIN A1C
Hgb A1c MFr Bld: 11.2 % — ABNORMAL HIGH (ref 4.8–5.6)
Mean Plasma Glucose: 274.74 mg/dL

## 2020-09-11 MED ORDER — MEGESTROL ACETATE 40 MG PO TABS: 80.0000 mg | ORAL_TABLET | Freq: Two times a day (BID) | ORAL | 6 refills | Status: DC

## 2020-09-11 NOTE — Progress Notes (Signed)
Consult Note: Gyn-Onc  Consult was requested by Dr. Radene Knee for the evaluation of Kimberly Hunt 65 y.o. female  CC:  Chief Complaint  Patient presents with  . Endometrial cancer [facility])    Assessment/Plan:  Kimberly Hunt  is a 65 y.o.  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  While I recommend definitive treatment with hysterectomy at some point for this patient, I do not feel that it is safe to proceed immediately with scheduling hysterectomy given her very poor blood pressure and blood glucose control placing her at substantially higher risk for complications that are preventable such as stroke, heart attack, surgical site infection, and healing abnormalities.  We will check hemoglobin A1c and c-Met today.  If her hemoglobin A1c is less than 8%, we could proceed with surgery after Dr. Kenton Kingfisher, her primary care provider, has optimized her blood pressure management.  If, however, her hemoglobin A1c is greater than 8% (which I suspect will be) she will need to discuss optimization of her insulin regiment with Dr. Kenton Kingfisher, or referral to an endocrinologist for this.  I will plan to see the patient back in 2-1/2 months for reevaluation of hemoglobin A1c (this will be scheduled the week prior to my visit so that we have this result available for decision making).  While it is unlikely to be completely normalized within that time, I would expect at least a downward trend with improved blood glucose control.  Would also want to see improved blood pressure measures with an optimized antihypertensive regimen.  If this is demonstrated when she returns, we will schedule her for a robotic assisted total hysterectomy with BSO and sentinel lymph node biopsy.  In the meantime we will treat Kimberly Hunt with progestin therapy.  I have prescribed 80 mg of Megace twice daily and explained that this will improve her bleeding symptoms and  stabilize the growth of her endometrial cancer.  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   Her symptoms began in 2019 with postmenopausal bleeding that was initially light.  When it became heavier in 2022 she contacted her gynecologist with whom she has regular visits, Dr. Radene Knee, and reported her bleeding symptoms.  She saw Dr Radene Knee for a problem visit on 07/13/2020.  Work-up of symptoms included ultrasound scan and hysteroscopy with D&C. Transvaginal US on 07/13/2020 showed a uterus measuring 7.35 x 3.24 x 4.9 cm with no adnexal masses.  There was small intramural fibroid seen.  There is a 9 mm polyp was seen in the endometrial cavity and a thickening in the fundal portion of the endometrium with a 9.6 mm endometrium. Endometrial sampling with hysteroscopy D&C in the operating room was performed on 09/02/2020 and showed endometrioid adenocarcinoma with focal squamous differentiation, FIGO grade 1-2.   The patient is obese with a BMI of 30 kg per metered squared.  He has very poorly controlled type 2 diabetes mellitus for which she takes long-term insulin and has associated complications of neuropathy and retinopathy.  Her last reported hemoglobin A1c was 13.2% on May 12, 2020 and this was following prescription of additional NovoLog insulin.  Her primary care physician, Dr. Shirline Frees, manages her diabetes.  She reported that she had not had a change in her insulin regimen since May 12, 2020 hemoglobin A1c  was 13.2%.  Additionally she has poorly controlled hypertensive and admits to intermittently taking her antihypertensive medication.  Her surgical history is most remarkable for a laparoscopic cholecystectomy.  She had never been pregnant.  Current Meds:  Outpatient Encounter Medications as of 09/11/2020  Medication Sig  . amLODipine (NORVASC) 5 MG tablet Take 1  tablet (5 mg total) by mouth daily.  . blood glucose meter kit and supplies KIT Dispense based on patient and insurance preference. Use up to four times daily as directed. (FOR ICD-9 250.00, 250.01).  Marland Kitchen HYDROcodone-acetaminophen (NORCO) 7.5-325 MG tablet Take 1 tablet by mouth 2 (two) times daily as needed for moderate pain.   Marland Kitchen insulin aspart (NOVOLOG FLEXPEN) 100 UNIT/ML FlexPen as directed up to 10 units  . irbesartan-hydrochlorothiazide (AVALIDE) 150-12.5 MG tablet Take 1 tablet by mouth daily.  Marland Kitchen LEVEMIR FLEXTOUCH 100 UNIT/ML Pen Inject 30 Units into the skin daily.  . megestrol (MEGACE) 40 MG tablet Take 2 tablets (80 mg total) by mouth 2 (two) times daily.  Marland Kitchen oxyCODONE-acetaminophen (PERCOCET) 7.5-325 MG tablet Take 1 tablet by mouth every 8 (eight) hours as needed.  . ALPRAZolam (XANAX) 0.25 MG tablet Take 0.25 mg by mouth daily as needed for anxiety.  (Patient not taking: Reported on 09/10/2020)  . AZO-CRANBERRY PO Take 2 tablets by mouth daily as needed (for urinary). (Patient not taking: Reported on 09/10/2020)  . cephALEXin (KEFLEX) 250 MG capsule Take 1 capsule (250 mg total) by mouth 4 (four) times daily. (Patient not taking: Reported on 09/10/2020)  . citalopram (CELEXA) 20 MG tablet Take 20 mg by mouth at bedtime. (Patient not taking: Reported on 09/10/2020)  . citalopram (CELEXA) 40 MG tablet Take 1 tablet by mouth every evening.  . gabapentin (NEURONTIN) 300 MG capsule Take 1 capsule by mouth at bedtime.  Marland Kitchen HYDROcodone-acetaminophen (NORCO/VICODIN) 5-325 MG per tablet Take 1 tablet by mouth every 6 (six) hours as needed for moderate pain. (Patient not taking: No sig reported)  . Insulin Glargine (LANTUS) 100 UNIT/ML Solostar Pen Inject 30 Units into the skin daily. Starting 06/05/14 at [place]. (Patient not taking: No sig reported)  . LORazepam (ATIVAN) 0.5 MG tablet Take 0.5 mg by mouth 2 (two) times daily as needed.  Marland Kitchen OVER THE COUNTER MEDICATION Apply 1 application topically daily as  needed (for back pain).  Marland Kitchen zolpidem (AMBIEN) 10 MG tablet Take 10 mg by mouth at bedtime as needed for sleep.  (Patient not taking: Reported on 09/10/2020)   No facility-administered encounter medications on file as of 09/11/2020.    Allergy:  Allergies  Allergen Reactions  . Shrimp [Shellfish Allergy]     Hives     Social Hx:   Social History   Socioeconomic History  . Marital status: Single    Spouse name: Not on file  . Number of children: Not on file  . Years of education: Not on file  . Highest education level: Not on file  Occupational History  . Not on file  Tobacco Use  . Smoking status: Never Smoker  . Smokeless tobacco: Never Used  Substance and Sexual Activity  . Alcohol use: No  . Drug use: No  . Sexual activity: Not Currently    Birth control/protection: Post-menopausal  Other Topics Concern  . Not on file  Social History Narrative  . Not on file   Social Determinants of Health   Financial Resource Strain: Not on file  Food Insecurity: Not on file  Transportation Needs: Not on file  Physical Activity: Not on file  Stress: Not on file  Social Connections: Not on file  Intimate Partner Violence: Not on file    Past Surgical Hx:  Past Surgical History:  Procedure Laterality Date  . CERVICAL POLYPECTOMY    . CHOLECYSTECTOMY    . COLONOSCOPY W/ POLYPECTOMY    . TONSILLECTOMY      Past Medical Hx:  Past Medical History:  Diagnosis Date  . Anxiety   . Diabetes mellitus without complication ([place])   . Hypertension   . Pancreatitis     Past Gynecological History: endometrial cancer. G0. No LMP recorded. Patient is postmenopausal.  Family Hx:  Family History  Problem Relation Age of Onset  . Breast cancer Mother     Review of Systems:  Constitutional  Feels well,    ENT Normal appearing ears and nares bilaterally Skin/Breast  No rash, sores, jaundice, itching, dryness Cardiovascular  No chest pain, shortness of breath, or edema   Pulmonary  No cough or wheeze.  Gastro Intestinal  No nausea, vomitting, or diarrhoea. No bright red blood per rectum, no abdominal pain, change in bowel movement, or constipation.  Genito Urinary  No frequency, urgency, dysuria, + postmenopausal bleeding Musculo Skeletal  No myalgia, arthralgia, joint swelling or pain  Neurologic  No weakness, numbness, change in gait,  Psychology  No depression, anxiety, insomnia.   Vitals:  Blood pressure (!) 220/110, pulse 79, temperature 97.9 F (36.6 C), temperature source Tympanic, resp. rate 16, height _0  (1.626 m), weight 174 lb 12.8 oz (79.3 kg), SpO2 98 %.  Physical Exam: WD in NAD Neck  Supple NROM, without any enlargements.  Lymph Node Survey No cervical supraclavicular or inguinal adenopathy Cardiovascular  Pulse normal rate, regularity and rhythm. S1 and S2 normal.  Lungs  Clear to auscultation bilateraly, without wheezes/crackles/rhonchi. Good air movement.  Skin  No rash/lesions/breakdown  Psychiatry  Alert and oriented to person, place, and time  Abdomen  Normoactive bowel sounds, abdomen soft, non-tender and mildly obese without evidence of hernia.  Back No CVA tenderness Genito Urinary  Vulva/vagina: Normal external female genitalia.  No lesions. No discharge or bleeding.  Bladder/urethra:  No lesions or masses, well supported bladder  Vagina: normal  Cervix: Normal appearing, no lesions.  Uterus: Mildly enlarged, minimally mobile, no parametrial involvement or nodularity.  Adnexa: no palpable masses. Rectal  deferred Extremities  No bilateral cyanosis, clubbing or edema.  60 minutes of total time was spent for this patient encounter, including preparation, face-to-face counseling with the patient and coordination of care, and documentation of the encounter.   Thereasa Solo, MD  09/11/2020, 12:05 PM

## 2020-09-11 NOTE — Telephone Encounter (Signed)
Told Kimberly Hunt that her Hgb A1c was 11.2.  Her kidney function is WNL.  Her blood sugar was high at 213. These lab results were faxed to Benjiman Core, NP for her appointment Monday 09-14-20. Pt verbalized understanding.

## 2020-09-11 NOTE — Telephone Encounter (Signed)
Set appointment with Catha Gosselin for Monday 09-14-20 at 1145 am with an arrival at 1130 to discuss optimization  for hypertension and diabetes as per Dr. Denman George note 09-11-20. Elmo Putt RN Gyn Oncology Nurse Navigator  will f/u with patient to help with medication and appointment adherence. Faxed Dr. Serita Grit office note from 09-11-20 to Benjiman Core, NP for visit 09-14-20.

## 2020-09-11 NOTE — Patient Instructions (Addendum)
Dr. Denman George does not feel that it is safe to proceed with surgery immediately due to your very high blood glucoses and your very high blood pressure.  She requires Dr. Kenton Kingfisher to change her blood pressure medicine and your diabetes medicine to improve these parameters prior to proceeding with hysterectomy.  In the meantime she has prescribed a medicine called Megace for you to take morning and night.  This is a hormonal treatment which treats the endometrial cancer to stop it from growing while we are awaiting surgery.  Dr. Denman George will order laboratory tests today to check the diabetes level and your kidney function.  Her office will connect with Dr. Kenton Kingfisher to ensure you follow-up with him to improve your medications for blood pressure and blood sugar control.  Dr. Denman George will see you back in the office in 2-1/2 months (after you have had a repeat diabetes test) to ensure that your blood sugars are improving and your blood pressure is better controlled.  If this is the case at that time, she will schedule a hysterectomy to treat your cancer.

## 2020-09-14 DIAGNOSIS — C541 Malignant neoplasm of endometrium: Secondary | ICD-10-CM | POA: Diagnosis not present

## 2020-09-14 DIAGNOSIS — E7800 Pure hypercholesterolemia, unspecified: Secondary | ICD-10-CM | POA: Diagnosis not present

## 2020-09-14 DIAGNOSIS — E1165 Type 2 diabetes mellitus with hyperglycemia: Secondary | ICD-10-CM | POA: Diagnosis not present

## 2020-09-14 DIAGNOSIS — F324 Major depressive disorder, single episode, in partial remission: Secondary | ICD-10-CM | POA: Diagnosis not present

## 2020-09-14 DIAGNOSIS — F419 Anxiety disorder, unspecified: Secondary | ICD-10-CM | POA: Diagnosis not present

## 2020-09-14 DIAGNOSIS — E1142 Type 2 diabetes mellitus with diabetic polyneuropathy: Secondary | ICD-10-CM | POA: Diagnosis not present

## 2020-09-14 DIAGNOSIS — I10 Essential (primary) hypertension: Secondary | ICD-10-CM | POA: Diagnosis not present

## 2020-09-14 DIAGNOSIS — K219 Gastro-esophageal reflux disease without esophagitis: Secondary | ICD-10-CM | POA: Diagnosis not present

## 2020-09-14 DIAGNOSIS — M549 Dorsalgia, unspecified: Secondary | ICD-10-CM | POA: Diagnosis not present

## 2020-09-16 ENCOUNTER — Telehealth: Payer: Self-pay

## 2020-09-16 NOTE — Telephone Encounter (Signed)
Received phone call from Kimberly Hunt seeking clarification about how to take her megace prescription. Instructed her to take 2, 40 mg tablets twice daily as it was prescribed. She verbalized understanding and will call back with any questions or concerns.

## 2020-09-23 DIAGNOSIS — Z6829 Body mass index (BMI) 29.0-29.9, adult: Secondary | ICD-10-CM | POA: Diagnosis not present

## 2020-09-23 DIAGNOSIS — Z8719 Personal history of other diseases of the digestive system: Secondary | ICD-10-CM | POA: Diagnosis not present

## 2020-09-23 DIAGNOSIS — E1140 Type 2 diabetes mellitus with diabetic neuropathy, unspecified: Secondary | ICD-10-CM | POA: Diagnosis not present

## 2020-09-23 DIAGNOSIS — E1165 Type 2 diabetes mellitus with hyperglycemia: Secondary | ICD-10-CM | POA: Diagnosis not present

## 2020-10-19 DIAGNOSIS — E1165 Type 2 diabetes mellitus with hyperglycemia: Secondary | ICD-10-CM | POA: Diagnosis not present

## 2020-10-19 DIAGNOSIS — Z8719 Personal history of other diseases of the digestive system: Secondary | ICD-10-CM | POA: Diagnosis not present

## 2020-10-19 DIAGNOSIS — E1140 Type 2 diabetes mellitus with diabetic neuropathy, unspecified: Secondary | ICD-10-CM | POA: Diagnosis not present

## 2020-10-19 DIAGNOSIS — Z6829 Body mass index (BMI) 29.0-29.9, adult: Secondary | ICD-10-CM | POA: Diagnosis not present

## 2020-10-20 ENCOUNTER — Telehealth: Payer: Self-pay

## 2020-10-20 NOTE — Telephone Encounter (Signed)
Faxed complete Prescriber Response Form to [facility] at [phone].

## 2020-11-19 ENCOUNTER — Inpatient Hospital Stay: Payer: Medicare Other | Attending: Gynecologic Oncology

## 2020-11-19 ENCOUNTER — Telehealth: Payer: Self-pay

## 2020-11-19 NOTE — Telephone Encounter (Signed)
Returned Kimberly Hunt's phone call re: rescheduling lab appointment for today. Left voicemail requesting a return call.

## 2020-11-24 ENCOUNTER — Inpatient Hospital Stay: Payer: Medicare Other | Attending: Gynecologic Oncology

## 2020-11-24 ENCOUNTER — Encounter: Payer: Self-pay | Admitting: Gynecologic Oncology

## 2020-11-24 ENCOUNTER — Other Ambulatory Visit: Payer: Self-pay

## 2020-11-24 ENCOUNTER — Inpatient Hospital Stay: Payer: Medicare Other | Attending: Gynecologic Oncology | Admitting: Gynecologic Oncology

## 2020-11-24 VITALS — BP 156/66 | HR 80 | Resp 18 | Ht 64.0 in | Wt 184.4 lb

## 2020-11-24 DIAGNOSIS — Z79899 Other long term (current) drug therapy: Secondary | ICD-10-CM | POA: Insufficient documentation

## 2020-11-24 DIAGNOSIS — F419 Anxiety disorder, unspecified: Secondary | ICD-10-CM | POA: Insufficient documentation

## 2020-11-24 DIAGNOSIS — E669 Obesity, unspecified: Secondary | ICD-10-CM | POA: Insufficient documentation

## 2020-11-24 DIAGNOSIS — E1140 Type 2 diabetes mellitus with diabetic neuropathy, unspecified: Secondary | ICD-10-CM | POA: Insufficient documentation

## 2020-11-24 DIAGNOSIS — Z79818 Long term (current) use of other agents affecting estrogen receptors and estrogen levels: Secondary | ICD-10-CM | POA: Insufficient documentation

## 2020-11-24 DIAGNOSIS — E11319 Type 2 diabetes mellitus with unspecified diabetic retinopathy without macular edema: Secondary | ICD-10-CM | POA: Diagnosis not present

## 2020-11-24 DIAGNOSIS — C541 Malignant neoplasm of endometrium: Secondary | ICD-10-CM | POA: Insufficient documentation

## 2020-11-24 DIAGNOSIS — Z6830 Body mass index (BMI) 30.0-30.9, adult: Secondary | ICD-10-CM | POA: Insufficient documentation

## 2020-11-24 DIAGNOSIS — I10 Essential (primary) hypertension: Secondary | ICD-10-CM | POA: Insufficient documentation

## 2020-11-24 DIAGNOSIS — E1165 Type 2 diabetes mellitus with hyperglycemia: Secondary | ICD-10-CM | POA: Diagnosis not present

## 2020-11-24 DIAGNOSIS — Z9114 Patient's other noncompliance with medication regimen: Secondary | ICD-10-CM | POA: Insufficient documentation

## 2020-11-24 DIAGNOSIS — Z794 Long term (current) use of insulin: Secondary | ICD-10-CM

## 2020-11-24 LAB — HEMOGLOBIN A1C
Hgb A1c MFr Bld: 9.8 % — ABNORMAL HIGH (ref 4.8–5.6)
Mean Plasma Glucose: 234.56 mg/dL

## 2020-11-24 NOTE — Patient Instructions (Signed)
Dr Denman George will recheck your HbA1c today. If it is consistently downwardly trending and close to 8%, we would consider surgery (hysterectomy) for you.    This will take place on September 20th.  You will return to see Joylene John, NP for a preoperative visit prior to that surgery.

## 2020-11-24 NOTE — Progress Notes (Signed)
Follow-up Note: Gyn-Onc  Consult was requested by Dr. Radene Knee for the evaluation of Allegra Lai 65 y.o. female  CC:  Chief Complaint  Patient presents with   Endometrial cancer [facility])    Assessment/Plan:  Ms. FLORDIA KASSEM  is a 65 y.o.  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  I explained that her very poor blood pressure and blood glucose control placing her at substantially higher risk for complications that are preventable such as stroke, heart attack, surgical site infection, and healing abnormalities.  We will recheck hemoglobin A1c.  If her hemoglobin A1c is less than 10%, we will proceed with surgery as this demonstrates a downward trend and improved control. However, if >10%, I would recommend returning to see her diabetes management doctor for ongoing optimization.  We will tentatively schedule her for a robotic assisted total hysterectomy with BSO and sentinel lymph node biopsy for 01/12/21.  In the meantime she will continue progestin therapy.  I have prescribed 80 mg of Megace twice daily.  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   Her symptoms began in 2019 with postmenopausal bleeding that was initially light.  When it became heavier in 2022 she contacted her gynecologist with whom she has regular visits, Dr. Radene Knee, and reported her bleeding symptoms.  She saw Dr Radene Knee for a problem visit on 07/13/2020.  Work-up of symptoms included ultrasound scan and hysteroscopy with D&C. Transvaginal US on 07/13/2020 showed a uterus measuring 7.35 x 3.24 x 4.9 cm with no adnexal masses.  There was small intramural fibroid seen.  There is a 9 mm polyp was seen in the endometrial cavity and a thickening in the fundal portion of the endometrium with a 9.6 mm endometrium. Endometrial sampling  with hysteroscopy D&C in the operating room was performed on 09/02/2020 and showed endometrioid adenocarcinoma with focal squamous differentiation, FIGO grade 1-2.   The patient is obese with a BMI of 30 kg per metered squared.  He has very poorly controlled type 2 diabetes mellitus for which she takes long-term insulin and has associated complications of neuropathy and retinopathy.  Her last reported hemoglobin A1c was 13.2% on May 12, 2020 and this was following prescription of additional NovoLog insulin.  Her primary care physician, Dr. Shirline Frees, manages her diabetes.  She reported that she had not had a change in her insulin regimen since May 12, 2020 hemoglobin A1c was 13.2%.  Additionally she has poorly controlled hypertensive and admits to intermittently taking her antihypertensive medication.  Her surgical history is most remarkable for a laparoscopic cholecystectomy.  She had never been pregnant.  Interval Hx:  Her HbA1 C at the time of diagnosis on 09/11/20 was 11.2% and considered reflective of very poor BS control. She was treated with oral progestins and hysterectomy was delayed in order to facilitate optimization of her blood pressure and blood glucose.  She reported that her PCP increased her dosage of insulin and added metformin.  She reported having somewhat improved blood glucose measures since that time, but admits to flares of hyperglycemia following poor diet choices.  Current Meds:  Outpatient Encounter Medications as of 11/24/2020  Medication Sig   ALPRAZolam (XANAX) 0.25 MG tablet Take 0.25 mg by mouth daily as needed for anxiety.  (Patient not taking: Reported on  09/10/2020)   amLODipine (NORVASC) 10 MG tablet Take 1 tablet by mouth daily.   AZO-CRANBERRY PO Take 2 tablets by mouth daily as needed (for urinary). (Patient not taking: Reported on 09/10/2020)   blood glucose meter kit and supplies KIT Dispense based on patient and insurance preference. Use up to four  times daily as directed. (FOR ICD-9 250.00, 250.01).   cephALEXin (KEFLEX) 250 MG capsule Take 1 capsule (250 mg total) by mouth 4 (four) times daily. (Patient not taking: Reported on 09/10/2020)   citalopram (CELEXA) 20 MG tablet Take 20 mg by mouth at bedtime. (Patient not taking: Reported on 09/10/2020)   citalopram (CELEXA) 40 MG tablet Take 1 tablet by mouth every evening.   gabapentin (NEURONTIN) 300 MG capsule Take 1 capsule by mouth at bedtime.   HYDROcodone-acetaminophen (NORCO) 7.5-325 MG tablet Take 1 tablet by mouth 2 (two) times daily as needed for moderate pain.    insulin aspart (NOVOLOG FLEXPEN) 100 UNIT/ML FlexPen as directed up to 10 units   Insulin Glargine (LANTUS) 100 UNIT/ML Solostar Pen Inject 30 Units into the skin daily. Starting 06/05/14 at [facility]. (Patient not taking: No sig reported)   irbesartan-hydrochlorothiazide (AVALIDE) 300-12.5 MG tablet Take 1 tablet by mouth daily.   LEVEMIR FLEXTOUCH 100 UNIT/ML Pen Inject 30 Units into the skin daily.   LORazepam (ATIVAN) 0.5 MG tablet Take 0.5 mg by mouth 2 (two) times daily as needed.   megestrol (MEGACE) 40 MG tablet Take 2 tablets (80 mg total) by mouth 2 (two) times daily.   OVER THE COUNTER MEDICATION Apply 1 application topically daily as needed (for back pain).   oxyCODONE-acetaminophen (PERCOCET) 7.5-325 MG tablet Take 1 tablet by mouth every 8 (eight) hours as needed.   pantoprazole (PROTONIX) 40 MG tablet Take 1 tablet by mouth daily.   rosuvastatin (CRESTOR) 10 MG tablet Take 1 tablet by mouth once a week.   zolpidem (AMBIEN) 10 MG tablet Take 10 mg by mouth at bedtime as needed for sleep.  (Patient not taking: Reported on 09/10/2020)   No facility-administered encounter medications on file as of 11/24/2020.    Allergy:  Allergies  Allergen Reactions   Shrimp [Shellfish Allergy]     Hives     Social Hx:   Social History   Socioeconomic History   Marital status: Single    Spouse name: Not on file   Number of  children: Not on file   Years of education: Not on file   Highest education level: Not on file  Occupational History   Not on file  Tobacco Use   Smoking status: Never   Smokeless tobacco: Never  Substance and Sexual Activity   Alcohol use: No   Drug use: No   Sexual activity: Not Currently    Birth control/protection: Post-menopausal  Other Topics Concern   Not on file  Social History Narrative   Not on file   Social Determinants of Health   Financial Resource Strain: Not on file  Food Insecurity: Not on file  Transportation Needs: Not on file  Physical Activity: Not on file  Stress: Not on file  Social Connections: Not on file  Intimate Partner Violence: Not on file    Past Surgical Hx:  Past Surgical History:  Procedure Laterality Date   CERVICAL POLYPECTOMY     CHOLECYSTECTOMY     COLONOSCOPY W/ POLYPECTOMY     TONSILLECTOMY      Past Medical Hx:  Past Medical History:  Diagnosis Date  Anxiety    Diabetes mellitus without complication ([place])    Hypertension    Pancreatitis     Past Gynecological History: endometrial cancer. G0. No LMP recorded. Patient is postmenopausal.  Family Hx:  Family History  Problem Relation Age of Onset   Breast cancer Mother     Review of Systems:  Constitutional  Feels well,    ENT Normal appearing ears and nares bilaterally Skin/Breast  No rash, sores, jaundice, itching, dryness Cardiovascular  No chest pain, shortness of breath, or edema  Pulmonary  No cough or wheeze.  Gastro Intestinal  No nausea, vomitting, or diarrhoea. No bright red blood per rectum, no abdominal pain, change in bowel movement, or constipation.  Genito Urinary  No frequency, urgency, dysuria, + postmenopausal bleeding Musculo Skeletal  No myalgia, arthralgia, joint swelling or pain  Neurologic  No weakness, numbness, change in gait,  Psychology  No depression, anxiety, insomnia.   Vitals:  Blood pressure (!) 156/66, pulse 80, resp.  rate 18, height _0  (1.626 m), weight 184 lb 6 oz (83.6 kg), SpO2 100 %.  Physical Exam: WD in NAD Neck  Supple NROM, without any enlargements.  Lymph Node Survey No cervical supraclavicular or inguinal adenopathy Cardiovascular  Pulse normal rate, regularity and rhythm. S1 and S2 normal.  Lungs  Clear to auscultation bilateraly, without wheezes/crackles/rhonchi. Good air movement.  Skin  No rash/lesions/breakdown  Psychiatry  Alert and oriented to person, place, and time  Abdomen  Normoactive bowel sounds, abdomen soft, non-tender and mildly obese without evidence of hernia.  Back No CVA tenderness Genito Urinary  Vulva/vagina: Normal external female genitalia.  No lesions. No discharge or bleeding.  Bladder/urethra:  No lesions or masses, well supported bladder  Vagina: normal  Cervix: Normal appearing, no lesions.  Uterus: Mildly enlarged, minimally mobile, no parametrial involvement or nodularity.  Adnexa: no palpable masses. Rectal  deferred Extremities  No bilateral cyanosis, clubbing or edema.   Thereasa Solo, MD  11/24/2020, 1:40 PM

## 2020-11-25 ENCOUNTER — Telehealth: Payer: Self-pay

## 2020-11-25 NOTE — Telephone Encounter (Signed)
Told Kimberly Hunt that her Hgb A1c was 9.8 yesterday.   Since it is less then 10 Dr. Denman George will proceed with surgery.  She needs to continue working on blood sugar control.  Will plan on surgery for 01-12-21. Told her that if her blood sugar was high the day of surgery that her surgery could be cancelled.  Pt verbalized understanding.

## 2020-11-25 NOTE — Telephone Encounter (Signed)
LM for Ms Hartong to call back to discuss the results of her Hgb A1c.

## 2020-12-15 DIAGNOSIS — E7800 Pure hypercholesterolemia, unspecified: Secondary | ICD-10-CM | POA: Diagnosis not present

## 2020-12-15 DIAGNOSIS — E1165 Type 2 diabetes mellitus with hyperglycemia: Secondary | ICD-10-CM | POA: Diagnosis not present

## 2020-12-15 DIAGNOSIS — F419 Anxiety disorder, unspecified: Secondary | ICD-10-CM | POA: Diagnosis not present

## 2020-12-15 DIAGNOSIS — C541 Malignant neoplasm of endometrium: Secondary | ICD-10-CM | POA: Diagnosis not present

## 2020-12-15 DIAGNOSIS — M549 Dorsalgia, unspecified: Secondary | ICD-10-CM | POA: Diagnosis not present

## 2020-12-15 DIAGNOSIS — I10 Essential (primary) hypertension: Secondary | ICD-10-CM | POA: Diagnosis not present

## 2020-12-21 ENCOUNTER — Telehealth: Payer: Self-pay

## 2020-12-21 NOTE — Telephone Encounter (Signed)
Left message for Kimberly Hunt requesting a return call. Need to schedule a pre-op appointment with Joylene John, NP on 01/07/21.

## 2020-12-25 NOTE — Telephone Encounter (Signed)
LM for Ms Enslin to call back to the office to schedule a pre-op appointment with Zoila Shutter on 01-06-21 at 1:30 if possible.

## 2020-12-31 NOTE — Patient Instructions (Addendum)
DUE TO COVID-19 ONLY ONE VISITOR IS ALLOWED TO COME WITH YOU AND STAY IN THE WAITING ROOM ONLY DURING PRE OP AND PROCEDURE.   **NO VISITORS ARE ALLOWED IN THE SHORT STAY AREA OR RECOVERY ROOM!!**        Your procedure is scheduled on:  Tuesday, 01-12-21   Report to [facility] Main  Entrance   Report to admitting at  8:45 AM   Call this number if you have problems the morning of surgery [phone]   Follow a light diet day before surgery (avoid gas producing foods)   Do not eat food :After Midnight.   May have liquids until 8:00 AM  day of surgery  CLEAR LIQUID DIET  Foods Allowed                                                                     Foods Excluded  Water, Black Coffee (no milk/no creamer) and tea, regular and decaf                              liquids that you cannot  Plain Jell-O in any flavor  (No red)                         see through such as: Fruit ices (not with fruit pulp)                                 milk, soups, orange juice  Iced Popsicles (No red)                                    All solid food                             Apple juices Sports drinks like Gatorade (No red) Lightly seasoned clear broth or consume(fat free) Sugar     Oral Hygiene is also important to reduce your risk of infection.                                    Remember - BRUSH YOUR TEETH THE MORNING OF SURGERY WITH YOUR REGULAR TOOTHPASTE   Do NOT smoke after Midnight   Take these medicines the morning of surgery with A SIP OF WATER:  Amlodipine, Lorazepam, Megestrol, Pantoprazole, Rosuvastatin   Stop all vitamins and herbal supplements a week before surgery   How to Manage Your Diabetes Before and After Surgery  Why is it important to control my blood sugar before and after surgery? Improving blood sugar levels before and after surgery helps healing and can limit problems. A way of improving blood sugar control is eating a healthy diet by:  Eating less sugar  and carbohydrates  Increasing activity/exercise  Talking with your doctor about reaching your blood sugar goals High blood sugars (greater than 180 mg/dL) can raise your risk of infections and slow your recovery, so  you will need to focus on controlling your diabetes during the weeks before surgery. Make sure that the doctor who takes care of your diabetes knows about your planned surgery including the date and location.  How do I manage my blood sugar before surgery? Check your blood sugar at least 4 times a day, starting 2 days before surgery, to make sure that the level is not too high or low. Check your blood sugar the morning of your surgery when you wake up and every 2 hours until you get to the Short Stay unit. If your blood sugar is less than 70 mg/dL, you will need to treat for low blood sugar: Do not take insulin. Treat a low blood sugar (less than 70 mg/dL) with  cup of clear juice (cranberry or apple), 4 glucose tablets, OR glucose gel. Recheck blood sugar in 15 minutes after treatment (to make sure it is greater than 70 mg/dL). If your blood sugar is not greater than 70 mg/dL on recheck, call [phone] for further instructions. Report your blood sugar to the short stay nurse when you get to Short Stay.  If you are admitted to the hospital after surgery: Your blood sugar will be checked by the staff and you will probably be given insulin after surgery (instead of oral diabetes medicines) to make sure you have good blood sugar levels. The goal for blood sugar control after surgery is 80-180 mg/dL.   WHAT DO I DO ABOUT MY DIABETES MEDICATION?  Do not take oral diabetes medicines (pills) the morning of surgery.  THE DAY BEFORE SURGERY:  toujeo take as usual      THE MORNING OF SURGERY:  toujeu take 50% of normal dose 22 units  Reviewed and Endorsed by [facility] Patient Education Committee, August 2015              You may not have any metal on your body including hair  pins, jewelry, and body piercing             Do not wear make-up, lotions, powders, perfumes or deodorant  Do not wear nail polish including gel and S&S, artificial/acrylic nails, or any other type of covering on natural nails including finger and toenails. If you have artificial nails, gel coating, etc. that needs to be removed by a nail salon please have this removed prior to surgery or surgery may need to be canceled/ delayed if the surgeon/ anesthesia feels like they are unable to be safely monitored.   Do not shave  48 hours prior to surgery.               Men may shave face and neck.  Do not bring valuables to the hospital. [place].   Contacts, dentures or bridgework may not be worn into surgery.     Patients discharged the day of surgery will not be allowed to drive home.  Special Instructions: Bring a copy of your healthcare power of attorney and living will documents the day of surgery if you haven't scanned them in before.  Please read over the following fact sheets you were given: IF YOU HAVE QUESTIONS ABOUT YOUR PRE OP INSTRUCTIONS PLEASE CALL [phone]   [facility] - Preparing for Surgery Before surgery, you can play an important role.  Because skin is not sterile, your skin needs to be as free of germs as possible.  You can reduce the number of germs on your skin by washing with  CHG (chlorahexidine gluconate) soap before surgery.  CHG is an antiseptic cleaner which kills germs and bonds with the skin to continue killing germs even after washing. Please DO NOT use if you have an allergy to CHG or antibacterial soaps.  If your skin becomes reddened/irritated stop using the CHG and inform your nurse when you arrive at Short Stay. Do not shave (including legs and underarms) for at least 48 hours prior to the first CHG shower.  You may shave your face/neck.  Please follow these instructions carefully:  1.  Shower with CHG Soap the night  before surgery and the  morning of surgery.  2.  If you choose to wash your hair, wash your hair first as usual with your normal  shampoo.  3.  After you shampoo, rinse your hair and body thoroughly to remove the shampoo.                             4.  Use CHG as you would any other liquid soap.  You can apply chg directly to the skin and wash.  Gently with a scrungie or clean washcloth.  5.  Apply the CHG Soap to your body ONLY FROM THE NECK DOWN.   Do   not use on face/ open                           Wound or open sores. Avoid contact with eyes, ears mouth and   genitals (private parts).                       Wash face,  Genitals (private parts) with your normal soap.             6.  Wash thoroughly, paying special attention to the area where your    surgery  will be performed.  7.  Thoroughly rinse your body with warm water from the neck down.  8.  DO NOT shower/wash with your normal soap after using and rinsing off the CHG Soap.                9.  Pat yourself dry with a clean towel.            10.  Wear clean pajamas.            11.  Place clean sheets on your bed the night of your first shower and do not  sleep with pets. Day of Surgery : Do not apply any lotions/deodorants the morning of surgery.  Please wear clean clothes to the hospital/surgery center.  FAILURE TO FOLLOW THESE INSTRUCTIONS MAY RESULT IN THE CANCELLATION OF YOUR SURGERY  PATIENT SIGNATURE_________________________________  NURSE SIGNATURE__________________________________  ________________________________________________________________________  WHAT IS A BLOOD TRANSFUSION? Blood Transfusion Information  A transfusion is the replacement of blood or some of its parts. Blood is made up of multiple cells which provide different functions. Red blood cells carry oxygen and are used for blood loss replacement. White blood cells fight against infection. Platelets control bleeding. Plasma helps clot blood. Other blood  products are available for specialized needs, such as hemophilia or other clotting disorders. BEFORE THE TRANSFUSION  Who gives blood for transfusions?  Healthy volunteers who are fully evaluated to make sure their blood is safe. This is blood bank blood. Transfusion therapy is the safest it has ever been in the practice of medicine. Before  blood is taken from a donor, a complete history is taken to make sure that person has no history of diseases nor engages in risky social behavior (examples are intravenous drug use or sexual activity with multiple partners). The donor's travel history is screened to minimize risk of transmitting infections, such as malaria. The donated blood is tested for signs of infectious diseases, such as HIV and hepatitis. The blood is then tested to be sure it is compatible with you in order to minimize the chance of a transfusion reaction. If you or a relative donates blood, this is often done in anticipation of surgery and is not appropriate for emergency situations. It takes many days to process the donated blood. RISKS AND COMPLICATIONS Although transfusion therapy is very safe and saves many lives, the main dangers of transfusion include:  Getting an infectious disease. Developing a transfusion reaction. This is an allergic reaction to something in the blood you were given. Every precaution is taken to prevent this. The decision to have a blood transfusion has been considered carefully by your caregiver before blood is given. Blood is not given unless the benefits outweigh the risks. AFTER THE TRANSFUSION Right after receiving a blood transfusion, you will usually feel much better and more energetic. This is especially true if your red blood cells have gotten low (anemic). The transfusion raises the level of the red blood cells which carry oxygen, and this usually causes an energy increase. The nurse administering the transfusion will monitor you carefully for  complications. HOME CARE INSTRUCTIONS  No special instructions are needed after a transfusion. You may find your energy is better. Speak with your caregiver about any limitations on activity for underlying diseases you may have. SEEK MEDICAL CARE IF:  Your condition is not improving after your transfusion. You develop redness or irritation at the intravenous (IV) site. SEEK IMMEDIATE MEDICAL CARE IF:  Any of the following symptoms occur over the next 12 hours: Shaking chills. You have a temperature by mouth above 102 F (38.9 C), not controlled by medicine. Chest, back, or muscle pain. People around you feel you are not acting correctly or are confused. Shortness of breath or difficulty breathing. Dizziness and fainting. You get a rash or develop hives. You have a decrease in urine output. Your urine turns a dark color or changes to pink, red, or brown. Any of the following symptoms occur over the next 10 days: You have a temperature by mouth above 102 F (38.9 C), not controlled by medicine. Shortness of breath. Weakness after normal activity. The white part of the eye turns yellow (jaundice). You have a decrease in the amount of urine or are urinating less often. Your urine turns a dark color or changes to pink, red, or brown. Document Released: 04/08/2000 Document Revised: 07/04/2011 Document Reviewed: 11/26/2007 [facility] Patient Information 2014 [place], [place].  _______________________________________________________________________

## 2021-01-01 ENCOUNTER — Encounter (HOSPITAL_COMMUNITY)
Admission: RE | Admit: 2021-01-01 | Discharge: 2021-01-01 | Disposition: A | Discharge status: Home / Self Care | Payer: Medicare Other | Source: Ambulatory Visit | Attending: Gynecologic Oncology | Admitting: Gynecologic Oncology

## 2021-01-01 ENCOUNTER — Telehealth: Payer: Self-pay

## 2021-01-01 ENCOUNTER — Other Ambulatory Visit: Payer: Self-pay

## 2021-01-01 ENCOUNTER — Encounter (HOSPITAL_COMMUNITY): Payer: Self-pay

## 2021-01-01 DIAGNOSIS — Z01818 Encounter for other preprocedural examination: Secondary | ICD-10-CM | POA: Diagnosis not present

## 2021-01-01 HISTORY — DX: Unspecified osteoarthritis, unspecified site: M19.90

## 2021-01-01 HISTORY — DX: Cardiac murmur, unspecified: R01.1

## 2021-01-01 HISTORY — DX: Depression, unspecified: F32.A

## 2021-01-01 HISTORY — DX: Myoneural disorder, unspecified: G70.9

## 2021-01-01 LAB — GLUCOSE, CAPILLARY: Glucose-Capillary: 191 mg/dL — ABNORMAL HIGH (ref 70–99)

## 2021-01-01 LAB — URINALYSIS, ROUTINE W REFLEX MICROSCOPIC
Bacteria, UA: NONE SEEN
Bilirubin Urine: NEGATIVE
Glucose, UA: NEGATIVE mg/dL
Hgb urine dipstick: NEGATIVE
Ketones, ur: NEGATIVE mg/dL
Leukocytes,Ua: NEGATIVE
Nitrite: NEGATIVE
Protein, ur: 100 mg/dL — AB
Specific Gravity, Urine: >1.03 — ABNORMAL HIGH (ref 1.005–1.030)
pH: 5.5 (ref 5.0–8.0)

## 2021-01-01 LAB — COMPREHENSIVE METABOLIC PANEL
ALT: 14 U/L (ref 0–44)
AST: 14 U/L — ABNORMAL LOW (ref 15–41)
Albumin: 4.2 g/dL (ref 3.5–5.0)
Alkaline Phosphatase: 64 U/L (ref 38–126)
Anion gap: 8 (ref 5–15)
BUN: 27 mg/dL — ABNORMAL HIGH (ref 8–23)
CO2: 22 mmol/L (ref 22–32)
Calcium: 9.6 mg/dL (ref 8.9–10.3)
Chloride: 110 mmol/L (ref 98–111)
Creatinine, Ser: 0.84 mg/dL (ref 0.44–1.00)
GFR, Estimated: >60 mL/min (ref >60)
Glucose, Bld: 139 mg/dL — ABNORMAL HIGH (ref 70–99)
Potassium: 4.4 mmol/L (ref 3.5–5.1)
Sodium: 140 mmol/L (ref 135–145)
Total Bilirubin: 0.6 mg/dL (ref 0.3–1.2)
Total Protein: 8.3 g/dL — ABNORMAL HIGH (ref 6.5–8.1)

## 2021-01-01 LAB — TYPE AND SCREEN
ABO/RH(D): B POS
Antibody Screen: NEGATIVE

## 2021-01-01 LAB — CBC
HCT: 35.1 % — ABNORMAL LOW (ref 36.0–46.0)
Hemoglobin: 11.8 g/dL — ABNORMAL LOW (ref 12.0–15.0)
MCH: 30.6 pg (ref 26.0–34.0)
MCHC: 33.6 g/dL (ref 30.0–36.0)
MCV: 90.9 fL (ref 80.0–100.0)
Platelets: 529 10*3/uL — ABNORMAL HIGH (ref 150–400)
RBC: 3.86 MIL/uL — ABNORMAL LOW (ref 3.87–5.11)
RDW: 13.6 % (ref 11.5–15.5)
WBC: 13.4 10*3/uL — ABNORMAL HIGH (ref 4.0–10.5)
nRBC: 0 % (ref 0.0–0.2)

## 2021-01-01 NOTE — Telephone Encounter (Signed)
Spoke with Kimberly Hunt regarding her elevated WBC. Patient states she's been feeling fine. Patient denies fever, chills, cough, and dysuria. She reports feeling a little tired and having some heartburn. Patient reports having occasional stomach pain in her low abd that comes and goes. Instructed to call if she begins to feel unwell or has fever or chills. Patient confirmed appointment with Joylene John, NP on Wednesday 01/06/21.

## 2021-01-01 NOTE — Progress Notes (Addendum)
PCP - Dr. Gracy Racer Cardiologist - no  PPM/ICD -  Device Orders -  Rep Notified -   Chest x-ray -  EKG -  Stress Test -  ECHO -  Cardiac Cath -   Sleep Study -  CPAP -   Fasting Blood Sugar - 150 Checks Blood Sugar __2-3___ times a day  Blood Thinner Instructions: Aspirin Instructions:  ERAS Protcol - PRE-SURGERY Ensure or G2-   COVID TEST- Ambulatory COVID vaccine -yes x 3 pfizer   Activity--able to walk a flight of stairs without SOB  Anesthesia review: HTN , mummur in her 30"s no problems told it was normal  Patient denies shortness of breath, fever, cough and chest pain at PAT appointment   All instructions explained to the patient, with a verbal understanding of the material. Patient agrees to go over the instructions while at home for a better understanding. Patient also instructed to self quarantine after being tested for COVID-19. The opportunity to ask questions was provided.

## 2021-01-06 ENCOUNTER — Inpatient Hospital Stay: Payer: Medicare Other | Attending: Gynecologic Oncology

## 2021-01-06 ENCOUNTER — Other Ambulatory Visit: Payer: Self-pay

## 2021-01-06 VITALS — BP 158/68 | HR 89 | Temp 97.0°F | Resp 16 | Ht 64.0 in | Wt 193.6 lb

## 2021-01-06 DIAGNOSIS — C541 Malignant neoplasm of endometrium: Secondary | ICD-10-CM

## 2021-01-06 NOTE — Progress Notes (Signed)
This appointment with instructions were performed by the GYN Onc RN.   Patient here for a pre-operative appointment prior to her scheduled surgery on January 12, 2021. She is scheduled for robotic assisted total laparoscopic hysterectomy, bilateral salpingo-oophorectomy, sentinel lymph node biopsy, possible lymph node dissection. She had her pre-admission testing appointment on 01/01/21 at [facility].  The surgery was discussed in detail.  See after visit summary for additional details. Visual aids used to discuss items related to surgery including sequential compression stockings, foley catheter, IV pump, multi-modal pain regimen including tylenol, photo of the surgical robot, female reproductive system to discuss surgery in detail.      Discussed post-op pain management in detail including the aspects of the enhanced recovery pathway. Advised on the use of tylenol post-op and to monitor for a maximum of 4,000 mg in a 24 hour period.  Also sennakot to be used after surgery and to hold if having loose stools.  Discussed bowel regimen in detail.     Discussed the use of SCDs and measures to take at home to prevent DVT including frequent mobility.  Reportable signs and symptoms of DVT discussed. Post-operative instructions discussed and expectations for after surgery. Incisional care discussed as well including reportable signs and symptoms including erythema, drainage, wound separation.     10 minutes spent with the patient per RN.    This appointment is included in the global surgical bundle as pre-operative teaching and has no charge.

## 2021-01-06 NOTE — Patient Instructions (Signed)
Preparing for your Surgery  Plan for surgery on January 12, 2021 with Dr. Everitt Amber at [facility] will be scheduled for robotic assisted total laparoscopic hysterectomy (removal of the uterus and cervix), bilateral salpingo-oophorectomy (removal of both ovaries and fallopian tubes), sentinel lymph node biopsy, possible lymph node dissection.   Pre-operative Testing - (DONE on 9/9) You will receive a phone call from presurgical testing at [facility] to arrange for a pre-operative appointment and lab work.  -Bring your insurance card, copy of an advanced directive if applicable, medication list  -At that visit, you will be asked to sign a consent for a possible blood transfusion in case a transfusion becomes necessary during surgery.  The need for a blood transfusion is rare but having consent is a necessary part of your care.     -You should not be taking blood thinners or aspirin at least ten days prior to surgery unless instructed by your surgeon.  -Do not take supplements such as fish oil (omega 3), red yeast rice, turmeric before your surgery. You want to avoid medications with aspirin in them including headache powders such as BC or Goody's), Excedrin migraine. DO NOT USE ALKA-SELTZER SINCE THIS CONTAINS ASPIRIN.  Day Before Surgery at [place] will be asked to take in a light diet the day before surgery. You will be advised you can have clear liquids up until 3 hours before your surgery.    Eat a light diet the day before surgery.  Examples including soups, broths, toast, yogurt, mashed potatoes.  AVOID GAS PRODUCING FOODS. Things to avoid include carbonated beverages (fizzy beverages, sodas), raw fruits and raw vegetables (uncooked), or beans.   If your bowels are filled with gas, your surgeon will have difficulty visualizing your pelvic organs which increases your surgical risks.  Your role in recovery Your role is to become active as soon as directed by  your doctor, while still giving yourself time to heal.  Rest when you feel tired. You will be asked to do the following in order to speed your recovery:  - Cough and breathe deeply. This helps to clear and expand your lungs and can prevent pneumonia after surgery.  - [place]. Do mild physical activity. Walking or moving your legs help your circulation and body functions return to normal. Do not try to get up or walk alone the first time after surgery.   -If you develop swelling on one leg or the other, pain in the back of your leg, redness/warmth in one of your legs, please call the office or go to the Emergency Room to have a doppler to rule out a blood clot. For shortness of breath, chest pain-seek care in the Emergency Room as soon as possible. - Actively manage your pain. Managing your pain lets you move in comfort. We will ask you to rate your pain on a scale of zero to 10. It is your responsibility to tell your doctor or nurse where and how much you hurt so your pain can be treated.  Special Considerations -If you are diabetic, you may be placed on insulin after surgery to have closer control over your blood sugars to promote healing and recovery.  This does not mean that you will be discharged on insulin.  If applicable, your oral antidiabetics will be resumed when you are tolerating a solid diet.  -Your final pathology results from surgery should be available around one week after surgery and  the results will be relayed to you when available.  -Dr. Lahoma Crocker is the surgeon that assists your GYN Oncologist with surgery.  If you end up staying the night, the next day after your surgery you will either see Dr. Denman George, Dr. Berline Lopes, or Dr. Lahoma Crocker.  -FMLA forms can be faxed to [phone] and please allow 5-7 business days for completion.  Pain Management After Surgery -You have been prescribed your pain medication and bowel regimen medications before  surgery so that you can have these available when you are discharged from the hospital. The pain medication is for use ONLY AFTER surgery and a new prescription will not be given.   -Make sure that you have Tylenol and Ibuprofen at home to use on a regular basis after surgery for pain control. We recommend alternating the medications every hour to six hours since they work differently and are processed in the body differently for pain relief.  -Review the attached handout on narcotic use and their risks and side effects.   Bowel Regimen -You have been prescribed Sennakot-S to take nightly to prevent constipation especially if you are taking the narcotic pain medication intermittently.  It is important to prevent constipation and drink adequate amounts of liquids. You can stop taking this medication when you are not taking pain medication and you are back on your normal bowel routine.  Risks of Surgery Risks of surgery are low but include bleeding, infection, damage to surrounding structures, re-operation, blood clots, and very rarely death.   Blood Transfusion Information (For the consent to be signed before surgery)  We will be checking your blood type before surgery so in case of emergencies, we will know what type of blood you would need.                                            WHAT IS A BLOOD TRANSFUSION?  A transfusion is the replacement of blood or some of its parts. Blood is made up of multiple cells which provide different functions. Red blood cells carry oxygen and are used for blood loss replacement. White blood cells fight against infection. Platelets control bleeding. Plasma helps clot blood. Other blood products are available for specialized needs, such as hemophilia or other clotting disorders. BEFORE THE TRANSFUSION  Who gives blood for transfusions?  You may be able to donate blood to be used at a later date on yourself (autologous donation). Relatives can be asked to  donate blood. This is generally not any safer than if you have received blood from a stranger. The same precautions are taken to ensure safety when a relative's blood is donated. Healthy volunteers who are fully evaluated to make sure their blood is safe. This is blood bank blood. Transfusion therapy is the safest it has ever been in the practice of medicine. Before blood is taken from a donor, a complete history is taken to make sure that person has no history of diseases nor engages in risky social behavior (examples are intravenous drug use or sexual activity with multiple partners). The donor's travel history is screened to minimize risk of transmitting infections, such as malaria. The donated blood is tested for signs of infectious diseases, such as HIV and hepatitis. The blood is then tested to be sure it is compatible with you in order to minimize the chance of a transfusion reaction. If  you or a relative donates blood, this is often done in anticipation of surgery and is not appropriate for emergency situations. It takes many days to process the donated blood. RISKS AND COMPLICATIONS Although transfusion therapy is very safe and saves many lives, the main dangers of transfusion include:  Getting an infectious disease. Developing a transfusion reaction. This is an allergic reaction to something in the blood you were given. Every precaution is taken to prevent this. The decision to have a blood transfusion has been considered carefully by your caregiver before blood is given. Blood is not given unless the benefits outweigh the risks.  AFTER SURGERY INSTRUCTIONS  Return to work: 4 weeks if applicable  Activity: 1. Be up and out of the bed during the day.  Take a nap if needed.  You may walk up steps but be careful and use the hand rail.  Stair climbing will tire you more than you think, you may need to stop part way and rest.   2. No lifting or straining for 6 weeks over 10 pounds. No pushing,  pulling, straining for 6 weeks.  3. No driving for 1 week(s).  Do not drive if you are taking narcotic pain medicine and make sure that your reaction time has returned.   4. You can shower as soon as the next day after surgery. Shower daily.  Use your regular soap and water (not directly on the incision) and pat your incision(s) dry afterwards; don't rub.  No tub baths or submerging your body in water until cleared by your surgeon. If you have the soap that was given to you by pre-surgical testing that was used before surgery, you do not need to use it afterwards because this can irritate your incisions.   5. No sexual activity and nothing in the vagina for 8 weeks.  6. You may experience a small amount of clear drainage from your incisions, which is normal.  If the drainage persists, increases, or changes color please call the office.  7. Do not use creams, lotions, or ointments such as neosporin on your incisions after surgery until advised by your surgeon because they can cause removal of the dermabond glue on your incisions.    8. You may experience vaginal spotting after surgery or around the 6-8 week mark from surgery when the stitches at the top of the vagina begin to dissolve.  The spotting is normal but if you experience heavy bleeding, call our office.  9. Take Tylenol or ibuprofen first for pain and only use narcotic pain medication for severe pain not relieved by the Tylenol or Ibuprofen.  Monitor your Tylenol intake to a max of 4,000 mg in a 24 hour period. You can alternate these medications after surgery.  Diet: 1. Low sodium Heart Healthy Diet is recommended but you are cleared to resume your normal (before surgery) diet after your procedure.  2. It is safe to use a laxative, such as Miralax or Colace, if you have difficulty moving your bowels. You have been prescribed Sennakot at bedtime every evening to keep bowel movements regular and to prevent constipation.    Wound  Care: 1. Keep clean and dry.  Shower daily.  Reasons to call the Doctor: Fever - Oral temperature greater than 100.4 degrees Fahrenheit Foul-smelling vaginal discharge Difficulty urinating Nausea and vomiting Increased pain at the site of the incision that is unrelieved with pain medicine. Difficulty breathing with or without chest pain New calf pain especially if only on  one side Sudden, continuing increased vaginal bleeding with or without clots.   Contacts: For questions or concerns you should contact:  Dr. Everitt Amber at [phone]  Joylene John, NP at [phone]  After Hours: call [phone] and have the GYN Oncologist paged/contacted (after 5 pm or on the weekends).  Messages sent via mychart are for non-urgent matters and are not responded to after hours so for urgent needs, please call the after hours number.

## 2021-01-11 ENCOUNTER — Telehealth: Payer: Self-pay

## 2021-01-11 NOTE — Anesthesia Preprocedure Evaluation (Addendum)
Anesthesia Evaluation  Patient identified by MRN, date of birth, ID band Patient awake    Reviewed: Allergy & Precautions, NPO status , Patient's Chart, lab work & pertinent test results  Airway Mallampati: II  TM Distance: >3 FB Neck ROM: Full    Dental  (+) Teeth Intact   Pulmonary neg pulmonary ROS, former smoker,    Pulmonary exam normal        Cardiovascular hypertension, Pt. on medications  Rhythm:Regular Rate:Normal     Neuro/Psych Anxiety Depression negative neurological ROS     GI/Hepatic negative GI ROS, Neg liver ROS,   Endo/Other  diabetes, Type 2, Oral Hypoglycemic Agents  Renal/GU negative Renal ROS  Female GU complaint Endometrial cancer     Musculoskeletal  (+) Arthritis , Osteoarthritis,    Abdominal (+)  Abdomen: soft. Bowel sounds: normal.  Peds  Hematology negative hematology ROS (+)   Anesthesia Other Findings   Reproductive/Obstetrics                            Anesthesia Physical Anesthesia Plan  ASA: 2  Anesthesia Plan: General   Post-op Pain Management:    Induction: Intravenous  PONV Risk Score and Plan: 3 and Ondansetron, Dexamethasone and Treatment may vary due to age or medical condition  Airway Management Planned: Mask and Oral ETT  Additional Equipment: None  Intra-op Plan:   Post-operative Plan: Extubation in OR  Informed Consent: I have reviewed the patients History and Physical, chart, labs and discussed the procedure including the risks, benefits and alternatives for the proposed anesthesia with the patient or authorized representative who has indicated his/her understanding and acceptance.     Dental advisory given  Plan Discussed with: CRNA  Anesthesia Plan Comments: (Lab Results      Component                Value               Date                      WBC                      13.4 (H)            01/01/2021                HGB                       11.8 (L)            01/01/2021                HCT                      35.1 (L)            01/01/2021                MCV                      90.9                01/01/2021                PLT                      529 (H)  01/01/2021           Lab Results      Component                Value               Date                      NA                       140                 01/01/2021                K                        4.4                 01/01/2021                CO2                      22                  01/01/2021                GLUCOSE                  139 (H)             01/01/2021                BUN                      27 (H)              01/01/2021                CREATININE               0.84                01/01/2021                CALCIUM                  9.6                 01/01/2021                GFRNONAA                 >60                 01/01/2021                GFRAA                    >60                 12/23/2016          )       Anesthesia Quick Evaluation

## 2021-01-11 NOTE — Telephone Encounter (Signed)
Left message for Kimberly Hunt, her surgery for tomorrow has been moved up. She needs to arrive to the hospital by 0515 tomorrow morning. Requested a return call.

## 2021-01-12 ENCOUNTER — Ambulatory Visit (HOSPITAL_COMMUNITY): Payer: Medicare Other | Admitting: Anesthesiology

## 2021-01-12 ENCOUNTER — Encounter (HOSPITAL_COMMUNITY)
Admission: RE | Disposition: A | Discharge status: Home / Self Care | Payer: Self-pay | Source: Home / Self Care | Attending: Gynecologic Oncology

## 2021-01-12 ENCOUNTER — Ambulatory Visit (HOSPITAL_COMMUNITY)
Admission: RE | Admit: 2021-01-12 | Discharge: 2021-01-12 | Disposition: A | Discharge status: Home / Self Care | Payer: Medicare Other | Attending: Gynecologic Oncology | Admitting: Gynecologic Oncology

## 2021-01-12 ENCOUNTER — Other Ambulatory Visit: Payer: Self-pay

## 2021-01-12 ENCOUNTER — Encounter (HOSPITAL_COMMUNITY): Payer: Self-pay | Admitting: Gynecologic Oncology

## 2021-01-12 ENCOUNTER — Ambulatory Visit (HOSPITAL_COMMUNITY): Payer: Medicare Other | Admitting: Emergency Medicine

## 2021-01-12 ENCOUNTER — Other Ambulatory Visit: Payer: Self-pay | Admitting: Gynecologic Oncology

## 2021-01-12 DIAGNOSIS — I10 Essential (primary) hypertension: Secondary | ICD-10-CM | POA: Insufficient documentation

## 2021-01-12 DIAGNOSIS — Z9111 Patient's noncompliance with dietary regimen: Secondary | ICD-10-CM | POA: Diagnosis not present

## 2021-01-12 DIAGNOSIS — T465X6A Underdosing of other antihypertensive drugs, initial encounter: Secondary | ICD-10-CM | POA: Diagnosis not present

## 2021-01-12 DIAGNOSIS — N800 Endometriosis of uterus: Secondary | ICD-10-CM | POA: Diagnosis not present

## 2021-01-12 DIAGNOSIS — N888 Other specified noninflammatory disorders of cervix uteri: Secondary | ICD-10-CM | POA: Insufficient documentation

## 2021-01-12 DIAGNOSIS — Z79899 Other long term (current) drug therapy: Secondary | ICD-10-CM | POA: Diagnosis not present

## 2021-01-12 DIAGNOSIS — Z7984 Long term (current) use of oral hypoglycemic drugs: Secondary | ICD-10-CM | POA: Diagnosis not present

## 2021-01-12 DIAGNOSIS — Z794 Long term (current) use of insulin: Secondary | ICD-10-CM | POA: Insufficient documentation

## 2021-01-12 DIAGNOSIS — E1165 Type 2 diabetes mellitus with hyperglycemia: Secondary | ICD-10-CM | POA: Diagnosis not present

## 2021-01-12 DIAGNOSIS — F418 Other specified anxiety disorders: Secondary | ICD-10-CM | POA: Diagnosis not present

## 2021-01-12 DIAGNOSIS — Z87891 Personal history of nicotine dependence: Secondary | ICD-10-CM | POA: Diagnosis not present

## 2021-01-12 DIAGNOSIS — Z9049 Acquired absence of other specified parts of digestive tract: Secondary | ICD-10-CM | POA: Insufficient documentation

## 2021-01-12 DIAGNOSIS — E1140 Type 2 diabetes mellitus with diabetic neuropathy, unspecified: Secondary | ICD-10-CM | POA: Diagnosis not present

## 2021-01-12 DIAGNOSIS — N838 Other noninflammatory disorders of ovary, fallopian tube and broad ligament: Secondary | ICD-10-CM | POA: Diagnosis not present

## 2021-01-12 DIAGNOSIS — C541 Malignant neoplasm of endometrium: Secondary | ICD-10-CM

## 2021-01-12 DIAGNOSIS — Z6833 Body mass index (BMI) 33.0-33.9, adult: Secondary | ICD-10-CM | POA: Insufficient documentation

## 2021-01-12 DIAGNOSIS — Z91128 Patient's intentional underdosing of medication regimen for other reason: Secondary | ICD-10-CM | POA: Insufficient documentation

## 2021-01-12 DIAGNOSIS — D360 Benign neoplasm of lymph nodes: Secondary | ICD-10-CM | POA: Insufficient documentation

## 2021-01-12 DIAGNOSIS — E669 Obesity, unspecified: Secondary | ICD-10-CM | POA: Insufficient documentation

## 2021-01-12 DIAGNOSIS — Z803 Family history of malignant neoplasm of breast: Secondary | ICD-10-CM | POA: Insufficient documentation

## 2021-01-12 DIAGNOSIS — D251 Intramural leiomyoma of uterus: Secondary | ICD-10-CM | POA: Diagnosis not present

## 2021-01-12 HISTORY — PX: ROBOTIC ASSISTED TOTAL HYSTERECTOMY WITH BILATERAL SALPINGO OOPHERECTOMY: SHX6086

## 2021-01-12 HISTORY — PX: SENTINEL NODE BIOPSY: SHX6608

## 2021-01-12 LAB — ABO/RH: ABO/RH(D): B POS

## 2021-01-12 LAB — GLUCOSE, CAPILLARY
Glucose-Capillary: 138 mg/dL — ABNORMAL HIGH (ref 70–99)
Glucose-Capillary: 165 mg/dL — ABNORMAL HIGH (ref 70–99)

## 2021-01-12 SURGERY — HYSTERECTOMY, TOTAL, ROBOT-ASSISTED, LAPAROSCOPIC, WITH BILATERAL SALPINGO-OOPHORECTOMY
Anesthesia: General

## 2021-01-12 MED ORDER — OXYCODONE HCL 5 MG PO TABS
ORAL_TABLET | ORAL | Status: AC
  Administered 2021-01-12: 5 mg via ORAL
  Filled 2021-01-12: qty 1

## 2021-01-12 MED ORDER — STERILE WATER FOR INJECTION IJ SOLN
INTRAMUSCULAR | Status: DC | PRN
  Administered 2021-01-12: 10 mL via INTRAVENOUS

## 2021-01-12 MED ORDER — STERILE WATER FOR IRRIGATION IR SOLN
Status: DC | PRN
  Administered 2021-01-12: 1000 mL

## 2021-01-12 MED ORDER — LIDOCAINE HCL (PF) 2 % IJ SOLN
INTRAMUSCULAR | Status: AC
  Filled 2021-01-12: qty 5

## 2021-01-12 MED ORDER — ROCURONIUM BROMIDE 100 MG/10ML IV SOLN
INTRAVENOUS | Status: DC | PRN
  Administered 2021-01-12: 50 mg via INTRAVENOUS
  Administered 2021-01-12: 20 mg via INTRAVENOUS

## 2021-01-12 MED ORDER — OXYCODONE HCL 5 MG PO TABS: 5.0000 mg | ORAL_TABLET | ORAL | 0 refills | Status: DC | PRN

## 2021-01-12 MED ORDER — BUPIVACAINE HCL 0.25 % IJ SOLN
INTRAMUSCULAR | Status: DC | PRN
  Administered 2021-01-12: 18 mL

## 2021-01-12 MED ORDER — ONDANSETRON HCL 4 MG/2ML IJ SOLN
INTRAMUSCULAR | Status: AC
  Filled 2021-01-12: qty 2

## 2021-01-12 MED ORDER — ONDANSETRON HCL 4 MG/2ML IJ SOLN
INTRAMUSCULAR | Status: DC | PRN
  Administered 2021-01-12: 4 mg via INTRAVENOUS

## 2021-01-12 MED ORDER — PHENYLEPHRINE 40 MCG/ML (10ML) SYRINGE FOR IV PUSH (FOR BLOOD PRESSURE SUPPORT)
PREFILLED_SYRINGE | INTRAVENOUS | Status: DC | PRN
  Administered 2021-01-12 (×4): 80 ug via INTRAVENOUS
  Administered 2021-01-12: 40 ug via INTRAVENOUS
  Administered 2021-01-12 (×2): 80 ug via INTRAVENOUS

## 2021-01-12 MED ORDER — MIDAZOLAM HCL 5 MG/5ML IJ SOLN
INTRAMUSCULAR | Status: DC | PRN
Start: 2021-01-12 — End: 2021-01-12
  Administered 2021-01-12: 2 mg via INTRAVENOUS

## 2021-01-12 MED ORDER — BUPIVACAINE HCL 0.25 % IJ SOLN
INTRAMUSCULAR | Status: AC
  Filled 2021-01-12: qty 1

## 2021-01-12 MED ORDER — FENTANYL CITRATE (PF) 250 MCG/5ML IJ SOLN
INTRAMUSCULAR | Status: AC
  Filled 2021-01-12: qty 5

## 2021-01-12 MED ORDER — STERILE WATER FOR INJECTION IJ SOLN
INTRAMUSCULAR | Status: AC
  Filled 2021-01-12: qty 10

## 2021-01-12 MED ORDER — FENTANYL CITRATE PF 50 MCG/ML IJ SOSY
25.0000 ug | PREFILLED_SYRINGE | INTRAMUSCULAR | Status: DC | PRN
  Administered 2021-01-12 (×2): 50 ug via INTRAVENOUS

## 2021-01-12 MED ORDER — ENOXAPARIN SODIUM 40 MG/0.4ML IJ SOSY
40.0000 mg | PREFILLED_SYRINGE | INTRAMUSCULAR | Status: AC
  Administered 2021-01-12: 40 mg via SUBCUTANEOUS
  Filled 2021-01-12: qty 0.4

## 2021-01-12 MED ORDER — LACTATED RINGERS IV SOLN: INTRAVENOUS | Status: DC

## 2021-01-12 MED ORDER — AMISULPRIDE (ANTIEMETIC) 5 MG/2ML IV SOLN: 10.0000 mg | Freq: Once | INTRAVENOUS | Status: DC | PRN

## 2021-01-12 MED ORDER — FENTANYL CITRATE PF 50 MCG/ML IJ SOSY
PREFILLED_SYRINGE | INTRAMUSCULAR | Status: AC
  Administered 2021-01-12: 50 ug via INTRAVENOUS
  Filled 2021-01-12: qty 3

## 2021-01-12 MED ORDER — ROCURONIUM BROMIDE 10 MG/ML (PF) SYRINGE
PREFILLED_SYRINGE | INTRAVENOUS | Status: AC
  Filled 2021-01-12: qty 10

## 2021-01-12 MED ORDER — OXYCODONE HCL 5 MG/5ML PO SOLN: 5.0000 mg | Freq: Once | ORAL | Status: AC | PRN

## 2021-01-12 MED ORDER — MIDAZOLAM HCL 2 MG/2ML IJ SOLN
INTRAMUSCULAR | Status: AC
  Filled 2021-01-12: qty 2

## 2021-01-12 MED ORDER — SUGAMMADEX SODIUM 200 MG/2ML IV SOLN
INTRAVENOUS | Status: DC | PRN
Start: 2021-01-12 — End: 2021-01-12
  Administered 2021-01-12: 200 mg via INTRAVENOUS

## 2021-01-12 MED ORDER — LACTATED RINGERS IR SOLN
Status: DC | PRN
  Administered 2021-01-12: 1000 mL

## 2021-01-12 MED ORDER — ORAL CARE MOUTH RINSE: 15.0000 mL | Freq: Once | OROMUCOSAL | Status: AC

## 2021-01-12 MED ORDER — PROPOFOL 10 MG/ML IV BOLUS
INTRAVENOUS | Status: DC | PRN
  Administered 2021-01-12: 150 mg via INTRAVENOUS

## 2021-01-12 MED ORDER — CEFAZOLIN SODIUM-DEXTROSE 2-4 GM/100ML-% IV SOLN
2.0000 g | INTRAVENOUS | Status: AC
  Administered 2021-01-12: 2 g via INTRAVENOUS
  Filled 2021-01-12: qty 100

## 2021-01-12 MED ORDER — STERILE WATER FOR INJECTION IJ SOLN
INTRAMUSCULAR | Status: DC | PRN
  Administered 2021-01-12: 10 mL

## 2021-01-12 MED ORDER — LIDOCAINE 2% (20 MG/ML) 5 ML SYRINGE
INTRAMUSCULAR | Status: DC | PRN
  Administered 2021-01-12: 60 mg via INTRAVENOUS

## 2021-01-12 MED ORDER — IBUPROFEN 800 MG PO TABS: 800.0000 mg | ORAL_TABLET | Freq: Three times a day (TID) | ORAL | 0 refills | Status: DC | PRN

## 2021-01-12 MED ORDER — CHLORHEXIDINE GLUCONATE 0.12 % MT SOLN
15.0000 mL | Freq: Once | OROMUCOSAL | Status: AC
  Administered 2021-01-12: 15 mL via OROMUCOSAL

## 2021-01-12 MED ORDER — ACETAMINOPHEN 500 MG PO TABS
1000.0000 mg | ORAL_TABLET | ORAL | Status: AC
  Administered 2021-01-12: 1000 mg via ORAL
  Filled 2021-01-12: qty 2

## 2021-01-12 MED ORDER — DEXAMETHASONE SODIUM PHOSPHATE 10 MG/ML IJ SOLN
INTRAMUSCULAR | Status: AC
  Filled 2021-01-12: qty 1

## 2021-01-12 MED ORDER — ACETAMINOPHEN 500 MG PO TABS: 500.0000 mg | ORAL_TABLET | Freq: Four times a day (QID) | ORAL | 0 refills | Status: AC | PRN

## 2021-01-12 MED ORDER — OXYCODONE HCL 5 MG PO TABS: 5.0000 mg | ORAL_TABLET | Freq: Once | ORAL | Status: AC | PRN

## 2021-01-12 MED ORDER — FENTANYL CITRATE (PF) 100 MCG/2ML IJ SOLN
INTRAMUSCULAR | Status: DC | PRN
  Administered 2021-01-12: 50 ug via INTRAVENOUS
  Administered 2021-01-12 (×2): 25 ug via INTRAVENOUS
  Administered 2021-01-12: 100 ug via INTRAVENOUS
  Administered 2021-01-12: 50 ug via INTRAVENOUS

## 2021-01-12 MED ORDER — SODIUM CHLORIDE 0.9% FLUSH: 3.0000 mL | Freq: Two times a day (BID) | INTRAVENOUS | Status: DC

## 2021-01-12 MED ORDER — DEXAMETHASONE SODIUM PHOSPHATE 4 MG/ML IJ SOLN
INTRAMUSCULAR | Status: DC | PRN
  Administered 2021-01-12: 4 mg via INTRAVENOUS

## 2021-01-12 SURGICAL SUPPLY — 81 items
ADH SKN CLS APL DERMABOND .7 (GAUZE/BANDAGES/DRESSINGS) ×1
AGENT HMST KT MTR STRL THRMB (HEMOSTASIS)
APL ESCP 34 STRL LF DISP (HEMOSTASIS)
APPLICATOR SURGIFLO ENDO (HEMOSTASIS) IMPLANT
BAG COUNTER SPONGE SURGICOUNT (BAG) IMPLANT
BAG LAPAROSCOPIC 12 15 PORT 16 (BASKET) IMPLANT
BAG RETRIEVAL 12/15 (BASKET)
BAG SPEC RTRVL LRG 6X4 10 (ENDOMECHANICALS) ×1
BAG SPNG CNTER NS LX DISP (BAG)
BLADE SURG SZ10 CARB STEEL (BLADE) IMPLANT
CELLS DAT CNTRL 66122 CELL SVR (MISCELLANEOUS) IMPLANT
COVER BACK TABLE 60X90IN (DRAPES) ×2 IMPLANT
COVER TIP SHEARS 8 DVNC (MISCELLANEOUS) ×1 IMPLANT
COVER TIP SHEARS 8MM DA VINCI (MISCELLANEOUS) ×2
DECANTER SPIKE VIAL GLASS SM (MISCELLANEOUS) ×1 IMPLANT
DERMABOND ADVANCED (GAUZE/BANDAGES/DRESSINGS) ×1
DERMABOND ADVANCED .7 DNX12 (GAUZE/BANDAGES/DRESSINGS) ×1 IMPLANT
DRAPE ARM DVNC X/XI (DISPOSABLE) ×4 IMPLANT
DRAPE COLUMN DVNC XI (DISPOSABLE) ×1 IMPLANT
DRAPE DA VINCI XI ARM (DISPOSABLE) ×8
DRAPE DA VINCI XI COLUMN (DISPOSABLE) ×2
DRAPE SHEET LG 3/4 BI-LAMINATE (DRAPES) ×2 IMPLANT
DRAPE SURG IRRIG POUCH 19X23 (DRAPES) ×2 IMPLANT
DRSG OPSITE POSTOP 4X6 (GAUZE/BANDAGES/DRESSINGS) IMPLANT
DRSG OPSITE POSTOP 4X8 (GAUZE/BANDAGES/DRESSINGS) IMPLANT
ELECT PENCIL ROCKER SW 15FT (MISCELLANEOUS) IMPLANT
ELECT REM PT RETURN 15FT ADLT (MISCELLANEOUS) ×2 IMPLANT
GAUZE 4X4 16PLY ~~LOC~~+RFID DBL (SPONGE) ×2 IMPLANT
GLOVE SURG ENC MOIS LTX SZ6 (GLOVE) ×8 IMPLANT
GLOVE SURG ENC MOIS LTX SZ6.5 (GLOVE) ×4 IMPLANT
GOWN STRL REUS W/ TWL LRG LVL3 (GOWN DISPOSABLE) ×4 IMPLANT
GOWN STRL REUS W/TWL LRG LVL3 (GOWN DISPOSABLE) ×8
HOLDER FOLEY CATH W/STRAP (MISCELLANEOUS) IMPLANT
IRRIG SUCT STRYKERFLOW 2 WTIP (MISCELLANEOUS) ×2
IRRIGATION SUCT STRKRFLW 2 WTP (MISCELLANEOUS) ×1 IMPLANT
KIT PROCEDURE DA VINCI SI (MISCELLANEOUS)
KIT PROCEDURE DVNC SI (MISCELLANEOUS) IMPLANT
KIT TURNOVER KIT A (KITS) ×2 IMPLANT
MANIPULATOR UTERINE 4.5 ZUMI (MISCELLANEOUS) ×2 IMPLANT
NDL SPNL 18GX3.5 QUINCKE PK (NEEDLE) IMPLANT
NEEDLE HYPO 22GX1.5 SAFETY (NEEDLE) ×2 IMPLANT
NEEDLE SPNL 18GX3.5 QUINCKE PK (NEEDLE) ×2 IMPLANT
OBTURATOR OPTICAL STANDARD 8MM (TROCAR) ×2
OBTURATOR OPTICAL STND 8 DVNC (TROCAR) ×1
OBTURATOR OPTICALSTD 8 DVNC (TROCAR) ×1 IMPLANT
PACK ROBOT GYN CUSTOM WL (TRAY / TRAY PROCEDURE) ×2 IMPLANT
PAD POSITIONING PINK XL (MISCELLANEOUS) ×2 IMPLANT
PORT ACCESS TROCAR AIRSEAL 12 (TROCAR) ×1 IMPLANT
PORT ACCESS TROCAR AIRSEAL 5M (TROCAR) ×1
POUCH SPECIMEN RETRIEVAL 10MM (ENDOMECHANICALS) ×1 IMPLANT
RETRACTOR WND ALEXIS 18 MED (MISCELLANEOUS) IMPLANT
RETRACTOR WND ALEXIS 25 LRG (MISCELLANEOUS) IMPLANT
RTRCTR WOUND ALEXIS 18CM MED (MISCELLANEOUS)
RTRCTR WOUND ALEXIS 25CM LRG (MISCELLANEOUS)
SCRUB EXIDINE 4% CHG 4OZ (MISCELLANEOUS) ×2 IMPLANT
SEAL CANN UNIV 5-8 DVNC XI (MISCELLANEOUS) ×3 IMPLANT
SEAL XI 5MM-8MM UNIVERSAL (MISCELLANEOUS) ×6
SET TRI-LUMEN FLTR TB AIRSEAL (TUBING) ×2 IMPLANT
SPONGE T-LAP 18X18 ~~LOC~~+RFID (SPONGE) IMPLANT
SURGIFLO W/THROMBIN 8M KIT (HEMOSTASIS) IMPLANT
SUT MNCRL AB 4-0 PS2 18 (SUTURE) IMPLANT
SUT PDS AB 1 TP1 96 (SUTURE) IMPLANT
SUT VIC AB 0 CT1 27 (SUTURE) ×2
SUT VIC AB 0 CT1 27XBRD ANTBC (SUTURE) ×1 IMPLANT
SUT VIC AB 2-0 CT1 27 (SUTURE)
SUT VIC AB 2-0 CT1 TAPERPNT 27 (SUTURE) IMPLANT
SUT VIC AB 4-0 PS2 18 (SUTURE) ×4 IMPLANT
SUT VLOC 180 0 9IN  GS21 (SUTURE) ×2
SUT VLOC 180 0 9IN GS21 (SUTURE) ×1 IMPLANT
SYR 10ML LL (SYRINGE) ×1 IMPLANT
SYR 20ML LL LF (SYRINGE) IMPLANT
SYR 50ML LL SCALE MARK (SYRINGE) IMPLANT
TOWEL OR NON WOVEN STRL DISP B (DISPOSABLE) ×2 IMPLANT
TRAP SPECIMEN MUCUS 40CC (MISCELLANEOUS) IMPLANT
TRAY FOL W/BAG SLVR 16FR STRL (SET/KITS/TRAYS/PACK) IMPLANT
TRAY FOLEY MTR SLVR 16FR STAT (SET/KITS/TRAYS/PACK) ×1 IMPLANT
TRAY FOLEY W/BAG SLVR 16FR LF (SET/KITS/TRAYS/PACK) ×2
TROCAR XCEL NON-BLD 5MMX100MML (ENDOMECHANICALS) IMPLANT
UNDERPAD 30X36 HEAVY ABSORB (UNDERPADS AND DIAPERS) ×2 IMPLANT
WATER STERILE IRR 1000ML POUR (IV SOLUTION) ×2 IMPLANT
YANKAUER SUCT BULB TIP 10FT TU (MISCELLANEOUS) IMPLANT

## 2021-01-12 NOTE — Anesthesia Procedure Notes (Signed)
Procedure Name: Intubation Date/Time: 01/12/2021 7:38 AM Performed by: Lieutenant Diego, CRNA Pre-anesthesia Checklist: Patient identified, Emergency Drugs available, Suction available and Patient being monitored Patient Re-evaluated:Patient Re-evaluated prior to induction Oxygen Delivery Method: Circle system utilized Preoxygenation: Pre-oxygenation with 100% oxygen Induction Type: IV induction Ventilation: Mask ventilation without difficulty Laryngoscope Size: Miller and 2 Grade View: Grade I Tube type: Oral Tube size: 7.0 mm Number of attempts: 1 Airway Equipment and Method: Stylet Placement Confirmation: ETT inserted through vocal cords under direct vision, positive ETCO2 and breath sounds checked- equal and bilateral Secured at: 23 cm Tube secured with: Tape Dental Injury: Teeth and Oropharynx as per pre-operative assessment

## 2021-01-12 NOTE — Progress Notes (Signed)
Called patient's sisters Georg Ruddle and [place]. Received voice mail on both. Informed them that surgery went well and no complications. Anticipate discharge later today.  Thereasa Solo, MD

## 2021-01-12 NOTE — Anesthesia Postprocedure Evaluation (Signed)
Anesthesia Post Note  Patient: Kimberly Hunt  Procedure(s) Performed: XI ROBOTIC ASSISTED TOTAL HYSTERECTOMY WITH BILATERAL SALPINGO OOPHORECTOMY SENTINEL LYMPH NODE BIOPSY     Patient location during evaluation: PACU Anesthesia Type: General Level of consciousness: awake and alert Pain management: pain level controlled Vital Signs Assessment: post-procedure vital signs reviewed and stable Respiratory status: spontaneous breathing, nonlabored ventilation, respiratory function stable and patient connected to nasal cannula oxygen Cardiovascular status: blood pressure returned to baseline and stable Postop Assessment: no apparent nausea or vomiting Anesthetic complications: no   No notable events documented.  Last Vitals:  Vitals:   01/12/21 1042 01/12/21 1045  BP: (!) 171/87 (!) 160/76  Pulse: 88 88  Resp: 12   Temp:    SpO2: 96% 96%    Last Pain:  Vitals:   01/12/21 1049  TempSrc:   PainSc: 3                  Vernette Moise P Tahtiana Rozier

## 2021-01-12 NOTE — Transfer of Care (Signed)
Immediate Anesthesia Transfer of Care Note  Patient: Kimberly Hunt  Procedure(s) Performed: XI ROBOTIC ASSISTED TOTAL HYSTERECTOMY WITH BILATERAL SALPINGO OOPHORECTOMY SENTINEL LYMPH NODE BIOPSY  Patient Location: PACU  Anesthesia Type:General  Level of Consciousness: awake and alert   Airway & Oxygen Therapy: Patient Spontanous Breathing and Patient connected to face mask oxygen  Post-op Assessment: Report given to RN and Post -op Vital signs reviewed and stable  Post vital signs: Reviewed and stable  Last Vitals:  Vitals Value Taken Time  BP 173/87 01/12/21 0933  Temp 36.7 C 01/12/21 0934  Pulse 84 01/12/21 0934  Resp 14 01/12/21 0934  SpO2 100 % 01/12/21 0934  Vitals shown include unvalidated device data.  Last Pain:  Vitals:   01/12/21 0615  TempSrc: Oral         Complications: No notable events documented.

## 2021-01-12 NOTE — Progress Notes (Signed)
Post op pain meds

## 2021-01-12 NOTE — Discharge Instructions (Signed)
01/12/2021  Return to work: 4 weeks  Activity: 1. Be up and out of the bed during the day.  Take a nap if needed.  You may walk up steps but be careful and use the hand rail.  Stair climbing will tire you more than you think, you may need to stop part way and rest.   2. No lifting or straining for 6 weeks.  3. No driving for 1 week.  Do Not drive if you are taking narcotic pain medicine.  4. Shower daily.  Use soap and water on your incision and pat dry; don't rub.   5. No sexual activity and nothing in the vagina for 8 weeks.  Medications:  - Take ibuprofen and tylenol first line for pain control. Take these regularly (every 6 hours) to decrease the build up of pain.  - If necessary, for severe pain not relieved by ibuprofen, take oxycodone.  - While taking oxycodone you should take sennakot every night to reduce the likelihood of constipation. If this causes diarrhea, stop its use.  Diet: 1. Low sodium Heart Healthy Diet is recommended.  2. It is safe to use a laxative if you have difficulty moving your bowels.   Wound Care: 1. Keep clean and dry.  Shower daily.  Reasons to call the Doctor:  Fever - Oral temperature greater than 100.4 degrees Fahrenheit Foul-smelling vaginal discharge Difficulty urinating Nausea and vomiting Increased pain at the site of the incision that is unrelieved with pain medicine. Difficulty breathing with or without chest pain New calf pain especially if only on one side Sudden, continuing increased vaginal bleeding with or without clots.   Follow-up: 1. See one of Dr Serita Grit partners in 3 weeks.  Contacts: For questions or concerns you should contact:  Dr. Everitt Amber at [phone] After hours and on week-ends call [phone] and ask to speak to the physician on call for Gynecologic Oncology

## 2021-01-12 NOTE — Op Note (Signed)
OPERATIVE NOTE 01/12/21  Surgeon: Donaciano Eva   Assistants: Dr Lahoma Crocker (an MD assistant was necessary for tissue manipulation, management of robotic instrumentation, retraction and positioning due to the complexity of the case and hospital policies).   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         Disposition: PACU - hemodynamically stable.  Procedure Details  The patient was seen in the Holding Room. The risks, benefits, complications, treatment options, and expected outcomes were discussed with the patient.  The patient concurred with the proposed plan, giving informed consent.  The site of surgery properly noted/marked. The patient was identified as Kimberly Hunt and the procedure verified as a Robotic-assisted hysterectomy with bilateral salpingo oophorectomy with SLN biopsy. A Time Out was held and the above information confirmed.  After induction of anesthesia, the patient was draped and prepped in the usual sterile manner. Pt was placed in supine position after anesthesia and draped and prepped in the usual sterile manner. The abdominal drape was placed after the CholoraPrep had been allowed to dry for 3 minutes.  Her arms were tucked to her side with all appropriate precautions.  The shoulders were stabilized with padded shoulder  blocks applied to the acromium processes.  The patient was placed in the semi-lithotomy position in [place].  The perineum was prepped with Betadine. The patient was then prepped. Foley catheter was placed.  A sterile speculum was placed in the vagina.  The cervix was grasped with a single-tooth tenaculum. 2mg  total of ICG was injected into the cervical stroma at 2 and 9 o'clock with 1cc injected at a 1cm and 4mm depth (concentration 0.5mg /ml) in all locations. The cervix was dilated with Kennon Rounds dilators.  The ZUMI uterine manipulator with a medium colpotomizer ring was placed without difficulty.  A pneum occluder balloon was placed over the manipulator.  OG tube placement was confirmed and to suction.   Next, a 5 mm skin incision was made 1 cm below the subcostal margin in the midclavicular line.  The 5 mm Optiview port and scope was used for direct entry.  Opening pressure was under 10 mm CO2.  The abdomen was insufflated and the findings were noted as above.   At this point and all points during the procedure, the patient's intra-abdominal pressure did not exceed 15 mmHg. Next, a 10 mm skin incision was made in the umbilicus and a right and left port was placed about 10 cm lateral to the robot port on the right and left side.  A fourth arm was placed in the left lower quadrant 2 cm above and superior and medial to the anterior superior iliac spine.  All ports were placed under direct visualization.  The patient was placed in steep Trendelenburg.  Bowel was folded away into the upper abdomen.  The robot was docked  in the normal manner.  The right and left peritoneum were opened parallel to the IP ligament to open the retroperitoneal spaces bilaterally. The SLN mapping was performed in bilateral pelvic basins. The para rectal and paravesical spaces were opened up entirely with careful dissection below the level of the ureters bilaterally and to the depth of the uterine artery origin in order to  skeletonize the uterine "web" and ensure visualization of all parametrial channels. The para-aortic basins were carefully exposed and evaluated for isolated para-aortic SLN's. Lymphatic channels were identified travelling to the following visualized sentinel lymph node's: right and left obturator SLN. These SLN's were separated from their surrounding lymphatic tissue, removed and sent for permanent pathology.  The hysterectomy was started after the round ligament on the right side was incised and the retroperitoneum was entered and the pararectal space was developed.  The ureter was noted to be on the medial leaf of the broad ligament.  The peritoneum above the ureter was incised and stretched and the infundibulopelvic ligament was skeletonized, cauterized and cut.  The posterior peritoneum was taken down to the level of the KOH ring.  The anterior peritoneum was also taken down.  The bladder flap was created to the level of the KOH ring.  The uterine artery on the right side was skeletonized, cauterized and cut in the normal manner.  A similar procedure was performed on the left.  The colpotomy was made and the uterus, cervix, bilateral ovaries and tubes were amputated and delivered through the vagina.  Pedicles were inspected and excellent hemostasis was achieved.    The colpotomy at the vaginal cuff was closed with Vicryl on a CT1 needle in figure of 8's at the corners and running v-lock suture. Irrigation was used and excellent hemostasis was achieved.  At this point in the procedure was completed.  Robotic instruments were removed under direct visulaization.  The robot was undocked. The 10 mm ports were closed with Vicryl on a UR-5 needle and the fascia was closed with 0 Vicryl on a UR-5 needle.  The skin was closed with 4-0 Vicryl in a subcuticular manner.  Dermabond was applied.  Sponge, lap and needle counts correct x 2.  The patient was taken to the recovery room in stable condition.  The vagina was  swabbed with  minimal bleeding noted.   All instrument and needle counts were correct x  3.   The patient was transferred to the recovery room in a stable condition.  Donaciano Eva, MD

## 2021-01-12 NOTE — H&P (Signed)
H&P Note: Gyn-Onc  Consult was requested by Dr. Radene Knee for the evaluation of Kimberly Hunt 65 y.o. female  CC:  Endometrial cancer   Assessment/Plan:  Kimberly Hunt  is a 65 y.o.  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  I explained that her very poor blood pressure and blood glucose control placing her at substantially higher risk for complications that are preventable such as stroke, heart attack, surgical site infection, and healing abnormalities. However, her surgery has already be delayed for optimization and she is doing better than previously.   We will perform robotic assisted total hysterectomy with BSO and sentinel lymph node biopsy.  HPI: Ms Kimberly Hunt is a 66 year old P0 who was seen in consultation at the request of Dr Radene Knee for evaluation and treatment of grade 1-2 endometrial cancer.   Her symptoms began in 2019 with postmenopausal bleeding that was initially light.  When it became heavier in 2022 she contacted her gynecologist with whom she has regular visits, Dr. Radene Knee, and reported her bleeding symptoms.  She saw Dr Radene Knee for a problem visit on 07/13/2020.  Work-up of symptoms included ultrasound scan and hysteroscopy with D&C. Transvaginal US on 07/13/2020 showed a uterus measuring 7.35 x 3.24 x 4.9 cm with no adnexal masses.  There was small intramural fibroid seen.  There is a 9 mm polyp was seen in the endometrial cavity and a thickening in the fundal portion of the endometrium with a 9.6 mm endometrium. Endometrial sampling with hysteroscopy D&C in the operating room was performed on 09/02/2020 and showed endometrioid adenocarcinoma with focal squamous differentiation, FIGO grade 1-2.   The patient is obese with a BMI of 30 kg per metered squared.  He has very poorly controlled type 2 diabetes mellitus for which she takes long-term insulin and has associated complications of  neuropathy and retinopathy.  Her last reported hemoglobin A1c was 13.2% on May 12, 2020 and this was following prescription of additional NovoLog insulin.  Her primary care physician, Dr. Shirline Frees, manages her diabetes.  She reported that she had not had a change in her insulin regimen since May 12, 2020 hemoglobin A1c was 13.2%.  Additionally she has poorly controlled hypertensive and admits to intermittently taking her antihypertensive medication.  Her surgical history is most remarkable for a laparoscopic cholecystectomy.  She had never been pregnant.  Interval Hx:  Her HbA1 C at the time of diagnosis on 09/11/20 was 11.2% and considered reflective of very poor BS control. She was treated with oral progestins and hysterectomy was delayed in order to facilitate optimization of her blood pressure and blood glucose.  She reported that her PCP increased her dosage of insulin and added metformin.  She reported having somewhat improved blood glucose measures since that time, but admits to flares of hyperglycemia following poor diet choices.  Repeat Hb H1C on 11/24/20 was 9.8%.   Current Meds:  Outpatient Encounter Medications as of 11/24/2020  Medication Sig   ALPRAZolam (XANAX) 0.25 MG tablet Take 0.25 mg by mouth daily as needed for anxiety.  (Patient not taking: Reported on 09/10/2020)   amLODipine (NORVASC) 10 MG tablet Take 1 tablet by mouth daily.   AZO-CRANBERRY PO Take 2 tablets by mouth daily as needed (for urinary). (Patient not taking: Reported on 09/10/2020)   blood glucose meter kit and supplies KIT Dispense based on patient and insurance preference. Use up to four  times daily as directed. (FOR ICD-9 250.00, 250.01).   cephALEXin (KEFLEX) 250 MG capsule Take 1 capsule (250 mg total) by mouth 4 (four) times daily. (Patient not taking: Reported on 09/10/2020)   citalopram (CELEXA) 20 MG tablet Take 20 mg by mouth at bedtime. (Patient not taking: Reported on 09/10/2020)    citalopram (CELEXA) 40 MG tablet Take 1 tablet by mouth every evening.   gabapentin (NEURONTIN) 300 MG capsule Take 1 capsule by mouth at bedtime.   HYDROcodone-acetaminophen (NORCO) 7.5-325 MG tablet Take 1 tablet by mouth 2 (two) times daily as needed for moderate pain.    insulin aspart (NOVOLOG FLEXPEN) 100 UNIT/ML FlexPen as directed up to 10 units   Insulin Glargine (LANTUS) 100 UNIT/ML Solostar Pen Inject 30 Units into the skin daily. Starting 06/05/14 at [facility]. (Patient not taking: No sig reported)   irbesartan-hydrochlorothiazide (AVALIDE) 300-12.5 MG tablet Take 1 tablet by mouth daily.   LEVEMIR FLEXTOUCH 100 UNIT/ML Pen Inject 30 Units into the skin daily.   LORazepam (ATIVAN) 0.5 MG tablet Take 0.5 mg by mouth 2 (two) times daily as needed.   megestrol (MEGACE) 40 MG tablet Take 2 tablets (80 mg total) by mouth 2 (two) times daily.   OVER THE COUNTER MEDICATION Apply 1 application topically daily as needed (for back pain).   oxyCODONE-acetaminophen (PERCOCET) 7.5-325 MG tablet Take 1 tablet by mouth every 8 (eight) hours as needed.   pantoprazole (PROTONIX) 40 MG tablet Take 1 tablet by mouth daily.   rosuvastatin (CRESTOR) 10 MG tablet Take 1 tablet by mouth once a week.   zolpidem (AMBIEN) 10 MG tablet Take 10 mg by mouth at bedtime as needed for sleep.  (Patient not taking: Reported on 09/10/2020)   No facility-administered encounter medications on file as of 11/24/2020.    Allergy:  Allergies  Allergen Reactions   Shrimp [Shellfish Allergy]     Hives     Social Hx:   Social History   Socioeconomic History   Marital status: Single    Spouse name: Not on file   Number of children: Not on file   Years of education: Not on file   Highest education level: Not on file  Occupational History   Not on file  Tobacco Use   Smoking status: Former    Years: 10.00    Types: Cigarettes    Quit date: 1980    Years since quitting: 42.7   Smokeless tobacco: Never  Vaping Use    Vaping Use: Never used  Substance and Sexual Activity   Alcohol use: No   Drug use: No   Sexual activity: Not Currently    Birth control/protection: Post-menopausal  Other Topics Concern   Not on file  Social History Narrative   Not on file   Social Determinants of Health   Financial Resource Strain: Not on file  Food Insecurity: Not on file  Transportation Needs: Not on file  Physical Activity: Not on file  Stress: Not on file  Social Connections: Not on file  Intimate Partner Violence: Not on file    Past Surgical Hx:  Past Surgical History:  Procedure Laterality Date   CERVICAL POLYPECTOMY     CHOLECYSTECTOMY     COLONOSCOPY W/ POLYPECTOMY     TONSILLECTOMY     UTERINE FIBROID SURGERY      Past Medical Hx:  Past Medical History:  Diagnosis Date   Anxiety    Arthritis    Depression    Diabetes mellitus without  complication (HCC)    Heart murmur    no problems mild told in her 30's   Hypertension    Neuromuscular disorder (HCC)    neuropathy feet   Pancreatitis     Past Gynecological History: endometrial cancer. G0. No LMP recorded. Patient is postmenopausal.  Family Hx:  Family History  Problem Relation Age of Onset   Breast cancer Mother     Review of Systems:  Constitutional  Feels well,    ENT Normal appearing ears and nares bilaterally Skin/Breast  No rash, sores, jaundice, itching, dryness Cardiovascular  No chest pain, shortness of breath, or edema  Pulmonary  No cough or wheeze.  Gastro Intestinal  No nausea, vomitting, or diarrhoea. No bright red blood per rectum, no abdominal pain, change in bowel movement, or constipation.  Genito Urinary  No frequency, urgency, dysuria, + postmenopausal bleeding Musculo Skeletal  No myalgia, arthralgia, joint swelling or pain  Neurologic  No weakness, numbness, change in gait,  Psychology  No depression, anxiety, insomnia.   Vitals:  Blood pressure (!) 146/76, pulse 88, temperature 98.4 F  (36.9 C), temperature source Oral, resp. rate 18, SpO2 100 %.  Physical Exam: WD in NAD Neck  Supple NROM, without any enlargements.  Lymph Node Survey No cervical supraclavicular or inguinal adenopathy Cardiovascular  Pulse normal rate, regularity and rhythm. S1 and S2 normal.  Lungs  Clear to auscultation bilateraly, without wheezes/crackles/rhonchi. Good air movement.  Skin  No rash/lesions/breakdown  Psychiatry  Alert and oriented to person, place, and time  Abdomen  Normoactive bowel sounds, abdomen soft, non-tender and mildly obese without evidence of hernia.  Back No CVA tenderness Genito Urinary  Vulva/vagina: Normal external female genitalia.  No lesions. No discharge or bleeding.  Bladder/urethra:  No lesions or masses, well supported bladder  Vagina: normal  Cervix: Normal appearing, no lesions.  Uterus: Mildly enlarged, minimally mobile, no parametrial involvement or nodularity.  Adnexa: no palpable masses. Rectal  deferred Extremities  No bilateral cyanosis, clubbing or edema.   Thereasa Solo, MD  01/12/2021, 7:05 AM

## 2021-01-13 ENCOUNTER — Encounter (HOSPITAL_COMMUNITY): Payer: Self-pay | Admitting: Gynecologic Oncology

## 2021-01-13 ENCOUNTER — Telehealth: Payer: Self-pay

## 2021-01-13 NOTE — Telephone Encounter (Signed)
Spoke with Kimberly Hunt this morning. She states she is eating, drinking and urinating well. She has not had a BM yet and is not passing gas. Instructed her to pickup up senokot or miralax  and encouraged her to drink plenty of water to avoid constipation. She denies fever or chills. Incisions are dry and intact. Her pain is controlled with ibuprofen. Instructed to call office with any fever, chills, purulent drainage, uncontrolled pain or any other questions or concerns. Patient verbalizes understanding.  Pt aware of post op appointments as well as the office number [phone] and after hours number [phone] to call if she has any questions or concerns

## 2021-01-19 LAB — SURGICAL PATHOLOGY

## 2021-01-20 ENCOUNTER — Other Ambulatory Visit: Payer: Self-pay | Admitting: Adult Health

## 2021-01-20 DIAGNOSIS — C541 Malignant neoplasm of endometrium: Secondary | ICD-10-CM

## 2021-01-21 ENCOUNTER — Telehealth: Payer: Self-pay | Admitting: Oncology

## 2021-01-21 DIAGNOSIS — C541 Malignant neoplasm of endometrium: Secondary | ICD-10-CM

## 2021-01-21 LAB — MOLECULAR PATHOLOGY

## 2021-01-21 NOTE — Telephone Encounter (Signed)
Called Kimberly Hunt and advised her that the pathology from surgery showed endometrial cancer in the lining of the uterus. It did not go into the muscle wall.  Surgery will be the only treatment needed.  Also discussed that she will need to follow up every 6 months after her post op appointment on 02/01/21.  She verbalized understanding and agreement.  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  Appointment scheduled for 02/04/2021.

## 2021-01-21 NOTE — Telephone Encounter (Signed)
Left a message to review pathology from surgery.  Requested a return call.

## 2021-01-31 NOTE — Progress Notes (Signed)
Gynecologic Oncology Return Clinic Visit  02/01/21  Reason for Visit: follow-up after surgery, treatment discussion  Treatment History: Oncology History Overview Note  MMR intact, MSI-stable   Endometrial cancer ([place])  01/12/2021 Initial Diagnosis   Endometrial cancer ([place])   01/12/2021 Surgery   Operation: Robotic-assisted laparoscopic total hysterectomy with bilateral salpingoophorectomy, SLN biopsy.   01/12/2021 Pathologic Stage   Stage IA, grade 1, no MI, no LVSI     Interval History: Patient presents today for postoperative follow-up.  She denies any abdominal or pelvic pain.  She denies any vaginal bleeding.  She continues to have some constipation, has not been using medication but has something at home to help improve bowel function.  Denies any urinary symptoms.  Has had some right-sided buttock pain with radiation down her right leg and the side of her leg.  Also has noticed a small bump in her groin, new since surgery.  Past Medical/Surgical History: Past Medical History:  Diagnosis Date   Anxiety    Arthritis    Depression    Diabetes mellitus without complication ([place])    Heart murmur    no problems mild told in her 30's   Hypertension    Neuromuscular disorder (HCC)    neuropathy feet   Pancreatitis     Past Surgical History:  Procedure Laterality Date   CERVICAL POLYPECTOMY     CHOLECYSTECTOMY     COLONOSCOPY W/ POLYPECTOMY     ROBOTIC ASSISTED TOTAL HYSTERECTOMY WITH BILATERAL SALPINGO OOPHERECTOMY N/A 01/12/2021   Procedure: XI ROBOTIC ASSISTED TOTAL HYSTERECTOMY WITH BILATERAL SALPINGO OOPHORECTOMY;  Surgeon: Everitt Amber, MD;  Location: [facility];  Service: Gynecology;  Laterality: N/A;   SENTINEL NODE BIOPSY N/A 01/12/2021   Procedure: SENTINEL LYMPH NODE BIOPSY;  Surgeon: Everitt Amber, MD;  Location: [facility];  Service: Gynecology;  Laterality: N/A;   TONSILLECTOMY     UTERINE FIBROID SURGERY      Family History  Problem Relation Age of Onset   Breast  cancer Mother     Social History   Socioeconomic History   Marital status: Single    Spouse name: Not on file   Number of children: Not on file   Years of education: Not on file   Highest education level: Not on file  Occupational History   Not on file  Tobacco Use   Smoking status: Former    Years: 10.00    Types: Cigarettes    Quit date: 59    Years since quitting: 42.8   Smokeless tobacco: Never  Vaping Use   Vaping Use: Never used  Substance and Sexual Activity   Alcohol use: No   Drug use: No   Sexual activity: Not Currently    Birth control/protection: Post-menopausal  Other Topics Concern   Not on file  Social History Narrative   Not on file   Social Determinants of Health   Financial Resource Strain: Not on file  Food Insecurity: Not on file  Transportation Needs: Not on file  Physical Activity: Not on file  Stress: Not on file  Social Connections: Not on file    Current Medications:  Current Outpatient Medications:    acetaminophen (TYLENOL) 500 MG tablet, Take 1 tablet (500 mg total) by mouth every 6 (six) hours as needed., Disp: 30 tablet, Rfl: 0   amLODipine (NORVASC) 10 MG tablet, Take 10 mg by mouth daily., Disp: , Rfl:    aspirin-sod bicarb-citric acid (ALKA-SELTZER) 325 MG TBEF tablet, Take 325 mg by mouth  every 6 (six) hours as needed (indigestion/heart burn)., Disp: , Rfl:    blood glucose meter kit and supplies KIT, Dispense based on patient and insurance preference. Use up to four times daily as directed. (FOR ICD-9 250.00, 250.01)., Disp: 1 each, Rfl: 0   citalopram (CELEXA) 40 MG tablet, Take 40 mg by mouth every evening., Disp: , Rfl:    gabapentin (NEURONTIN) 300 MG capsule, Take 300 mg by mouth at bedtime., Disp: , Rfl:    ibuprofen (ADVIL) 800 MG tablet, Take 1 tablet (800 mg total) by mouth every 8 (eight) hours as needed., Disp: 30 tablet, Rfl: 0   insulin aspart (NOVOLOG FLEXPEN) 100 UNIT/ML FlexPen, Inject 0-20 Units into the skin 3  (three) times daily with meals. as directed up to 10 units, Disp: , Rfl:    irbesartan-hydrochlorothiazide (AVALIDE) 150-12.5 MG tablet, Take 1 tablet by mouth daily., Disp: , Rfl:    LORazepam (ATIVAN) 0.5 MG tablet, Take 0.5 mg by mouth 2 (two) times daily as needed for anxiety., Disp: , Rfl:    metFORMIN (GLUCOPHAGE-XR) 500 MG 24 hr tablet, Take 1,500 mg by mouth daily., Disp: , Rfl:    oxyCODONE (OXY IR/ROXICODONE) 5 MG immediate release tablet, Take 1 tablet (5 mg total) by mouth every 4 (four) hours as needed for severe pain., Disp: 10 tablet, Rfl: 0   TOUJEO MAX SOLOSTAR 300 UNIT/ML Solostar Pen, Inject 45 Units into the skin daily., Disp: , Rfl:    zolpidem (AMBIEN) 10 MG tablet, Take 10 mg by mouth at bedtime as needed for sleep., Disp: , Rfl: 1  Review of Systems: Denies appetite changes, fevers, chills, fatigue, unexplained weight changes. Denies hearing loss, neck lumps or masses, mouth sores, ringing in ears or voice changes. Denies cough or wheezing.  Denies shortness of breath. Denies chest pain or palpitations. Denies leg swelling. Denies abdominal distention, pain, blood in stools, diarrhea, nausea, vomiting, or early satiety. Denies pain with intercourse, dysuria, frequency, hematuria or incontinence. Denies hot flashes, pelvic pain, vaginal bleeding or vaginal discharge.   Denies joint pain or muscle pain/cramps. Denies itching, rash, or wounds. Denies dizziness, headaches, numbness or seizures. Denies swollen lymph nodes or glands, denies easy bruising or bleeding. Denies anxiety, depression, confusion, or decreased concentration.  Physical Exam: BP (!) 175/81 (BP Location: Left Arm, Patient Position: Sitting)   Pulse 89   Temp 98.8 F (37.1 C) (Oral)   Resp 18   Ht _0  (1.626 m)   Wt 192 lb (87.1 kg)   SpO2 99%   BMI 32.96 kg/m  General: Alert, oriented, no acute distress. HEENT: Normocephalic, atraumatic, sclera anicteric. Chest: Unlabored breathing on room  air. Abdomen: Obese, soft, nontender.  Normoactive bowel sounds.  No masses or hepatosplenomegaly appreciated.  Well-healed incisions, remaining Dermabond removed. Extremities: Grossly normal range of motion.  Warm, well perfused.  No edema bilaterally. Lymphatics: No cervical, supraclavicular, or inguinal adenopathy. GU: Normal appearing external genitalia without erythema, excoriation, or lesions.  There is an approximately 1 cm superficial mass along the right outer labia, nontender.  Speculum exam reveals moderately atrophic vaginal mucosa, oozing noted along right sidewall, cuff intact, suture still visible.  Bimanual exam reveals cuff intact, no fluctuance or tenderness with palpation.    Laboratory & Radiologic Studies: None new  Assessment & Plan: Kimberly Hunt is a 65 y.o. woman with Stage IA, grade 1 endometrioid endometrial adenocarcinoma who presents for treatment planning, post-op follow-up.  Patient is overall doing well from a postoperative standpoint.  We discussed continued activity restrictions as well as postoperative expectations.  In terms of her right buttock pain with radiation down her right leg, this sounds most consistent to sciatic pain.  We reviewed that the patient could try increasing her nighttime dose of gabapentin to see if this helps, she endorses having more pain at night.  If persistent without improvement in another 3-4 weeks, then I have recommended that she see her primary care for further work-up and treatment.  I think this is unlikely related to the surgery itself.  Labial lesion may be a lymph node, although is very superficial.  Could also be injury related to the surgery itself versus a lipoma.  Patient does not have symptoms or exam findings that appear consistent with Candida and the lesion is very well defined.  We will plan to have a phone call in several weeks.  If it has not improved or has increased in size, we will plan to have her come back for  possible biopsy versus excision.  We discussed pathology and patient was given a pathology report from her surgery.  Given early stage low risk disease, discussed that no adjuvant treatment is indicated.  Reviewed signs and symptoms that would be concerning for disease recurrence.  Per NCCN surveillance recommendations, we will plan for surveillance visits every 6 months.  36 minutes of total time was spent for this patient encounter, including preparation, face-to-face counseling with the patient and coordination of care, and documentation of the encounter.  Jeral Pinch, MD  Division of Gynecologic Oncology  Department of Obstetrics and Gynecology  [facility]

## 2021-02-01 ENCOUNTER — Inpatient Hospital Stay: Payer: Medicare Other | Attending: Gynecologic Oncology | Admitting: Gynecologic Oncology

## 2021-02-01 ENCOUNTER — Other Ambulatory Visit: Payer: Self-pay

## 2021-02-01 ENCOUNTER — Encounter: Payer: Self-pay | Admitting: Gynecologic Oncology

## 2021-02-01 VITALS — BP 175/81 | HR 89 | Temp 98.8°F | Resp 18 | Ht 64.0 in | Wt 192.0 lb

## 2021-02-01 DIAGNOSIS — Z90722 Acquired absence of ovaries, bilateral: Secondary | ICD-10-CM

## 2021-02-01 DIAGNOSIS — Z90710 Acquired absence of both cervix and uterus: Secondary | ICD-10-CM

## 2021-02-01 DIAGNOSIS — Z7189 Other specified counseling: Secondary | ICD-10-CM

## 2021-02-01 DIAGNOSIS — C541 Malignant neoplasm of endometrium: Secondary | ICD-10-CM

## 2021-02-01 NOTE — Patient Instructions (Addendum)
It was nice to meet you today.  The pain that you are describing that starts along your right buttocks and travels down your leg sounds most consistent to me with sciatica.  I wonder if the positioning during surgery and the length of surgery exacerbated something.  I think you could try taking an increased dose of gabapentin for a couple nights and see if this helps, since mostly her pain seems to be at night.  Please keep an eye on the bump on your labia.  We will discuss at your phone visit whether this has grown in size or is any more less painful.  From a surgery standpoint, you are healing very well.  Remember no lifting more than 10 pounds until 6 weeks after surgery and nothing in the vagina for at least 8 weeks.  We discussed today that plan from a follow-up standpoint after your surgery will be visits every 6 months either with me or alternating between my office and your gynecologist office.  If you develop any symptoms that would be concerning for recurrence, such as vaginal bleeding or pelvic pain, please call to see me sooner than your next scheduled visit.

## 2021-02-02 ENCOUNTER — Encounter: Payer: BC Managed Care – PPO | Admitting: Gynecologic Oncology

## 2021-02-04 ENCOUNTER — Inpatient Hospital Stay (HOSPITAL_BASED_OUTPATIENT_CLINIC_OR_DEPARTMENT_OTHER): Payer: Medicare Other | Admitting: *Deleted

## 2021-02-04 ENCOUNTER — Inpatient Hospital Stay: Payer: Medicare Other | Attending: Gynecologic Oncology | Admitting: Genetic Counselor

## 2021-02-04 ENCOUNTER — Other Ambulatory Visit: Payer: Self-pay | Admitting: Genetic Counselor

## 2021-02-04 ENCOUNTER — Inpatient Hospital Stay: Payer: Medicare Other

## 2021-02-04 ENCOUNTER — Encounter: Payer: Self-pay | Admitting: *Deleted

## 2021-02-04 ENCOUNTER — Other Ambulatory Visit: Payer: Self-pay

## 2021-02-04 VITALS — BP 207/104 | HR 102 | Ht 64.0 in | Wt 192.0 lb

## 2021-02-04 DIAGNOSIS — Z803 Family history of malignant neoplasm of breast: Secondary | ICD-10-CM | POA: Diagnosis not present

## 2021-02-04 DIAGNOSIS — Z8042 Family history of malignant neoplasm of prostate: Secondary | ICD-10-CM | POA: Diagnosis not present

## 2021-02-04 DIAGNOSIS — C541 Malignant neoplasm of endometrium: Secondary | ICD-10-CM

## 2021-02-04 LAB — GENETIC SCREENING ORDER

## 2021-02-05 ENCOUNTER — Encounter: Payer: Self-pay | Admitting: *Deleted

## 2021-02-05 NOTE — Progress Notes (Signed)
Pt was seen today for her Survivorship appt. Survivorship assessment and SDOH completed. Pt's BP was elevated checked twice. She had not taken any of her BP meds prior to visit. No complaints of pain or dizziness. RN instructed her to take meds as soon as she returned home.

## 2021-02-08 ENCOUNTER — Encounter: Payer: Self-pay | Admitting: Genetic Counselor

## 2021-02-08 DIAGNOSIS — Z803 Family history of malignant neoplasm of breast: Secondary | ICD-10-CM

## 2021-02-08 DIAGNOSIS — Z8042 Family history of malignant neoplasm of prostate: Secondary | ICD-10-CM

## 2021-02-08 HISTORY — DX: Family history of malignant neoplasm of prostate: Z80.42

## 2021-02-08 HISTORY — DX: Family history of malignant neoplasm of breast: Z80.3

## 2021-02-08 NOTE — Progress Notes (Signed)
REFERRING PROVIDER: Dorothyann Gibbs, NP Kimberly Hunt,  Kimberly Hunt [id number]  PRIMARY PROVIDER:  Shirline Frees, MD  PRIMARY REASON FOR VISIT:  1. Endometrial cancer ([place])   2. Family history of breast cancer   3. Family history of prostate cancer     HISTORY OF PRESENT ILLNESS:   Kimberly Hunt, a 65 y.o. female, was seen for a [facility] cancer genetics consultation at the request of Kimberly John, NP due to a personal and family history of cancer.  Kimberly Hunt presents to clinic today to discuss the possibility of a hereditary predisposition to cancer, to discuss genetic testing, and to further clarify her future cancer risks, as well as potential cancer risks for family members.   In 2022, at the age of 58, Kimberly Hunt was diagnosed with endometrial cancer with tubual epithelial hyperplasia and normal mismatch repair IHC and microsatellite stability.  CANCER HISTORY:  Oncology History Overview Note  MMR intact, MSI-stable   Endometrial cancer ([place])  01/12/2021 Initial Diagnosis   Endometrial cancer ([place])   01/12/2021 Surgery   Operation: Robotic-assisted laparoscopic total hysterectomy with bilateral salpingoophorectomy, SLN biopsy.   01/12/2021 Pathologic Stage   Stage IA, grade 1, no MI, no LVSI    RISK FACTORS:  Menarche was at age 37 or 7.  Nulliparous.  OCP use for approximately 2 years.  Ovaries intact: no.  Hysterectomy: yes.  Menopausal status: postmenopausal.  HRT use: 0 years. Colonoscopy: yes;  most recent in 2016; less than 10 lifetime colon polyps . Mammogram within the last year: yes. Number of breast biopsies: 0. Up to date with pelvic exams: yes.   Past Medical History:  Diagnosis Date   Anxiety    Arthritis    Depression    Diabetes mellitus without complication ([place])    Family history of breast cancer 02/08/2021   Family history of prostate cancer 02/08/2021   Heart murmur    no problems mild told in her 30's   Hypertension     Neuromuscular disorder (HCC)    neuropathy feet   Pancreatitis     Past Surgical History:  Procedure Laterality Date   CERVICAL POLYPECTOMY     CHOLECYSTECTOMY     COLONOSCOPY W/ POLYPECTOMY     ROBOTIC ASSISTED TOTAL HYSTERECTOMY WITH BILATERAL SALPINGO OOPHERECTOMY N/A 01/12/2021   Procedure: XI ROBOTIC ASSISTED TOTAL HYSTERECTOMY WITH BILATERAL SALPINGO OOPHORECTOMY;  Surgeon: Kimberly Amber, MD;  [facility];  Service: Gynecology;  Laterality: N/A;   SENTINEL NODE BIOPSY N/A 01/12/2021   Procedure: SENTINEL LYMPH NODE BIOPSY;  Surgeon: Kimberly Amber, MD;  [facility];  Service: Gynecology;  Laterality: N/A;   TONSILLECTOMY     UTERINE FIBROID SURGERY      FAMILY HISTORY:  We obtained a detailed, 4-generation family history.  Significant diagnoses are listed below: Family History  Problem Relation Age of Onset   Breast cancer Mother        dx after 34   Cancer Maternal Aunt        unknown type; dx after 66   Cancer Maternal Uncle        unknown type; dx after 61, x2 mat uncles   Cancer Paternal Aunt        unknown type; dx after 62   Prostate cancer Paternal Uncle        dx after 79; mets     Kimberly Hunt is unaware of previous family history of genetic testing for hereditary cancer risks. There is  no reported Ashkenazi Jewish ancestry. There is no known consanguinity.  GENETIC COUNSELING ASSESSMENT: Kimberly Hunt is a 65 y.o. female with a personal and family history of cancer which is somewhat suggestive of a hereditary cancer syndrome and predisposition to cancer given the presence of related cancers in the family and the presence of metastatic breast cancer. We, therefore, discussed and recommended the following at today's visit.   DISCUSSION: We discussed that 5 - 10% of cancer is hereditary, with most cases of heredtiary endometrial cancer associated with Lynch syndrome genes.  There are other genes that can be associated with hereditary breast and prostate cancer  syndromes.  These include but are not limited to BRCA1/2.  We discussed that testing is beneficial for several reasons including knowing how to follow individuals after completing their treatment, identifying whether potential treatment options would be beneficial, and understanding if other family members could be at risk for cancer and allowing them to undergo genetic testing.   We reviewed the characteristics, features and inheritance patterns of hereditary cancer syndromes. We also discussed genetic testing, including the appropriate family members to test, the process of testing, insurance coverage and turn-around-time for results. We discussed the implications of a negative, positive, carrier and/or variant of uncertain significant result. We recommended Kimberly Hunt pursue genetic testing for a panel that includes genes associated with endometrial, breast, and prostate cancer.   Kimberly Hunt  was offered a common hereditary cancer panel (~50 genes) and an expanded pan-cancer panel (~80 genes). Kimberly Hunt was informed of the benefits and limitations of each panel, including that expanded pan-cancer panels contain genes that do not have clear management guidelines at this point in time.  We also discussed that as the number of genes included on a panel increases, the chances of variants of uncertain significance increases.  After considering the benefits and limitations of each gene panel, Kimberly Hunt elected to have a common hereditary cancers panel through [facility].  The CustomNext-Cancer+RNAinsight panel offered by [facility] includes sequencing and rearrangement analysis for the following 47 genes:  APC, ATM, AXIN2, BARD1, BMPR1A, BRCA1, BRCA2, BRIP1, CDH1, CDK4, CDKN2A, CHEK2, DICER1, EPCAM, GREM1, HOXB13, MEN1, MLH1, MSH2, MSH3, MSH6, MUTYH, NBN, NF1, NF2, NTHL1, PALB2, PMS2, POLD1, POLE, PTEN, RAD51C, RAD51D, RECQL, RET, SDHA, SDHAF2, SDHB, SDHC, SDHD, SMAD4, SMARCA4, STK11, TP53, TSC1,  TSC2, and VHL.  RNA data is routinely analyzed for use in variant interpretation for all genes.  Based on Kimberly Hunt's personal and family history of cancer, Hunt meets medical criteria for genetic testing. Despite that Hunt meets criteria, Hunt may still have an out of pocket cost. We discussed that if her out of pocket cost for testing is over $100, the laboratory should contact her to discuss self-pay price or patient pay assistance program.   PLAN: After considering the risks, benefits, and limitations, Ms. Straley provided informed consent to pursue genetic testing and the blood sample was sent to [facility] for analysis of the CancerNext-Expanded +RNAinsight Panel. Results should be available within approximately 2-3 weeks' time, at which point they will be disclosed by telephone to Ms. Ciaravino, as will any additional recommendations warranted by these results. Ms. Glaza will receive a summary of her genetic counseling visit and a copy of her results once available. This information will also be available in Epic.   Lastly, we encouraged Ms. Digilio to remain in contact with cancer genetics annually so that we can continuously update the family history and inform her of any changes  in cancer genetics and testing that may be of benefit for this family.   Ms. Millis questions were answered to her satisfaction today. Our contact information was provided should additional questions or concerns arise. Thank you for the referral and allowing [place] to share in the care of your patient.   Davine Coba M. Joette Catching, Angie, [facility] Genetic Counselor [email or web address] (P) [phone]  The patient was seen for a total of 30 minutes in face-to-face genetic counseling.  The patient was seen alone.  Drs. Magrinat, Lindi Adie and/or Burr Medico were available to discuss this case as needed.   _______________________________________________________________________ For Office Staff:  Number of people involved in  session: 1 Was an Intern/ student involved with case: no

## 2021-02-22 ENCOUNTER — Encounter: Payer: Self-pay | Admitting: Genetic Counselor

## 2021-02-22 ENCOUNTER — Telehealth: Payer: Self-pay | Admitting: Genetic Counselor

## 2021-02-22 ENCOUNTER — Ambulatory Visit: Payer: Self-pay | Admitting: Genetic Counselor

## 2021-02-22 DIAGNOSIS — C541 Malignant neoplasm of endometrium: Secondary | ICD-10-CM

## 2021-02-22 DIAGNOSIS — Z8042 Family history of malignant neoplasm of prostate: Secondary | ICD-10-CM

## 2021-02-22 DIAGNOSIS — Z1379 Encounter for other screening for genetic and chromosomal anomalies: Secondary | ICD-10-CM

## 2021-02-22 DIAGNOSIS — Z803 Family history of malignant neoplasm of breast: Secondary | ICD-10-CM

## 2021-02-22 NOTE — Telephone Encounter (Signed)
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  Discussed that we do not know why she has endometrial cancer or why there is cancer in the family. It could be sporadic/familial, due to a different gene that we are not testing, or maybe our current technology may not be able to pick something up.  It will be important for her to keep in contact with genetics to keep up with whether additional testing may be needed.

## 2021-02-22 NOTE — Progress Notes (Signed)
HPI:   Kimberly Hunt was previously seen in the [place] clinic due to a personal history of endometrial cancer, family history of breast/prostate cancers, and concerns regarding a hereditary predisposition to cancer. Please refer to our prior cancer genetics clinic note for more information regarding our discussion, assessment and recommendations, at the time. Kimberly Hunt recent genetic test results were disclosed to her, as were recommendations warranted by these results. These results and recommendations are discussed in more detail below.  CANCER HISTORY:  Oncology History Overview Note  MMR intact, MSI-stable   Endometrial cancer ([place])  01/12/2021 Initial Diagnosis   Endometrial cancer ([place])   01/12/2021 Surgery   Operation: Robotic-assisted laparoscopic total hysterectomy with bilateral salpingoophorectomy, SLN biopsy.   01/12/2021 Pathologic Stage   Stage IA, grade 1, no MI, no LVSI   02/16/2021 Genetic Testing   Negative hereditary cancer genetic testing: no pathogenic variants detected in [facility] CustomNext-Cancer +RNAinsight Panel.  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 The report date is February 16, 2021.   The CustomNext-Cancer+RNAinsight panel offered by [facility] includes sequencing and rearrangement analysis for the following 47 genes:  APC, ATM, AXIN2, BARD1, BMPR1A, BRCA1, BRCA2, BRIP1, CDH1, CDK4, CDKN2A, CHEK2, DICER1, EPCAM, GREM1, HOXB13, MEN1, MLH1, MSH2, MSH3, MSH6, MUTYH, NBN, NF1, NF2, NTHL1, PALB2, PMS2, POLD1, POLE, PTEN, RAD51C, RAD51D, RECQL, RET, SDHA, SDHAF2, SDHB, SDHC, SDHD, SMAD4, SMARCA4, STK11, TP53, TSC1, TSC2, and VHL.  RNA data is routinely analyzed for use in variant interpretation for all genes.     FAMILY HISTORY:  We obtained a detailed, 4-generation family history.  Significant diagnoses are listed below: Family History  Problem Relation Age of Onset   Breast cancer Mother        dx  after 66   Cancer Maternal Aunt        unknown type; dx after 35   Cancer Maternal Uncle        unknown type; dx after 20, x2 mat uncles   Cancer Paternal Aunt        unknown type; dx after 79   Prostate cancer Paternal Uncle        dx after 28; mets     Kimberly Hunt is unaware of previous family history of genetic testing for hereditary cancer risks. There is no reported Ashkenazi Jewish ancestry. There is no known consanguinity.    GENETIC TEST RESULTS:  The CustomNext-Cancer +RNAinsight Panel found no pathogenic mutations. The CustomNext-Cancer+RNAinsight panel offered by [facility] includes sequencing and rearrangement analysis for the following 47 genes:  APC, ATM, AXIN2, BARD1, BMPR1A, BRCA1, BRCA2, BRIP1, CDH1, CDK4, CDKN2A, CHEK2, DICER1, EPCAM, GREM1, HOXB13, MEN1, MLH1, MSH2, MSH3, MSH6, MUTYH, NBN, NF1, NF2, NTHL1, PALB2, PMS2, POLD1, POLE, PTEN, RAD51C, RAD51D, RECQL, RET, SDHA, SDHAF2, SDHB, SDHC, SDHD, SMAD4, SMARCA4, STK11, TP53, TSC1, TSC2, and VHL.  RNA data is routinely analyzed for use in variant interpretation for all genes.  The test report has been scanned into EPIC and is located under the Molecular Pathology section of the Results Review tab.  A portion of the result report is included below for reference. Genetic testing reported out on February 16, 2021.     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  At this time, it is unknown if this variant is associated with an increased risk for cancer or if it is benign, but most uncertain variants are reclassified to benign. It should not be  used to make medical management decisions. With time, we suspect the laboratory will determine the significance of this variant, if any. If the laboratory reclassifies this variant, we will attempt to contact Kimberly Hunt to discuss it further.   Even though a pathogenic variant was not identified, possible  explanations for the cancer in the family may include: There may be no hereditary risk for cancer in the family. The cancers in Kimberly Hunt and/or her family may be sporadic/familial or due to other genetic and environmental factors. There may be a gene mutation in one of these genes that current testing methods cannot detect but that chance is small. There could be another gene that has not yet been discovered, or that we have not yet tested, that is responsible for the cancer diagnoses in the family.  It is also possible there is a hereditary cause for the cancer in the family that Kimberly Hunt did not inherit. The variant of uncertain significance detected in the BRIP1 gene may be reclassified as a pathogenic variant in the future. At this time, we do not know if this variant increases the risk for cancer.  Therefore, it is important to remain in touch with cancer genetics in the future so that we can continue to offer Kimberly Hunt the most up to date genetic testing.     ADDITIONAL GENETIC TESTING:  We discussed with Kimberly Hunt that her genetic testing was fairly extensive.  If there are genes identified to increase cancer risk that can be analyzed in the future, we would be happy to discuss and coordinate this testing at that time.    CANCER SCREENING RECOMMENDATIONS:  Kimberly Hunt test result is considered negative (normal).  This means that we have not identified a hereditary cause for her personal history of cancer at this time.   An individual's cancer risk and medical management are not determined by genetic test results alone. Overall cancer risk assessment incorporates additional factors, including personal medical history, family history, and any available genetic information that may result in a personalized plan for cancer prevention and surveillance. Therefore, it is recommended she continue to follow the cancer management and screening guidelines provided by her oncology and primary  healthcare provider.  RECOMMENDATIONS FOR FAMILY MEMBERS:   Individuals in this family might be at some increased risk of developing cancer, over the general population risk, due to the family history of cancer.  Individuals in the family should notify their providers of the family history of cancer. We recommend women in this family have a yearly mammogram beginning at age 78, or 59 years younger than the earliest onset of cancer, an annual clinical breast exam, and perform monthly breast self-exams.  Family members should have colonoscopies by at age 37, or earlier, as recommended by their providers.  Other members of the family may still carry a pathogenic variant in one of these genes that Kimberly Hunt did not inherit. Based on the family history of metastatic prostate cancer in her deceased paternal uncle, we recommend first degree relatives of her paternal uncle have genetic counseling and testing. Ms. Peaden can let us know if we can be of any assistance in coordinating genetic counseling and/or testing for this family member.   We do not recommend familial testing for the BRIP1 variant of uncertain significance (VUS).  FOLLOW-UP:  Lastly, we discussed with Kimberly Hunt that cancer genetics is a rapidly advancing field and it is possible that new genetic tests will be appropriate for her  and/or her family members in the future. We encouraged her to remain in contact with cancer genetics on an annual basis so we can update her personal and family histories and let her know of advances in cancer genetics that may benefit this family.   Our contact number was provided. Kimberly Hunt questions were answered to her satisfaction, and she knows she is welcome to call us at anytime with additional questions or concerns.   Nayda Riesen M. Joette Catching, [place], [facility] Genetic Counselor [email or web address] (P) [phone]

## 2021-03-02 ENCOUNTER — Inpatient Hospital Stay: Payer: Medicare Other | Attending: Gynecologic Oncology | Admitting: Gynecologic Oncology

## 2021-03-02 ENCOUNTER — Telehealth: Payer: Self-pay | Admitting: Gynecologic Oncology

## 2021-03-02 NOTE — Telephone Encounter (Signed)
Call patient after phone visit time secondary to being in surgery. No answer. Left voicemail saying I'd try again tomorrow or she can call the office in the am.  Jeral Pinch MD Gynecologic Oncology

## 2021-03-03 ENCOUNTER — Telehealth: Payer: Self-pay | Admitting: Gynecologic Oncology

## 2021-03-03 NOTE — Telephone Encounter (Signed)
Call patient again after missed phone visit yesterday.  She overall is feeling better than at her visit last month.  She notes sciatica has completely resolved as has the small lesion on her labia.  The last week, she has had some pain in her stomach as well as some mild associated nausea.  She feels better today.  Is unsure whether this is related to foods that she is eating.  She endorses normal bowel function.  I have asked her to keep a diary of what she is eating and when she has the pain.  If pain is worsening, I have asked her to call me back.  I also encouraged her to drink plenty of water and to use something for bowel function for the next week or 2 to see if she may have a little bit of constipation despite more or less regular bowel movements.  Jeral Pinch MD Gynecologic Oncology

## 2022-02-08 ENCOUNTER — Emergency Department (HOSPITAL_COMMUNITY): Payer: Medicare PPO

## 2022-02-08 ENCOUNTER — Emergency Department (HOSPITAL_COMMUNITY)
Admission: EM | Admit: 2022-02-08 | Discharge: 2022-02-09 | Disposition: A | Discharge status: Home / Self Care | Payer: Medicare PPO | Attending: Emergency Medicine | Admitting: Emergency Medicine

## 2022-02-08 ENCOUNTER — Encounter (HOSPITAL_COMMUNITY): Payer: Self-pay | Admitting: Emergency Medicine

## 2022-02-08 DIAGNOSIS — Z7984 Long term (current) use of oral hypoglycemic drugs: Secondary | ICD-10-CM | POA: Insufficient documentation

## 2022-02-08 DIAGNOSIS — Z794 Long term (current) use of insulin: Secondary | ICD-10-CM | POA: Diagnosis not present

## 2022-02-08 DIAGNOSIS — R1031 Right lower quadrant pain: Secondary | ICD-10-CM | POA: Diagnosis not present

## 2022-02-08 DIAGNOSIS — Z79899 Other long term (current) drug therapy: Secondary | ICD-10-CM | POA: Insufficient documentation

## 2022-02-08 DIAGNOSIS — E1165 Type 2 diabetes mellitus with hyperglycemia: Secondary | ICD-10-CM | POA: Insufficient documentation

## 2022-02-08 DIAGNOSIS — R1011 Right upper quadrant pain: Secondary | ICD-10-CM

## 2022-02-08 DIAGNOSIS — I10 Essential (primary) hypertension: Secondary | ICD-10-CM | POA: Diagnosis not present

## 2022-02-08 LAB — COMPREHENSIVE METABOLIC PANEL
ALT: 12 U/L (ref 0–44)
AST: 16 U/L (ref 15–41)
Albumin: 4.4 g/dL (ref 3.5–5.0)
Alkaline Phosphatase: 82 U/L (ref 38–126)
Anion gap: 8 (ref 5–15)
BUN: 27 mg/dL — ABNORMAL HIGH (ref 8–23)
CO2: 25 mmol/L (ref 22–32)
Calcium: 9.4 mg/dL (ref 8.9–10.3)
Chloride: 107 mmol/L (ref 98–111)
Creatinine, Ser: 1.3 mg/dL — ABNORMAL HIGH (ref 0.44–1.00)
GFR, Estimated: 45 mL/min — ABNORMAL LOW (ref >60)
Glucose, Bld: 117 mg/dL — ABNORMAL HIGH (ref 70–99)
Potassium: 3.9 mmol/L (ref 3.5–5.1)
Sodium: 140 mmol/L (ref 135–145)
Total Bilirubin: 0.9 mg/dL (ref 0.3–1.2)
Total Protein: 8 g/dL (ref 6.5–8.1)

## 2022-02-08 LAB — CBC
HCT: 37.2 % (ref 36.0–46.0)
Hemoglobin: 12.4 g/dL (ref 12.0–15.0)
MCH: 28.8 pg (ref 26.0–34.0)
MCHC: 33.3 g/dL (ref 30.0–36.0)
MCV: 86.3 fL (ref 80.0–100.0)
Platelets: 454 10*3/uL — ABNORMAL HIGH (ref 150–400)
RBC: 4.31 MIL/uL (ref 3.87–5.11)
RDW: 12.8 % (ref 11.5–15.5)
WBC: 8.8 10*3/uL (ref 4.0–10.5)
nRBC: 0 % (ref 0.0–0.2)

## 2022-02-08 LAB — LIPASE, BLOOD: Lipase: 28 U/L (ref 11–51)

## 2022-02-08 MED ORDER — SODIUM CHLORIDE (PF) 0.9 % IJ SOLN
INTRAMUSCULAR | Status: AC
  Filled 2022-02-08: qty 50

## 2022-02-08 MED ORDER — IOHEXOL 300 MG/ML  SOLN
75.0000 mL | Freq: Once | INTRAMUSCULAR | Status: AC | PRN
  Administered 2022-02-08: 80 mL via INTRAVENOUS

## 2022-02-08 NOTE — ED Provider Triage Note (Signed)
Emergency Medicine Provider Triage Evaluation Note  MARWA FUHRMAN , a 66 y.o. female  was evaluated in triage.  Pt complains of R sided abd pain for 1.5 weeks. States it was intermittent but now constant. No vomiting occasional mild nausea. No CP or SOB.   No fever, no dysuria or frequency. No diarrhea.    Review of Systems  Positive: Abd pain Negative: Fever  Physical Exam  BP (!) 205/90 (BP Location: Right Arm) Comment: patient takes b/p medication however she has not took it today  Pulse 89   Temp 98.2 F (36.8 C) (Oral)   Resp 16   SpO2 100%  Gen:   Awake, no distress, comfortable, non-toxic Resp:  Normal effort  MSK:   Moves extremities without difficulty  Other:  Mild RUQ abd TTP no guarding, neg murphy sign  Medical Decision Making  Medically screening exam initiated at 6:00 PM.  Appropriate orders placed.  ARRIYAH MADEJ was informed that the remainder of the evaluation will be completed by another provider, this initial triage assessment does not replace that evaluation, and the importance of remaining in the ED until their evaluation is complete.  Abd labs, CT AP  Ultimately pt has a very benign exam. Well appearing. Hypertensive but has not taken BP meds today.    Pati Gallo [place], [place] 02/08/22 [phone]

## 2022-02-08 NOTE — ED Notes (Signed)
Needs IV for CT  

## 2022-02-08 NOTE — ED Notes (Signed)
Pt ambulated with steady gait to the bathroom. Pt alert and oriented.

## 2022-02-08 NOTE — ED Triage Notes (Signed)
Patient here from home reporting right sided abd pain x 1.5 weeks. No n/v.

## 2022-02-09 LAB — URINALYSIS, ROUTINE W REFLEX MICROSCOPIC
Bacteria, UA: NONE SEEN
Bilirubin Urine: NEGATIVE
Glucose, UA: NEGATIVE mg/dL
Ketones, ur: NEGATIVE mg/dL
Nitrite: NEGATIVE
Protein, ur: >=300 mg/dL — AB
Specific Gravity, Urine: 1.029 (ref 1.005–1.030)
pH: 5 (ref 5.0–8.0)

## 2022-02-09 NOTE — ED Provider Notes (Signed)
[facility] DEPT Provider Note   CSN: [id number] Arrival date & time: 02/08/22  1717     History  Chief Complaint  Patient presents with   Abdominal Pain    Kimberly Hunt is a 66 y.o. female.  HPI   Medical history including hypertension, pancreatitis, diabetes, anxiety abdominal surgery including cholecystectomy, fibroid surgery removal presents with complaints of right upper quadrant tenderness, going on for last 1.5 weeks, states pain was initially intermittent but has become slightly more constant, states that pain is improved after bowel movements, she denies any nausea or vomiting denies any melena or hematochezia no diarrhea denies any urinary symptoms, she has no chest pain pleuritic chest pain no shortness of breath no history of PEs or DVTs currently  not on hormone therapy there is been no recent surgeries no long immobilizations denies any calf pain or leg tenderness.  States she  never had this past, she does know that she been sitting more constipated concern is could be the cause.    Home Medications Prior to Admission medications   Medication Sig Start Date End Date Taking? Authorizing Provider  acetaminophen (TYLENOL) 500 MG tablet Take 1 tablet (500 mg total) by mouth every 6 (six) hours as needed. Patient not taking: Reported on 02/04/2021 01/12/21   Everitt Amber, MD  amLODipine (NORVASC) 10 MG tablet Take 10 mg by mouth daily. 08/31/20   [provider]  aspirin-sod bicarb-citric acid (ALKA-SELTZER) 325 MG TBEF tablet Take 325 mg by mouth every 6 (six) hours as needed (indigestion/heart burn). Patient not taking: Reported on 02/04/2021    [provider]  blood glucose meter kit and supplies KIT Dispense based on patient and insurance preference. Use up to four times daily as directed. (FOR ICD-9 250.00, 250.01). 06/04/14   Bonnielee Haff, MD  citalopram (CELEXA) 40 MG tablet Take 40 mg by mouth every evening.     [provider]  gabapentin (NEURONTIN) 300 MG capsule Take 300 mg by mouth at bedtime. 08/25/20   [provider]  ibuprofen (ADVIL) 800 MG tablet Take 1 tablet (800 mg total) by mouth every 8 (eight) hours as needed. Patient not taking: Reported on 02/04/2021 01/12/21   Everitt Amber, MD  insulin aspart (NOVOLOG FLEXPEN) 100 UNIT/ML FlexPen Inject 0-20 Units into the skin 3 (three) times daily with meals. as directed up to 10 units 09/04/19   [provider]  irbesartan-hydrochlorothiazide (AVALIDE) 150-12.5 MG tablet Take 1 tablet by mouth daily. 01/01/21   [provider]  LORazepam (ATIVAN) 0.5 MG tablet Take 0.5 mg by mouth 2 (two) times daily as needed for anxiety. 08/18/20   [provider]  metFORMIN (GLUCOPHAGE-XR) 500 MG 24 hr tablet Take 1,500 mg by mouth daily. 12/21/20   [provider]  TOUJEO MAX SOLOSTAR 300 UNIT/ML Solostar Pen Inject 45 Units into the skin daily. 11/20/20   [provider]  zolpidem (AMBIEN) 10 MG tablet Take 10 mg by mouth at bedtime as needed for sleep. Patient not taking: Reported on 02/04/2021 10/31/16   [provider]      Allergies    Shrimp [shellfish allergy]    Review of Systems   Review of Systems  Constitutional:  Negative for chills and fever.  Respiratory:  Negative for shortness of breath.   Cardiovascular:  Negative for chest pain.  Gastrointestinal:  Positive for abdominal pain.  Neurological:  Negative for headaches.    Physical Exam Updated Vital Signs BP (!) 151/78  Pulse 88   Temp 98.2 F (36.8 C) (Oral)   Resp 17   SpO2 99%  Physical Exam Vitals and nursing note reviewed.  Constitutional:      General: She is not in acute distress.    Appearance: She is not ill-appearing.  HENT:     Head: Normocephalic and atraumatic.     Nose: No congestion.  Eyes:     Conjunctiva/sclera: Conjunctivae normal.  Cardiovascular:     Rate and Rhythm: Normal rate and regular  rhythm.     Pulses: Normal pulses.     Heart sounds: No murmur heard.    No friction rub. No gallop.  Pulmonary:     Effort: No respiratory distress.     Breath sounds: No wheezing, rhonchi or rales.  Abdominal:     Palpations: Abdomen is soft.     Tenderness: There is abdominal tenderness. There is no right CVA tenderness or left CVA tenderness.     Comments: Abdomen nondistended, soft, very minimal right-sided pain without guarding represents peritoneal sign negative Murphy sign no CVA tenderness she had no palpable pain along her rib cage.  Musculoskeletal:     Right lower leg: No edema.     Left lower leg: No edema.     Comments: No leg swelling no calf tenderness  Skin:    General: Skin is warm and dry.  Neurological:     Mental Status: She is alert.  Psychiatric:        Mood and Affect: Mood normal.     ED Results / Procedures / Treatments   Labs (all labs ordered are listed, but only abnormal results are displayed) Labs Reviewed  COMPREHENSIVE METABOLIC PANEL - Abnormal; Notable for the following components:      Result Value   Glucose, Bld 117 (*)    BUN 27 (*)    Creatinine, Ser 1.30 (*)    GFR, Estimated 45 (*)    All other components within normal limits  CBC - Abnormal; Notable for the following components:   Platelets 454 (*)    All other components within normal limits  URINALYSIS, ROUTINE W REFLEX MICROSCOPIC - Abnormal; Notable for the following components:   Hgb urine dipstick SMALL (*)    Protein, ur >=300 (*)    Leukocytes,Ua SMALL (*)    All other components within normal limits  LIPASE, BLOOD    EKG None  Radiology CT ABDOMEN PELVIS W CONTRAST  Result Date: 02/08/2022 CLINICAL DATA:  Epigastric pain EXAM: CT ABDOMEN AND PELVIS WITH CONTRAST TECHNIQUE: Multidetector CT imaging of the abdomen and pelvis was performed using the standard protocol following bolus administration of intravenous contrast. RADIATION DOSE REDUCTION: This exam was  performed according to the departmental dose-optimization program which includes automated exposure control, adjustment of the mA and/or kV according to patient size and/or use of iterative reconstruction technique. CONTRAST:  8m OMNIPAQUE IOHEXOL 300 MG/ML  SOLN COMPARISON:  12/23/2016 FINDINGS: Lower chest: No acute abnormality Hepatobiliary: No focal liver abnormality is seen. Status post cholecystectomy. No biliary dilatation. Pancreas: No focal abnormality or ductal dilatation. Spleen: No focal abnormality.  Normal size. Adrenals/Urinary Tract: No adrenal abnormality. No focal renal abnormality. No stones or hydronephrosis. Urinary bladder is unremarkable. Stomach/Bowel: Normal appendix. Stomach, large and small bowel grossly unremarkable. Vascular/Lymphatic: No evidence of aneurysm or adenopathy. Scattered aortoiliac atherosclerosis. Reproductive: Prior hysterectomy.  No adnexal masses. Other: No free fluid or free air. Musculoskeletal: No acute bony abnormality. IMPRESSION: No acute findings in the  abdomen or pelvis. Scattered aortic atherosclerosis. Electronically Signed   By: Rolm Baptise M.D.   On: 02/08/2022 20:47    Procedures Procedures    Medications Ordered in ED Medications  sodium chloride (PF) 0.9 % injection (  Not Given 02/08/22 2228)  iohexol (OMNIPAQUE) 300 MG/ML solution 75 mL (80 mLs Intravenous Contrast Given 02/08/22 2030)    ED Course/ Medical Decision Making/ A&P                           Medical Decision Making  This patient presents to the ED for concern of right upper quadrant pain, this involves an extensive number of treatment options, and is a complaint that carries with it a high risk of complications and morbidity.  The differential diagnosis includes PE, cholecystitis, diverticulitis, pneumonia    Additional history obtained:  Additional history obtained from N/A External records from outside source obtained and reviewed including Oncology notes   Co  morbidities that complicate the patient evaluation  Diabetes  Social Determinants of Health:  N/A    Lab Tests:  I Ordered, and personally interpreted labs.  The pertinent results include: CBC unremarkable, CMP shows glucose of 117 BUN 27 creatinine 1.3, UA is unremarkable   Imaging Studies ordered:  I ordered imaging studies including CT AP I independently visualized and interpreted imaging which showed negative acute findings I agree with the radiologist interpretation   Cardiac Monitoring:  The patient was maintained on a cardiac monitor.  I personally viewed and interpreted the cardiac monitored which showed an underlying rhythm of: N/A   Medicines ordered and prescription drug management:  I ordered medication including N/A I have reviewed the patients home medicines and have made adjustments as needed  Critical Interventions:  N/A   Reevaluation:  She was obtain lab work imaging which I personally reviewed they are unremarkable, on my exam she she is having very minimal tenderness states that her pain has improved without any intervention she is agreement plan discharge at this time.  Consultations Obtained:  N/a    Test Considered:  N/a    Rule out I have low suspicion for ACS as history is atypical, there is no chest pain or shortness of breath.  Low suspicion for PE as patient denies pleuritic chest pain, shortness of breath, patient denies leg pain, no pedal edema noted on exam, vital signs reassuring nontachypneic nonhypoxic nontachycardic. low suspicion for AAA or aortic dissection as history is atypical, patient has low risk factors.  Low suspicion for UTI [place] or kidney stone denies any urinary symptoms, UA is negative for signs of infection or hematuria.  I have low suspicion for pancreatitis as lipase within normal limits.  I doubt choledocho or cholangitis as there is no ductal dilation no leukocytosis she is nontoxic-appearing.  I doubt  volvulus, diverticulitis, bowel obstruction, AAA CT imaging is all negative these findings.  I doubt lower lobe pneumonia no endorsing cough or congestion no urologic symptoms CT scan does not reveal any evidence of lower lobe pneumonia.   Dispostion and problem list  After consideration of the diagnostic results and the patients response to treatment, I feel that the patent would benefit from discharge.  Abdominal pain-since resolved I suspect secondary due to constipation as she endorses relief after bowel movements and states she has been constipated, however continue with laxatives and follow-up with her PCP for further evaluation strict return precautions.  Final Clinical Impression(s) / ED Diagnoses Final diagnoses:  Right upper quadrant abdominal pain    Rx / DC Orders ED Discharge Orders     None         Aron Baba 02/09/22 0102    Palumbo, April, MD 02/09/22 [phone]

## 2022-02-09 NOTE — ED Notes (Signed)
Pt dressed for discharge. Pt has access to home. Pt verbalized understanding of discharge instructions

## 2022-02-09 NOTE — Discharge Instructions (Signed)
Lab work imaging was reassuring, suspect some of your pain is from constipation, please continue with Dulcolax, you may also use MiraLAX remember these medications only work if you are hydrated please drink plenty of fluids it can take up to 3 to 4 days of consistent use before you see results,  recommend titrating until you have desired frequency and consistency.  Please follow your PCP as needed  Come back to the emergency department if you develop chest pain, shortness of breath, severe abdominal pain, uncontrolled nausea, vomiting, diarrhea.

## 2022-06-07 ENCOUNTER — Emergency Department (HOSPITAL_COMMUNITY): Payer: Medicare PPO

## 2022-06-07 ENCOUNTER — Other Ambulatory Visit: Payer: Self-pay

## 2022-06-07 ENCOUNTER — Encounter (HOSPITAL_COMMUNITY): Payer: Self-pay

## 2022-06-07 ENCOUNTER — Emergency Department (HOSPITAL_COMMUNITY)
Admission: EM | Admit: 2022-06-07 | Discharge: 2022-06-07 | Disposition: A | Discharge status: Home / Self Care | Payer: Medicare PPO | Attending: Emergency Medicine | Admitting: Emergency Medicine

## 2022-06-07 DIAGNOSIS — S4992XA Unspecified injury of left shoulder and upper arm, initial encounter: Secondary | ICD-10-CM | POA: Diagnosis present

## 2022-06-07 DIAGNOSIS — W19XXXA Unspecified fall, initial encounter: Secondary | ICD-10-CM

## 2022-06-07 DIAGNOSIS — S42255A Nondisplaced fracture of greater tuberosity of left humerus, initial encounter for closed fracture: Secondary | ICD-10-CM | POA: Insufficient documentation

## 2022-06-07 DIAGNOSIS — I10 Essential (primary) hypertension: Secondary | ICD-10-CM | POA: Diagnosis not present

## 2022-06-07 DIAGNOSIS — E119 Type 2 diabetes mellitus without complications: Secondary | ICD-10-CM | POA: Diagnosis not present

## 2022-06-07 DIAGNOSIS — Z7984 Long term (current) use of oral hypoglycemic drugs: Secondary | ICD-10-CM | POA: Insufficient documentation

## 2022-06-07 DIAGNOSIS — Z794 Long term (current) use of insulin: Secondary | ICD-10-CM | POA: Diagnosis not present

## 2022-06-07 DIAGNOSIS — Z79899 Other long term (current) drug therapy: Secondary | ICD-10-CM | POA: Insufficient documentation

## 2022-06-07 DIAGNOSIS — M25562 Pain in left knee: Secondary | ICD-10-CM | POA: Diagnosis not present

## 2022-06-07 DIAGNOSIS — W108XXA Fall (on) (from) other stairs and steps, initial encounter: Secondary | ICD-10-CM | POA: Diagnosis not present

## 2022-06-07 MED ORDER — ACETAMINOPHEN 500 MG PO TABS
1000.0000 mg | ORAL_TABLET | Freq: Once | ORAL | Status: AC
  Administered 2022-06-07: 1000 mg via ORAL
  Filled 2022-06-07: qty 2

## 2022-06-07 NOTE — ED Triage Notes (Signed)
C/o mechanical fall down 5 steps, pain to lower back and left shoulder radiating down arm. Decreased ROM noted.  Unknown if hit head. Denies LOC.  Denies blood thinner usage.

## 2022-06-07 NOTE — ED Provider Notes (Signed)
[place] EMERGENCY DEPARTMENT AT [facility] Provider Note   CSN: [id number] Arrival date & time: 06/07/22  [id number]     History  Chief Complaint  Patient presents with   Lytle Michaels    Kimberly Hunt is a 67 y.o. female.  Patient is a 67 year old female with a history of hypertension, diabetes, pancreatitis who is presenting today after falling down the stairs.  Kimberly Hunt reports that for a while now Kimberly Hunt has had pain in her back and left hip area that makes it difficult for her to sleep.  Kimberly Hunt had gotten up to go to the bathroom and had not realized that Kimberly Hunt was not completely steady and when Kimberly Hunt went to go down the steps Kimberly Hunt lost her balance and fell down 4 steps.  Kimberly Hunt landed on her left side and slid down the steps.  Kimberly Hunt thinks Kimberly Hunt may have possibly bumped her head but denies hitting her head hard or losing consciousness.  Her main complaint is of pain in her left arm and left hip.  Kimberly Hunt was able to ambulate after the fall.  Kimberly Hunt does not take any anticoagulants.  Kimberly Hunt denies any numbness or tingling in the hand or the leg.  The history is provided by the patient.  Fall       Home Medications Prior to Admission medications   Medication Sig Start Date End Date Taking? Authorizing Provider  acetaminophen (TYLENOL) 500 MG tablet Take 1 tablet (500 mg total) by mouth every 6 (six) hours as needed. Patient not taking: Reported on 02/04/2021 01/12/21   Everitt Amber, MD  amLODipine (NORVASC) 10 MG tablet Take 10 mg by mouth daily. 08/31/20   [provider]  aspirin-sod bicarb-citric acid (ALKA-SELTZER) 325 MG TBEF tablet Take 325 mg by mouth every 6 (six) hours as needed (indigestion/heart burn). Patient not taking: Reported on 02/04/2021    [provider]  blood glucose meter kit and supplies KIT Dispense based on patient and insurance preference. Use up to four times daily as directed. (FOR ICD-9 250.00, 250.01). 06/04/14   Bonnielee Haff, MD  citalopram (CELEXA) 40 MG  tablet Take 40 mg by mouth every evening.    [provider]  gabapentin (NEURONTIN) 300 MG capsule Take 300 mg by mouth at bedtime. 08/25/20   [provider]  ibuprofen (ADVIL) 800 MG tablet Take 1 tablet (800 mg total) by mouth every 8 (eight) hours as needed. Patient not taking: Reported on 02/04/2021 01/12/21   Everitt Amber, MD  insulin aspart (NOVOLOG FLEXPEN) 100 UNIT/ML FlexPen Inject 0-20 Units into the skin 3 (three) times daily with meals. as directed up to 10 units 09/04/19   [provider]  irbesartan-hydrochlorothiazide (AVALIDE) 150-12.5 MG tablet Take 1 tablet by mouth daily. 01/01/21   [provider]  LORazepam (ATIVAN) 0.5 MG tablet Take 0.5 mg by mouth 2 (two) times daily as needed for anxiety. 08/18/20   [provider]  metFORMIN (GLUCOPHAGE-XR) 500 MG 24 hr tablet Take 1,500 mg by mouth daily. 12/21/20   [provider]  TOUJEO MAX SOLOSTAR 300 UNIT/ML Solostar Pen Inject 45 Units into the skin daily. 11/20/20   [provider]  zolpidem (AMBIEN) 10 MG tablet Take 10 mg by mouth at bedtime as needed for sleep. Patient not taking: Reported on 02/04/2021 10/31/16   [provider]      Allergies    Shellfish allergy    Review of Systems   Review of Systems  Physical Exam Updated  Vital Signs BP (!) 224/93 (BP Location: Right Arm)   Pulse 96   Temp 98.1 F (36.7 C) (Oral)   Resp 19   Wt 79.4 kg   SpO2 100%   BMI 30.04 kg/m  Physical Exam Vitals and nursing note reviewed.  Constitutional:      General: Kimberly Hunt is not in acute distress.    Appearance: Kimberly Hunt is well-developed.  HENT:     Head: Normocephalic and atraumatic.  Eyes:     Pupils: Pupils are equal, round, and reactive to light.  Cardiovascular:     Rate and Rhythm: Normal rate and regular rhythm.     Pulses: Normal pulses.     Heart sounds: Normal heart sounds. No murmur heard.    No friction rub.  Pulmonary:     Effort: Pulmonary effort is  normal.     Breath sounds: Normal breath sounds. No wheezing or rales.  Abdominal:     General: Bowel sounds are normal. There is no distension.     Palpations: Abdomen is soft.     Tenderness: There is no abdominal tenderness. There is no guarding or rebound.  Musculoskeletal:        General: Tenderness present.     Left shoulder: Normal.     Left upper arm: Tenderness present.     Left elbow: Decreased range of motion. Tenderness present in olecranon process.     Cervical back: Normal range of motion and neck supple. No tenderness.     Right hip: Normal.     Left hip: Tenderness present. Normal range of motion.     Right knee: Normal.     Left knee: Normal.     Right ankle: Normal.     Left ankle: Normal.     Comments: No edema  Skin:    General: Skin is warm and dry.     Findings: No rash.  Neurological:     Mental Status: Kimberly Hunt is alert and oriented to person, place, and time. Mental status is at baseline.     Cranial Nerves: No cranial nerve deficit.     Sensory: No sensory deficit.     Motor: No weakness.  Psychiatric:        Behavior: Behavior normal.     ED Results / Procedures / Treatments   Labs (all labs ordered are listed, but only abnormal results are displayed) Labs Reviewed - No data to display  EKG None  Radiology DG Shoulder Left  Result Date: 06/07/2022 CLINICAL DATA:  Fall EXAM: LEFT SHOULDER - 2 VIEW COMPARISON:  None Available. FINDINGS: Subtle lucency extending through the area of the greater tuberosity of the proximal humerus, concerning for nondisplaced fracture. No evidence of arthropathy or other focal bone abnormality. Soft tissues are unremarkable. IMPRESSION: Possible nondisplaced greater tuberosity fracture of the left proximal humerus. Correlate point tenderness. Electronically Signed   By: Yetta Glassman M.D.   On: 06/07/2022 08:24   DG Elbow Complete Left  Result Date: 06/07/2022 CLINICAL DATA:  Pain after fall. EXAM: LEFT ELBOW -  COMPLETE 3+ VIEW COMPARISON:  None Available. FINDINGS: Osseous alignment is normal. No fracture line or displaced fracture fragment is seen. No evidence of joint effusion. Superficial soft tissues about the LEFT elbow are unremarkable. IMPRESSION: Negative. Electronically Signed   By: Franki Cabot M.D.   On: 06/07/2022 08:20   DG Hip Unilat W or Wo Pelvis 2-3 Views Left  Result Date: 06/07/2022 CLINICAL DATA:  Fall, pain. EXAM: DG HIP (WITH  OR WITHOUT PELVIS) 2-3V LEFT COMPARISON:  None Available. FINDINGS: Single-view of the pelvis and two views of the LEFT hip are provided. Osseous alignment appears normal. No fracture line or displaced fracture fragment is seen. Fairly extensive vascular calcifications. Visualized soft tissues about the pelvis and LEFT hip are otherwise unremarkable. IMPRESSION: 1. No acute findings. No osseous fracture or dislocation seen. 2. Fairly extensive vascular calcifications. Electronically Signed   By: Franki Cabot M.D.   On: 06/07/2022 08:19    Procedures Procedures    Medications Ordered in ED Medications  acetaminophen (TYLENOL) tablet 1,000 mg (1,000 mg Oral Given 06/07/22 [id number])    ED Course/ Medical Decision Making/ A&P                             Medical Decision Making Amount and/or Complexity of Data Reviewed Radiology: ordered and independent interpretation performed. Decision-making details documented in ED Course.  Risk OTC drugs.   Patient is a 67 year old female presenting today after falling down the steps.  Her main complaint is pain on the left side of her body.  Prior history of hip pain before this which most likely caused the fall.  Still complaining of pain in the left hip no evidence of trauma to the knee or ankle.  Also complaining of pain in the mid left arm.  No localized elbow pain except maybe over the olecranon but inability to flex at the elbow due to pain in the upper arm.  No deformities noted.  Concern for proper stretchable  humeral fracture or muscle/tendon injury.  Also will rule out hip fracture.  Patient was able to ambulate after the fall.  No significant head trauma or loss of consciousness.  Patient does not use any anticoagulants.  9:11 AM I have independently visualized and interpreted pt's images today.  Shoulder film today with concern for tuberosity fracture patient does have point tenderness in this location.  Elbow film and pelvic film are negative.  Findings discussed with the patient.  Kimberly Hunt was placed in a sling for comfort and given orthopedic follow-up.  Kimberly Hunt will use ibuprofen and Tylenol and Voltaren gel for pain control.  No social determinants today affecting her discharge         Final Clinical Impression(s) / ED Diagnoses Final diagnoses:  Fall, initial encounter  Closed nondisplaced fracture of greater tuberosity of left humerus, initial encounter    Rx / DC Orders ED Discharge Orders     None         Blanchie Dessert, MD 06/07/22 [phone]

## 2022-06-07 NOTE — Discharge Instructions (Signed)
Use 2 extra strength Tylenol and 2 ibuprofen together every 6 hours for pain control.  You can also use Voltaren gel.  Wear the sling for comfort but you can remove the sling to bathe.  No lifting with the left arm.  Make sure you are ranging your shoulder multiple times a day to prevent it from getting stiff.

## 2022-06-28 DIAGNOSIS — E860 Dehydration: Secondary | ICD-10-CM | POA: Diagnosis not present

## 2023-01-03 ENCOUNTER — Encounter: Payer: Self-pay | Admitting: Obstetrics and Gynecology

## 2023-01-19 DIAGNOSIS — E7800 Pure hypercholesterolemia, unspecified: Secondary | ICD-10-CM | POA: Diagnosis not present

## 2023-01-19 DIAGNOSIS — E1165 Type 2 diabetes mellitus with hyperglycemia: Secondary | ICD-10-CM | POA: Diagnosis not present

## 2023-01-19 DIAGNOSIS — Z794 Long term (current) use of insulin: Secondary | ICD-10-CM | POA: Diagnosis not present

## 2023-01-19 DIAGNOSIS — E1142 Type 2 diabetes mellitus with diabetic polyneuropathy: Secondary | ICD-10-CM | POA: Diagnosis not present

## 2023-01-19 DIAGNOSIS — F324 Major depressive disorder, single episode, in partial remission: Secondary | ICD-10-CM | POA: Diagnosis not present

## 2023-01-19 DIAGNOSIS — M549 Dorsalgia, unspecified: Secondary | ICD-10-CM | POA: Diagnosis not present

## 2023-01-19 DIAGNOSIS — I700 Atherosclerosis of aorta: Secondary | ICD-10-CM | POA: Diagnosis not present

## 2023-01-19 DIAGNOSIS — F5101 Primary insomnia: Secondary | ICD-10-CM | POA: Diagnosis not present

## 2023-01-19 DIAGNOSIS — I10 Essential (primary) hypertension: Secondary | ICD-10-CM | POA: Diagnosis not present

## 2023-02-01 ENCOUNTER — Inpatient Hospital Stay (HOSPITAL_COMMUNITY)
Admission: EM | Admit: 2023-02-01 | Discharge: 2023-02-06 | DRG: 291 | Disposition: A | Discharge status: Home / Self Care | Payer: Medicare PPO | Attending: Internal Medicine | Admitting: Internal Medicine

## 2023-02-01 ENCOUNTER — Emergency Department (HOSPITAL_COMMUNITY): Payer: Medicare PPO

## 2023-02-01 ENCOUNTER — Other Ambulatory Visit: Payer: Self-pay

## 2023-02-01 ENCOUNTER — Encounter (HOSPITAL_COMMUNITY): Payer: Self-pay

## 2023-02-01 DIAGNOSIS — C541 Malignant neoplasm of endometrium: Secondary | ICD-10-CM | POA: Diagnosis not present

## 2023-02-01 DIAGNOSIS — D649 Anemia, unspecified: Secondary | ICD-10-CM | POA: Diagnosis present

## 2023-02-01 DIAGNOSIS — N1831 Chronic kidney disease, stage 3a: Secondary | ICD-10-CM | POA: Diagnosis not present

## 2023-02-01 DIAGNOSIS — E1165 Type 2 diabetes mellitus with hyperglycemia: Secondary | ICD-10-CM | POA: Diagnosis not present

## 2023-02-01 DIAGNOSIS — I130 Hypertensive heart and chronic kidney disease with heart failure and stage 1 through stage 4 chronic kidney disease, or unspecified chronic kidney disease: Principal | ICD-10-CM | POA: Diagnosis present

## 2023-02-01 DIAGNOSIS — Z79899 Other long term (current) drug therapy: Secondary | ICD-10-CM

## 2023-02-01 DIAGNOSIS — Z8542 Personal history of malignant neoplasm of other parts of uterus: Secondary | ICD-10-CM

## 2023-02-01 DIAGNOSIS — I509 Heart failure, unspecified: Secondary | ICD-10-CM | POA: Diagnosis not present

## 2023-02-01 DIAGNOSIS — I5021 Acute systolic (congestive) heart failure: Secondary | ICD-10-CM | POA: Diagnosis not present

## 2023-02-01 DIAGNOSIS — E538 Deficiency of other specified B group vitamins: Secondary | ICD-10-CM | POA: Diagnosis present

## 2023-02-01 DIAGNOSIS — E875 Hyperkalemia: Secondary | ICD-10-CM | POA: Diagnosis present

## 2023-02-01 DIAGNOSIS — Z6834 Body mass index (BMI) 34.0-34.9, adult: Secondary | ICD-10-CM

## 2023-02-01 DIAGNOSIS — F32A Depression, unspecified: Secondary | ICD-10-CM | POA: Diagnosis present

## 2023-02-01 DIAGNOSIS — Z794 Long term (current) use of insulin: Secondary | ICD-10-CM | POA: Diagnosis not present

## 2023-02-01 DIAGNOSIS — Z87891 Personal history of nicotine dependence: Secondary | ICD-10-CM

## 2023-02-01 DIAGNOSIS — Z91013 Allergy to seafood: Secondary | ICD-10-CM | POA: Diagnosis not present

## 2023-02-01 DIAGNOSIS — Z809 Family history of malignant neoplasm, unspecified: Secondary | ICD-10-CM

## 2023-02-01 DIAGNOSIS — I110 Hypertensive heart disease with heart failure: Secondary | ICD-10-CM | POA: Diagnosis not present

## 2023-02-01 DIAGNOSIS — Z803 Family history of malignant neoplasm of breast: Secondary | ICD-10-CM

## 2023-02-01 DIAGNOSIS — E669 Obesity, unspecified: Secondary | ICD-10-CM | POA: Diagnosis not present

## 2023-02-01 DIAGNOSIS — N189 Chronic kidney disease, unspecified: Secondary | ICD-10-CM | POA: Diagnosis present

## 2023-02-01 DIAGNOSIS — J811 Chronic pulmonary edema: Secondary | ICD-10-CM | POA: Diagnosis not present

## 2023-02-01 DIAGNOSIS — Z90710 Acquired absence of both cervix and uterus: Secondary | ICD-10-CM

## 2023-02-01 DIAGNOSIS — E1122 Type 2 diabetes mellitus with diabetic chronic kidney disease: Secondary | ICD-10-CM | POA: Diagnosis present

## 2023-02-01 DIAGNOSIS — N1832 Chronic kidney disease, stage 3b: Secondary | ICD-10-CM | POA: Diagnosis present

## 2023-02-01 DIAGNOSIS — I5020 Unspecified systolic (congestive) heart failure: Secondary | ICD-10-CM | POA: Diagnosis not present

## 2023-02-01 DIAGNOSIS — N179 Acute kidney failure, unspecified: Secondary | ICD-10-CM | POA: Diagnosis present

## 2023-02-01 DIAGNOSIS — K219 Gastro-esophageal reflux disease without esophagitis: Secondary | ICD-10-CM | POA: Diagnosis present

## 2023-02-01 DIAGNOSIS — Z7984 Long term (current) use of oral hypoglycemic drugs: Secondary | ICD-10-CM

## 2023-02-01 DIAGNOSIS — D509 Iron deficiency anemia, unspecified: Secondary | ICD-10-CM

## 2023-02-01 DIAGNOSIS — I5032 Chronic diastolic (congestive) heart failure: Secondary | ICD-10-CM

## 2023-02-01 DIAGNOSIS — K5909 Other constipation: Secondary | ICD-10-CM | POA: Diagnosis present

## 2023-02-01 DIAGNOSIS — I5031 Acute diastolic (congestive) heart failure: Secondary | ICD-10-CM | POA: Diagnosis present

## 2023-02-01 DIAGNOSIS — T501X5A Adverse effect of loop [high-ceiling] diuretics, initial encounter: Secondary | ICD-10-CM | POA: Diagnosis present

## 2023-02-01 DIAGNOSIS — R0789 Other chest pain: Secondary | ICD-10-CM | POA: Diagnosis not present

## 2023-02-01 DIAGNOSIS — R0602 Shortness of breath: Secondary | ICD-10-CM | POA: Diagnosis not present

## 2023-02-01 DIAGNOSIS — J90 Pleural effusion, not elsewhere classified: Secondary | ICD-10-CM | POA: Diagnosis not present

## 2023-02-01 DIAGNOSIS — J9601 Acute respiratory failure with hypoxia: Secondary | ICD-10-CM | POA: Diagnosis not present

## 2023-02-01 DIAGNOSIS — J810 Acute pulmonary edema: Secondary | ICD-10-CM | POA: Diagnosis not present

## 2023-02-01 LAB — COMPREHENSIVE METABOLIC PANEL
ALT: 26 U/L (ref 0–44)
AST: 28 U/L (ref 15–41)
Albumin: 3.6 g/dL (ref 3.5–5.0)
Alkaline Phosphatase: 68 U/L (ref 38–126)
Anion gap: 8 (ref 5–15)
BUN: 45 mg/dL — ABNORMAL HIGH (ref 8–23)
CO2: 22 mmol/L (ref 22–32)
Calcium: 8.3 mg/dL — ABNORMAL LOW (ref 8.9–10.3)
Chloride: 104 mmol/L (ref 98–111)
Creatinine, Ser: 1.88 mg/dL — ABNORMAL HIGH (ref 0.44–1.00)
GFR, Estimated: 29 mL/min — ABNORMAL LOW (ref >60)
Glucose, Bld: 110 mg/dL — ABNORMAL HIGH (ref 70–99)
Potassium: 5.5 mmol/L — ABNORMAL HIGH (ref 3.5–5.1)
Sodium: 134 mmol/L — ABNORMAL LOW (ref 135–145)
Total Bilirubin: 1 mg/dL (ref 0.3–1.2)
Total Protein: 7.4 g/dL (ref 6.5–8.1)

## 2023-02-01 LAB — CBC WITH DIFFERENTIAL/PLATELET
Abs Immature Granulocytes: 0.11 10*3/uL — ABNORMAL HIGH (ref 0.00–0.07)
Basophils Absolute: 0.1 10*3/uL (ref 0.0–0.1)
Basophils Relative: 0 %
Eosinophils Absolute: 0 10*3/uL (ref 0.0–0.5)
Eosinophils Relative: 0 %
HCT: 26.5 % — ABNORMAL LOW (ref 36.0–46.0)
Hemoglobin: 8.7 g/dL — ABNORMAL LOW (ref 12.0–15.0)
Immature Granulocytes: 1 %
Lymphocytes Relative: 9 %
Lymphs Abs: 1.4 10*3/uL (ref 0.7–4.0)
MCH: 30.4 pg (ref 26.0–34.0)
MCHC: 32.8 g/dL (ref 30.0–36.0)
MCV: 92.7 fL (ref 80.0–100.0)
Monocytes Absolute: 1.1 10*3/uL — ABNORMAL HIGH (ref 0.1–1.0)
Monocytes Relative: 7 %
Neutro Abs: 13.4 10*3/uL — ABNORMAL HIGH (ref 1.7–7.7)
Neutrophils Relative %: 83 %
Platelets: 339 10*3/uL (ref 150–400)
RBC: 2.86 MIL/uL — ABNORMAL LOW (ref 3.87–5.11)
RDW: 13 % (ref 11.5–15.5)
WBC: 16.2 10*3/uL — ABNORMAL HIGH (ref 4.0–10.5)
nRBC: 0 % (ref 0.0–0.2)

## 2023-02-01 LAB — GLUCOSE, CAPILLARY
Glucose-Capillary: 90 mg/dL (ref 70–99)
Glucose-Capillary: 117 mg/dL — ABNORMAL HIGH (ref 70–99)
Glucose-Capillary: 168 mg/dL — ABNORMAL HIGH (ref 70–99)

## 2023-02-01 LAB — TROPONIN I (HIGH SENSITIVITY)
Troponin I (High Sensitivity): 8 ng/L (ref <18)
Troponin I (High Sensitivity): 10 ng/L (ref <18)

## 2023-02-01 LAB — POTASSIUM: Potassium: 5.3 mmol/L — ABNORMAL HIGH (ref 3.5–5.1)

## 2023-02-01 LAB — HIV ANTIBODY (ROUTINE TESTING W REFLEX): HIV Screen 4th Generation wRfx: NONREACTIVE

## 2023-02-01 LAB — BRAIN NATRIURETIC PEPTIDE: B Natriuretic Peptide: 661.1 pg/mL — ABNORMAL HIGH (ref 0.0–100.0)

## 2023-02-01 LAB — HEMOGLOBIN A1C
Hgb A1c MFr Bld: 6 % — ABNORMAL HIGH (ref 4.8–5.6)
Mean Plasma Glucose: 125.5 mg/dL

## 2023-02-01 MED ORDER — FUROSEMIDE 10 MG/ML IJ SOLN
40.0000 mg | Freq: Once | INTRAMUSCULAR | Status: AC
  Administered 2023-02-01: 40 mg via INTRAVENOUS
  Filled 2023-02-01: qty 4

## 2023-02-01 MED ORDER — TRAZODONE HCL 50 MG PO TABS
25.0000 mg | ORAL_TABLET | Freq: Every evening | ORAL | Status: DC | PRN
  Administered 2023-02-03 – 2023-02-04 (×2): 25 mg via ORAL
  Filled 2023-02-01 (×2): qty 1

## 2023-02-01 MED ORDER — ACETAMINOPHEN 650 MG RE SUPP: 650.0000 mg | Freq: Four times a day (QID) | RECTAL | Status: DC | PRN

## 2023-02-01 MED ORDER — ALBUTEROL SULFATE (2.5 MG/3ML) 0.083% IN NEBU: 2.5000 mg | INHALATION_SOLUTION | RESPIRATORY_TRACT | Status: DC | PRN

## 2023-02-01 MED ORDER — FUROSEMIDE 10 MG/ML IJ SOLN
40.0000 mg | Freq: Two times a day (BID) | INTRAMUSCULAR | Status: DC
  Administered 2023-02-01: 40 mg via INTRAVENOUS
  Filled 2023-02-01: qty 4

## 2023-02-01 MED ORDER — AMLODIPINE BESYLATE 10 MG PO TABS
10.0000 mg | ORAL_TABLET | Freq: Every day | ORAL | Status: DC
  Administered 2023-02-01 – 2023-02-06 (×6): 10 mg via ORAL
  Filled 2023-02-01 (×3): qty 1
  Filled 2023-02-01: qty 2
  Filled 2023-02-01 (×2): qty 1

## 2023-02-01 MED ORDER — ONDANSETRON HCL 4 MG/2ML IJ SOLN: 4.0000 mg | Freq: Four times a day (QID) | INTRAMUSCULAR | Status: DC | PRN

## 2023-02-01 MED ORDER — CARVEDILOL 6.25 MG PO TABS
6.2500 mg | ORAL_TABLET | Freq: Two times a day (BID) | ORAL | Status: DC
  Administered 2023-02-01 – 2023-02-06 (×9): 6.25 mg via ORAL
  Filled 2023-02-01 (×10): qty 1

## 2023-02-01 MED ORDER — ENOXAPARIN SODIUM 30 MG/0.3ML IJ SOSY
30.0000 mg | PREFILLED_SYRINGE | INTRAMUSCULAR | Status: DC
  Administered 2023-02-02 – 2023-02-03 (×2): 30 mg via SUBCUTANEOUS
  Filled 2023-02-01 (×2): qty 0.3

## 2023-02-01 MED ORDER — ONDANSETRON HCL 4 MG PO TABS: 4.0000 mg | ORAL_TABLET | Freq: Four times a day (QID) | ORAL | Status: DC | PRN

## 2023-02-01 MED ORDER — ORAL CARE MOUTH RINSE: 15.0000 mL | OROMUCOSAL | Status: DC | PRN

## 2023-02-01 MED ORDER — ACETAMINOPHEN 325 MG PO TABS
650.0000 mg | ORAL_TABLET | Freq: Four times a day (QID) | ORAL | Status: DC | PRN
  Administered 2023-02-04: 650 mg via ORAL
  Filled 2023-02-01: qty 2

## 2023-02-01 MED ORDER — ENOXAPARIN SODIUM 40 MG/0.4ML IJ SOSY
40.0000 mg | PREFILLED_SYRINGE | INTRAMUSCULAR | Status: DC
  Administered 2023-02-01: 40 mg via SUBCUTANEOUS
  Filled 2023-02-01: qty 0.4

## 2023-02-01 MED ORDER — INSULIN ASPART 100 UNIT/ML IJ SOLN
0.0000 [IU] | Freq: Three times a day (TID) | INTRAMUSCULAR | Status: DC
  Administered 2023-02-02 – 2023-02-03 (×2): 3 [IU] via SUBCUTANEOUS
  Administered 2023-02-03 – 2023-02-04 (×2): 2 [IU] via SUBCUTANEOUS
  Administered 2023-02-04 – 2023-02-05 (×3): 3 [IU] via SUBCUTANEOUS
  Administered 2023-02-05: 8 [IU] via SUBCUTANEOUS
  Administered 2023-02-05: 3 [IU] via SUBCUTANEOUS
  Administered 2023-02-06: 2 [IU] via SUBCUTANEOUS
  Filled 2023-02-01: qty 0.15

## 2023-02-01 MED ORDER — HYDRALAZINE HCL 20 MG/ML IJ SOLN
5.0000 mg | Freq: Four times a day (QID) | INTRAMUSCULAR | Status: DC | PRN
  Administered 2023-02-03 – 2023-02-05 (×3): 5 mg via INTRAVENOUS
  Filled 2023-02-01 (×4): qty 1

## 2023-02-01 MED ORDER — ALBUTEROL SULFATE HFA 108 (90 BASE) MCG/ACT IN AERS: 2.0000 | INHALATION_SPRAY | RESPIRATORY_TRACT | Status: DC | PRN

## 2023-02-01 MED ORDER — INSULIN ASPART 100 UNIT/ML IJ SOLN
0.0000 [IU] | Freq: Every day | INTRAMUSCULAR | Status: DC
  Administered 2023-02-03: 3 [IU] via SUBCUTANEOUS
  Filled 2023-02-01: qty 0.05

## 2023-02-01 NOTE — ED Provider Notes (Addendum)
[place] EMERGENCY DEPARTMENT AT [facility] Provider Note   CSN: [id number] Arrival date & time: 02/01/23  8413     History  Chief Complaint  Patient presents with   Shortness of Breath         Kimberly Hunt is a 67 y.o. female.  Patient has a history of diabetes and hypertension.  Patient states she started getting short of breath couple days ago and it was worse when she was lying down.  Patient had a room air sat of 83% when she arrived in the emergency department  The history is provided by the patient and medical records. No language interpreter was used.  Shortness of Breath Severity:  Moderate Onset quality:  Sudden Timing:  Constant Progression:  Worsening Chronicity:  New Context: activity   Relieved by:  Nothing Worsened by:  Nothing Ineffective treatments:  None tried Associated symptoms: no abdominal pain, no chest pain, no cough, no headaches and no rash   Risk factors: no recent alcohol use        Home Medications Prior to Admission medications   Medication Sig Start Date End Date Taking? Authorizing Provider  acetaminophen (TYLENOL) 500 MG tablet Take 1 tablet (500 mg total) by mouth every 6 (six) hours as needed. Patient not taking: Reported on 02/04/2021 01/12/21   Adolphus Birchwood, MD  amLODipine (NORVASC) 10 MG tablet Take 10 mg by mouth daily. 08/31/20   [provider]  aspirin-sod bicarb-citric acid (ALKA-SELTZER) 325 MG TBEF tablet Take 325 mg by mouth every 6 (six) hours as needed (indigestion/heart burn). Patient not taking: Reported on 02/04/2021    [provider]  blood glucose meter kit and supplies KIT Dispense based on patient and insurance preference. Use up to four times daily as directed. (FOR ICD-9 250.00, 250.01). 06/04/14   Osvaldo Shipper, MD  citalopram (CELEXA) 40 MG tablet Take 40 mg by mouth every evening.    [provider]  gabapentin (NEURONTIN) 300 MG capsule Take 300 mg by mouth at  bedtime. 08/25/20   [provider]  ibuprofen (ADVIL) 800 MG tablet Take 1 tablet (800 mg total) by mouth every 8 (eight) hours as needed. Patient not taking: Reported on 02/04/2021 01/12/21   Adolphus Birchwood, MD  insulin aspart (NOVOLOG FLEXPEN) 100 UNIT/ML FlexPen Inject 0-20 Units into the skin 3 (three) times daily with meals. as directed up to 10 units 09/04/19   [provider]  irbesartan-hydrochlorothiazide (AVALIDE) 150-12.5 MG tablet Take 1 tablet by mouth daily. 01/01/21   [provider]  LORazepam (ATIVAN) 0.5 MG tablet Take 0.5 mg by mouth 2 (two) times daily as needed for anxiety. 08/18/20   [provider]  metFORMIN (GLUCOPHAGE-XR) 500 MG 24 hr tablet Take 1,500 mg by mouth daily. 12/21/20   [provider]  TOUJEO MAX SOLOSTAR 300 UNIT/ML Solostar Pen Inject 45 Units into the skin daily. 11/20/20   [provider]  zolpidem (AMBIEN) 10 MG tablet Take 10 mg by mouth at bedtime as needed for sleep. Patient not taking: Reported on 02/04/2021 10/31/16   [provider]      Allergies    Shellfish allergy    Review of Systems   Review of Systems  Constitutional:  Negative for appetite change and fatigue.  HENT:  Negative for congestion, ear discharge and sinus pressure.   Eyes:  Negative for discharge.  Respiratory:  Positive for shortness of breath. Negative for cough.   Cardiovascular:  Negative for chest  pain.  Gastrointestinal:  Negative for abdominal pain and diarrhea.  Genitourinary:  Negative for frequency and hematuria.  Musculoskeletal:  Negative for back pain.  Skin:  Negative for rash.  Neurological:  Negative for seizures and headaches.  Psychiatric/Behavioral:  Negative for hallucinations.     Physical Exam Updated Vital Signs BP (!) 152/66   Pulse 63   Temp 99.2 F (37.3 C) (Oral)   Resp 20   Ht 5' (1.524 m)   Wt 79.4 kg   SpO2 91%   BMI 34.18 kg/m  Physical Exam  ED Results / Procedures /  Treatments   Labs (all labs ordered are listed, but only abnormal results are displayed) Labs Reviewed  BRAIN NATRIURETIC PEPTIDE - Abnormal; Notable for the following components:      Result Value   B Natriuretic Peptide 661.1 (*)    All other components within normal limits  CBC WITH DIFFERENTIAL/PLATELET - Abnormal; Notable for the following components:   WBC 16.2 (*)    RBC 2.86 (*)    Hemoglobin 8.7 (*)    HCT 26.5 (*)    Neutro Abs 13.4 (*)    Monocytes Absolute 1.1 (*)    Abs Immature Granulocytes 0.11 (*)    All other components within normal limits  COMPREHENSIVE METABOLIC PANEL - Abnormal; Notable for the following components:   Sodium 134 (*)    Potassium 5.5 (*)    Glucose, Bld 110 (*)    BUN 45 (*)    Creatinine, Ser 1.88 (*)    Calcium 8.3 (*)    GFR, Estimated 29 (*)    All other components within normal limits  HIV ANTIBODY (ROUTINE TESTING W REFLEX)  HEMOGLOBIN A1C  TROPONIN I (HIGH SENSITIVITY)  TROPONIN I (HIGH SENSITIVITY)    EKG EKG Interpretation Date/Time:  Wednesday February 01 2023 08:21:45 EDT Ventricular Rate:  63 PR Interval:  150 QRS Duration:  78 QT Interval:  435 QTC Calculation: 446 R Axis:   63  Text Interpretation: Sinus rhythm Confirmed by Bethann Berkshire [phone]) on 02/01/2023 8:44:25 AM  Radiology DG Chest 2 View  Result Date: 02/01/2023 CLINICAL DATA:  Chest tightness and shortness of breath EXAM: CHEST - 2 VIEW COMPARISON:  Chest radiograph dated 07/30/2014 FINDINGS: Normal lung volumes. Bilateral interstitial opacities. Trace bilateral pleural effusions. No pneumothorax. The heart size and mediastinal contours are within normal limits. No acute osseous abnormality. IMPRESSION: Pulmonary edema and trace bilateral pleural effusions. Electronically Signed   By: Agustin Cree M.D.   On: 02/01/2023 09:27    Procedures Procedures    Medications Ordered in ED Medications  albuterol (VENTOLIN HFA) 108 (90 Base) MCG/ACT inhaler 2 puff (has  no administration in time range)  amLODipine (NORVASC) tablet 10 mg (has no administration in time range)  enoxaparin (LOVENOX) injection 40 mg (has no administration in time range)  insulin aspart (novoLOG) injection 0-15 Units (has no administration in time range)  insulin aspart (novoLOG) injection 0-5 Units (has no administration in time range)  acetaminophen (TYLENOL) tablet 650 mg (has no administration in time range)    Or  acetaminophen (TYLENOL) suppository 650 mg (has no administration in time range)  traZODone (DESYREL) tablet 25 mg (has no administration in time range)  ondansetron (ZOFRAN) tablet 4 mg (has no administration in time range)    Or  ondansetron (ZOFRAN) injection 4 mg (has no administration in time range)  albuterol (PROVENTIL) (2.5 MG/3ML) 0.083% nebulizer solution 2.5 mg (has no administration in time range)  hydrALAZINE (APRESOLINE) injection 5 mg (has no administration in time range)  carvedilol (COREG) tablet 6.25 mg (has no administration in time range)  furosemide (LASIX) injection 40 mg (has no administration in time range)  furosemide (LASIX) injection 40 mg (40 mg Intravenous Given 02/01/23 0948)    ED Course/ Medical Decision Making/ A&P                                 Medical Decision Making Amount and/or Complexity of Data Reviewed Labs: ordered. Radiology: ordered.  Risk Prescription drug management. Decision regarding hospitalization.  This patient presents to the ED for concern of patient complains of shortness of breath., this involves an extensive number of treatment options, and is a complaint that carries with it a high risk of complications and morbidity.  The differential diagnosis includes heart failure, COPD, pneumonia   Co morbidities that complicate the patient evaluation  Hypertension diabetes   Additional history obtained:  Additional history obtained from patient External records from outside source obtained and reviewed  including hospital records   Lab Tests:  I Ordered, and personally interpreted labs.  The pertinent results include: BNP 661   Imaging Studies ordered:  I ordered imaging studies chest x-ray I independently visualized and interpreted imaging which showed pulmonary edema and pleural effusions I agree with the radiologist interpretation   Cardiac Monitoring: / EKG:  The patient was maintained on a cardiac monitor.  I personally viewed and interpreted the cardiac monitored which showed an underlying rhythm of: Normal sinus rhythm   Consultations Obtained:  I requested consultation with the hospitalist,  and discussed lab and imaging findings as well as pertinent plan - they recommend: Admit   Problem List / ED Course / Critical interventions / Medication management  Shortness of breath, hypertension, diabetes I ordered medication including Lasix Reevaluation of the patient after these medicines showed that the patient improved I have reviewed the patients home medicines and have made adjustments as needed   Social Determinants of Health:  None   Test / Admission - Considered:  None  Patient with new onset congestive heart failure and anemia.  She is given some Lasix and will be admitted to medicine        Final Clinical Impression(s) / ED Diagnoses Final diagnoses:  Systolic congestive heart failure, unspecified HF chronicity [facility])    Rx / DC Orders ED Discharge Orders     None         Bethann Berkshire, MD 02/01/23 1042    Bethann Berkshire, MD 02/04/23 1150

## 2023-02-01 NOTE — H&P (Signed)
History and Physical  Kimberly Hunt IRJ:[id number] DOB: May 30, 1955 DOA: 02/01/2023  PCP: Kimberly Retort, MD   Chief Complaint: Shortness of breath  HPI: Kimberly Hunt is a 67 y.o. female with medical history significant for hypertension, poorly controlled diabetes, endometrial cancer status post hysterectomy being admitted to the hospital with 1 day of nonproductive cough, shortness of breath and concern for new onset acute congestive heart failure.  Patient states that she was in her usual state of health until about 24 hours ago, when she started to notice some dyspnea with exertion, denies any lower extremity edema, chest pain, palpitations, productive cough or fevers.  Last evening, she was having a hard time lying flat, felt short of breath and some chest pressure when she tried to lie flat.  She started to develop a cough, which was nonproductive.  Denies any fevers or chills, she also does endorse sensation last night of abdominal fullness.  ED Course: In the emergency department, she was noted to be saturating 83% on room air.  She was placed on 2 L nasal cannula oxygen.  Blood pressure 152/66 but otherwise unremarkable.  Lab work significant for normal troponin, WBC 16, hemoglobin 8.7, normal platelets, BNP 661, creatinine 1.88, potassium 5.5, sodium 134.  Baseline creatinine is normal, when last checked in 2022.  Review of Systems: Please see HPI for pertinent positives and negatives. A complete 10 system review of systems are otherwise negative.  Past Medical History:  Diagnosis Date   Anxiety    Arthritis    Depression    Diabetes mellitus without complication (HCC)    Family history of breast cancer 02/08/2021   Family history of prostate cancer 02/08/2021   Heart murmur    no problems mild told in her 30's   Hypertension    Neuromuscular disorder (HCC)    neuropathy feet   Pancreatitis    Past Surgical History:  Procedure Laterality Date   CERVICAL POLYPECTOMY      CHOLECYSTECTOMY     COLONOSCOPY W/ POLYPECTOMY     ROBOTIC ASSISTED TOTAL HYSTERECTOMY WITH BILATERAL SALPINGO OOPHERECTOMY N/A 01/12/2021   Procedure: XI ROBOTIC ASSISTED TOTAL HYSTERECTOMY WITH BILATERAL SALPINGO OOPHORECTOMY;  Surgeon: Kimberly Birchwood, MD;  Location: [facility];  Service: Gynecology;  Laterality: N/A;   SENTINEL NODE BIOPSY N/A 01/12/2021   Procedure: SENTINEL LYMPH NODE BIOPSY;  Surgeon: Kimberly Birchwood, MD;  Location: [facility];  Service: Gynecology;  Laterality: N/A;   TONSILLECTOMY     UTERINE FIBROID SURGERY      Social History:  reports that she quit smoking about 44 years ago. Her smoking use included cigarettes. She started smoking about 54 years ago. She has never used smokeless tobacco. She reports that she does not drink alcohol and does not use drugs.   Allergies  Allergen Reactions   Shellfish Allergy Hives    Hives    Family History  Problem Relation Age of Onset   Breast cancer Mother        dx after 94   Cancer Maternal Aunt        unknown type; dx after 50   Cancer Maternal Uncle        unknown type; dx after 54, x2 mat uncles   Cancer Paternal Aunt        unknown type; dx after 21   Prostate cancer Paternal Uncle        dx after 50; mets     Prior to Admission medications   Medication  Sig Start Date End Date Taking? Authorizing Provider  acetaminophen (TYLENOL) 500 MG tablet Take 1 tablet (500 mg total) by mouth every 6 (six) hours as needed. Patient not taking: Reported on 02/04/2021 01/12/21   Kimberly Birchwood, MD  amLODipine (NORVASC) 10 MG tablet Take 10 mg by mouth daily. 08/31/20   [provider]  aspirin-sod bicarb-citric acid (ALKA-SELTZER) 325 MG TBEF tablet Take 325 mg by mouth every 6 (six) hours as needed (indigestion/heart burn). Patient not taking: Reported on 02/04/2021    [provider]  blood glucose meter kit and supplies KIT Dispense based on patient and insurance preference. Use up to four times daily as directed. (FOR  ICD-9 250.00, 250.01). 06/04/14   Osvaldo Shipper, MD  citalopram (CELEXA) 40 MG tablet Take 40 mg by mouth every evening.    [provider]  gabapentin (NEURONTIN) 300 MG capsule Take 300 mg by mouth at bedtime. 08/25/20   [provider]  ibuprofen (ADVIL) 800 MG tablet Take 1 tablet (800 mg total) by mouth every 8 (eight) hours as needed. Patient not taking: Reported on 02/04/2021 01/12/21   Kimberly Birchwood, MD  insulin aspart (NOVOLOG FLEXPEN) 100 UNIT/ML FlexPen Inject 0-20 Units into the skin 3 (three) times daily with meals. as directed up to 10 units 09/04/19   [provider]  irbesartan-hydrochlorothiazide (AVALIDE) 150-12.5 MG tablet Take 1 tablet by mouth daily. 01/01/21   [provider]  LORazepam (ATIVAN) 0.5 MG tablet Take 0.5 mg by mouth 2 (two) times daily as needed for anxiety. 08/18/20   [provider]  metFORMIN (GLUCOPHAGE-XR) 500 MG 24 hr tablet Take 1,500 mg by mouth daily. 12/21/20   [provider]  TOUJEO MAX SOLOSTAR 300 UNIT/ML Solostar Pen Inject 45 Units into the skin daily. 11/20/20   [provider]  zolpidem (AMBIEN) 10 MG tablet Take 10 mg by mouth at bedtime as needed for sleep. Patient not taking: Reported on 02/04/2021 10/31/16   [provider]    Physical Exam: BP (!) 152/66   Pulse 63   Temp 99.2 F (37.3 C) (Oral)   Resp 20   Ht 5' (1.524 m)   Wt 79.4 kg   SpO2 91%   BMI 34.18 kg/m   General:  Alert, oriented, calm, in no acute distress resting comfortably and speaking in full sentences on 2 L nasal cannula oxygen Eyes: EOMI, clear conjuctivae, white sclerea Neck: supple, no masses, trachea mildline  Cardiovascular: RRR, no murmurs or rubs, no peripheral edema  Respiratory: clear to auscultation bilaterally, with good bilateral equal air entry, no wheezing, she does have bilateral basilar crackles Abdomen: soft, nontender, nondistended, normal bowel tones heard  Skin: dry, no rashes   Musculoskeletal: no joint effusions, normal range of motion  Psychiatric: appropriate affect, normal speech  Neurologic: extraocular muscles intact, clear speech, moving all extremities with intact sensorium         Labs on Admission:  Basic Metabolic Panel: Recent Labs  Lab 02/01/23 0846  NA 134*  K 5.5*  CL 104  CO2 22  GLUCOSE 110*  BUN 45*  CREATININE 1.88*  CALCIUM 8.3*   Liver Function Tests: Recent Labs  Lab 02/01/23 0846  AST 28  ALT 26  ALKPHOS 68  BILITOT 1.0  PROT 7.4  ALBUMIN 3.6   No results for input(s): "LIPASE", "AMYLASE" in the last 168 hours. No results for input(s): "AMMONIA" in the last 168 hours. CBC: Recent Labs  Lab 02/01/23 0846  WBC 16.2*  NEUTROABS 13.4*  HGB 8.7*  HCT 26.5*  MCV 92.7  PLT 339   Cardiac Enzymes: No results for input(s): "CKTOTAL", "CKMB", "CKMBINDEX", "TROPONINI" in the last 168 hours.  BNP (last 3 results) Recent Labs    02/01/23 0846  BNP 661.1*    ProBNP (last 3 results) No results for input(s): "PROBNP" in the last 8760 hours.  CBG: No results for input(s): "GLUCAP" in the last 168 hours.  Radiological Exams on Admission: DG Chest 2 View  Result Date: 02/01/2023 CLINICAL DATA:  Chest tightness and shortness of breath EXAM: CHEST - 2 VIEW COMPARISON:  Chest radiograph dated 07/30/2014 FINDINGS: Normal lung volumes. Bilateral interstitial opacities. Trace bilateral pleural effusions. No pneumothorax. The heart size and mediastinal contours are within normal limits. No acute osseous abnormality. IMPRESSION: Pulmonary edema and trace bilateral pleural effusions. Electronically Signed   By: Agustin Cree M.D.   On: 02/01/2023 09:27    Assessment/Plan This is a pleasant 67 year old female with a history of obesity, hypertension, poorly controlled type 2 diabetes being admitted to the hospital with orthopnea, dyspnea with exertion, elevated BNP and pulmonary edema on chest x-ray concerning for acute heart failure  with preserved EF.  Suspected acute heart failure-this would be a new diagnosis.  She looks mildly fluid overloaded on exam, has pulmonary edema and elevated BNP. -Inpatient admission to telemetry -Start low-dose Coreg -IV Lasix 40 mg twice daily -Heart healthy diet with 2 L fluid restriction -Strict I's and O's -Check 2D echo -Consider inpatient cardiology consultation pending the above  Renal insufficiency-unclear baseline, though she had normal renal function in 2022 -Avoid nephrotoxins -Treat heart failure as above -Trend renal function with daily labs  Acute hypoxic respiratory failure-due to pulmonary edema in the setting of suspected acute heart failure, no evidence of acute infection, and PE incredibly unlikely given lack of chest pain, tachycardia, or risk factors. -Continue supplemental oxygen and wean as tolerated  Hyperkalemia-mild, without significant EKG changes, and likely due to renal insufficiency.  Anticipate her hyperkalemia will improve with diuresis. -Recheck potassium level this afternoon  Type 2 diabetes-previously poorly controlled, last hemoglobin A1c 9.8 in 2022 -Update A1c -Carb controlled diet -Moderate sliding scale  Obesity-BMI 34.18, complicating all aspects of care  DVT prophylaxis: Lovenox     Code Status: Full Code  Consults called: None  Admission status: The appropriate patient status for this patient is INPATIENT. Inpatient status is judged to be reasonable and necessary in order to provide the required intensity of service to ensure the patient's safety. The patient's presenting symptoms, physical exam findings, and initial radiographic and laboratory data in the context of their chronic comorbidities is felt to place them at high risk for further clinical deterioration. Furthermore, it is not anticipated that the patient will be medically stable for discharge from the hospital within 2 midnights of admission.    I certify that at the point  of admission it is my clinical judgment that the patient will require inpatient hospital care spanning beyond 2 midnights from the point of admission due to high intensity of service, high risk for further deterioration and high frequency of surveillance required  Time spent: 58 minutes  Bethanne Mule Sharlette Dense MD [facility] Pager [phone]  If 7PM-7AM, please contact night-coverage [email or web address] Password [email or web address]  02/01/2023, 10:37 AM

## 2023-02-01 NOTE — ED Notes (Signed)
ED TO INPATIENT HANDOFF REPORT  ED Nurse Name and Phone #:  Mellody Dance  [phone]  S Name/Age/Gender Kimberly Hunt 67 y.o. female Room/Bed: WA06/WA06  Code Status   Code Status: Full Code  Home/SNF/Other Home Patient oriented to: self, place, time, and situation Is this baseline? Yes   Triage Complete: Triage complete  Chief Complaint Acute CHF (congestive heart failure) (HCC) [I50.9]  Triage Note Pt complaining of feeling like she has chest tightness, and SOB. Pt does endorse one episode of nausea this morning, and a cough. States this all began yesterday, denies any pain. O2 saturation 83% on RA in triage.    Allergies Allergies  Allergen Reactions   Shellfish Allergy Hives    Hives    Level of Care/Admitting Diagnosis ED Disposition     ED Disposition  Admit   Condition  --   Comment  Hospital Area: [facility] [[id number]]  Level of Care: Telemetry [5]  Admit to tele based on following criteria: Acute CHF  May admit patient to [facility] or [facility] if equivalent level of care is available:: Yes  Covid Evaluation: Asymptomatic - no recent exposure (last 10 days) testing not required  Diagnosis: Acute CHF (congestive heart failure) [facility]) [[id number]]  Admitting Physician: Maryln Gottron [[id number]]  Attending Physician: Olexa.Dam, MIR Jaxson.Roy [[id number]]  Certification:: I certify this patient will need inpatient services for at least 2 midnights  Expected Medical Readiness: 02/03/2023          B Medical/Surgery History Past Medical History:  Diagnosis Date   Anxiety    Arthritis    Depression    Diabetes mellitus without complication (HCC)    Family history of breast cancer 02/08/2021   Family history of prostate cancer 02/08/2021   Heart murmur    no problems mild told in her 30's   Hypertension    Neuromuscular disorder (HCC)    neuropathy feet   Pancreatitis    Past Surgical History:  Procedure Laterality Date   CERVICAL  POLYPECTOMY     CHOLECYSTECTOMY     COLONOSCOPY W/ POLYPECTOMY     ROBOTIC ASSISTED TOTAL HYSTERECTOMY WITH BILATERAL SALPINGO OOPHERECTOMY N/A 01/12/2021   Procedure: XI ROBOTIC ASSISTED TOTAL HYSTERECTOMY WITH BILATERAL SALPINGO OOPHORECTOMY;  Surgeon: Adolphus Birchwood, MD;  Location: [facility];  Service: Gynecology;  Laterality: N/A;   SENTINEL NODE BIOPSY N/A 01/12/2021   Procedure: SENTINEL LYMPH NODE BIOPSY;  Surgeon: Adolphus Birchwood, MD;  Location: [facility];  Service: Gynecology;  Laterality: N/A;   TONSILLECTOMY     UTERINE FIBROID SURGERY       A IV Location/Drains/Wounds Patient Lines/Drains/Airways Status     Active Line/Drains/Airways     Name Placement date Placement time Site Days   Peripheral IV 02/01/23 20 G Right Antecubital 02/01/23  0855  Antecubital  less than 1   Incision - 5 Ports Abdomen 1: Right;Lateral 2: Upper;Umbilicus 3: Left;Lateral;Upper 4: Left;Lateral;Mid 5: Left;Lateral;Lower 01/12/21  --  -- 750            Intake/Output Last 24 hours No intake or output data in the 24 hours ending 02/01/23 1206  Labs/Imaging Results for orders placed or performed during the hospital encounter of 02/01/23 (from the past 48 hour(s))  Brain natriuretic peptide     Status: Abnormal   Collection Time: 02/01/23  8:46 AM  Result Value Ref Range   B Natriuretic Peptide 661.1 (H) 0.0 - 100.0 pg/mL    Comment: Performed at [facility], [address], [place], [place] [address]  CBC with Differential     Status: Abnormal   Collection Time: 02/01/23  8:46 AM  Result Value Ref Range   WBC 16.2 (H) 4.0 - 10.5 K/uL   RBC 2.86 (L) 3.87 - 5.11 MIL/uL   Hemoglobin 8.7 (L) 12.0 - 15.0 g/dL   HCT 40.9 (L) 81.1 - 91.4 %   MCV 92.7 80.0 - 100.0 fL   MCH 30.4 26.0 - 34.0 pg   MCHC 32.8 30.0 - 36.0 g/dL   RDW 78.2 95.6 - 21.3 %   Platelets 339 150 - 400 K/uL   nRBC 0.0 0.0 - 0.2 %   Neutrophils Relative % 83 %   Neutro Abs 13.4 (H) 1.7 - 7.7 K/uL   Lymphocytes  Relative 9 %   Lymphs Abs 1.4 0.7 - 4.0 K/uL   Monocytes Relative 7 %   Monocytes Absolute 1.1 (H) 0.1 - 1.0 K/uL   Eosinophils Relative 0 %   Eosinophils Absolute 0.0 0.0 - 0.5 K/uL   Basophils Relative 0 %   Basophils Absolute 0.1 0.0 - 0.1 K/uL   Immature Granulocytes 1 %   Abs Immature Granulocytes 0.11 (H) 0.00 - 0.07 K/uL    Comment: Performed at [facility], [address] [place], [place] [address]  Comprehensive metabolic panel     Status: Abnormal   Collection Time: 02/01/23  8:46 AM  Result Value Ref Range   Sodium 134 (L) 135 - 145 mmol/L   Potassium 5.5 (H) 3.5 - 5.1 mmol/L   Chloride 104 98 - 111 mmol/L   CO2 22 22 - 32 mmol/L   Glucose, Bld 110 (H) 70 - 99 mg/dL    Comment: Glucose reference range applies only to samples taken after fasting for at least 8 hours.   BUN 45 (H) 8 - 23 mg/dL   Creatinine, Ser 8.46 (H) 0.44 - 1.00 mg/dL   Calcium 8.3 (L) 8.9 - 10.3 mg/dL   Total Protein 7.4 6.5 - 8.1 g/dL   Albumin 3.6 3.5 - 5.0 g/dL   AST 28 15 - 41 U/L   ALT 26 0 - 44 U/L   Alkaline Phosphatase 68 38 - 126 U/L   Total Bilirubin 1.0 0.3 - 1.2 mg/dL   GFR, Estimated 29 (L) >60 mL/min    Comment: (NOTE) Calculated using the CKD-EPI Creatinine Equation (2021)    Anion gap 8 5 - 15    Comment: Performed at [facility], [address], [place], [place] [address]  Troponin I (High Sensitivity)     Status: None   Collection Time: 02/01/23  8:46 AM  Result Value Ref Range   Troponin I (High Sensitivity) 8 <18 ng/L    Comment: (NOTE) Elevated high sensitivity troponin I (hsTnI) values and significant  changes across serial measurements may suggest ACS but many other  chronic and acute conditions are known to elevate hsTnI results.  Refer to the "Links" section for chest pain algorithms and additional  guidance. Performed at [facility], [address], [place], [place] [address]   Troponin I (High  Sensitivity)     Status: None   Collection Time: 02/01/23 11:08 AM  Result Value Ref Range   Troponin I (High Sensitivity) 10 <18 ng/L    Comment: (NOTE) Elevated high sensitivity troponin I (hsTnI) values and significant  changes across serial measurements may suggest ACS but many other  chronic and acute  conditions are known to elevate hsTnI results.  Refer to the "Links" section for chest pain algorithms and additional  guidance. Performed at [facility], [address], [place], [place] [address]    DG Chest 2 View  Result Date: 02/01/2023 CLINICAL DATA:  Chest tightness and shortness of breath EXAM: CHEST - 2 VIEW COMPARISON:  Chest radiograph dated 07/30/2014 FINDINGS: Normal lung volumes. Bilateral interstitial opacities. Trace bilateral pleural effusions. No pneumothorax. The heart size and mediastinal contours are within normal limits. No acute osseous abnormality. IMPRESSION: Pulmonary edema and trace bilateral pleural effusions. Electronically Signed   By: Agustin Cree M.D.   On: 02/01/2023 09:27    Pending Labs Unresulted Labs (From admission, onward)     Start     Ordered   02/02/23 0500  Basic metabolic panel  Tomorrow morning,   R        02/01/23 1035   02/02/23 0500  CBC  Tomorrow morning,   R        02/01/23 1035   02/01/23 1900  Potassium  Once-Timed,   TIMED        02/01/23 1101   02/01/23 1034  Hemoglobin A1c  (Glycemic Control (SSI)  Q 4 Hours / Glycemic Control (SSI)  AC +/- HS)  Once,   R       Comments: To assess prior glycemic control    02/01/23 1035   02/01/23 1033  HIV Antibody (routine testing w rflx)  (HIV Antibody (Routine testing w reflex) panel)  Once,   R        02/01/23 1035            Vitals/Pain Today's Vitals   02/01/23 0816  BP: (!) 152/66  Pulse: 63  Resp: 20  Temp: 99.2 F (37.3 C)  TempSrc: Oral  SpO2: 91%  Weight: 79.4 kg  Height: 5' (1.524 m)  PainSc: 0-No pain    Isolation Precautions No active  isolations  Medications Medications  amLODipine (NORVASC) tablet 10 mg (10 mg Oral Given 02/01/23 1117)  enoxaparin (LOVENOX) injection 40 mg (40 mg Subcutaneous Given 02/01/23 1117)  insulin aspart (novoLOG) injection 0-15 Units (has no administration in time range)  insulin aspart (novoLOG) injection 0-5 Units (has no administration in time range)  acetaminophen (TYLENOL) tablet 650 mg (has no administration in time range)    Or  acetaminophen (TYLENOL) suppository 650 mg (has no administration in time range)  traZODone (DESYREL) tablet 25 mg (has no administration in time range)  ondansetron (ZOFRAN) tablet 4 mg (has no administration in time range)    Or  ondansetron (ZOFRAN) injection 4 mg (has no administration in time range)  albuterol (PROVENTIL) (2.5 MG/3ML) 0.083% nebulizer solution 2.5 mg (has no administration in time range)  hydrALAZINE (APRESOLINE) injection 5 mg (has no administration in time range)  carvedilol (COREG) tablet 6.25 mg (has no administration in time range)  furosemide (LASIX) injection 40 mg (has no administration in time range)  furosemide (LASIX) injection 40 mg (40 mg Intravenous Given 02/01/23 0948)    Mobility walks     Focused Assessments    R Recommendations: See Admitting Provider Note  Report given to:   Additional Notes:

## 2023-02-01 NOTE — ED Triage Notes (Addendum)
Pt complaining of feeling like she has chest tightness, and SOB. Pt does endorse one episode of nausea this morning, and a cough. States this all began yesterday, denies any pain. O2 saturation 83% on RA in triage.

## 2023-02-02 ENCOUNTER — Inpatient Hospital Stay (HOSPITAL_COMMUNITY): Payer: Medicare PPO

## 2023-02-02 DIAGNOSIS — I5021 Acute systolic (congestive) heart failure: Secondary | ICD-10-CM | POA: Diagnosis not present

## 2023-02-02 DIAGNOSIS — C541 Malignant neoplasm of endometrium: Secondary | ICD-10-CM | POA: Diagnosis not present

## 2023-02-02 DIAGNOSIS — I5020 Unspecified systolic (congestive) heart failure: Secondary | ICD-10-CM

## 2023-02-02 DIAGNOSIS — D649 Anemia, unspecified: Secondary | ICD-10-CM

## 2023-02-02 DIAGNOSIS — N179 Acute kidney failure, unspecified: Secondary | ICD-10-CM

## 2023-02-02 DIAGNOSIS — E1165 Type 2 diabetes mellitus with hyperglycemia: Secondary | ICD-10-CM

## 2023-02-02 DIAGNOSIS — N189 Chronic kidney disease, unspecified: Secondary | ICD-10-CM

## 2023-02-02 LAB — CBC
HCT: 24.5 % — ABNORMAL LOW (ref 36.0–46.0)
Hemoglobin: 7.8 g/dL — ABNORMAL LOW (ref 12.0–15.0)
MCH: 29.9 pg (ref 26.0–34.0)
MCHC: 31.8 g/dL (ref 30.0–36.0)
MCV: 93.9 fL (ref 80.0–100.0)
Platelets: 294 10*3/uL (ref 150–400)
RBC: 2.61 MIL/uL — ABNORMAL LOW (ref 3.87–5.11)
RDW: 12.9 % (ref 11.5–15.5)
WBC: 11.6 10*3/uL — ABNORMAL HIGH (ref 4.0–10.5)
nRBC: 0 % (ref 0.0–0.2)

## 2023-02-02 LAB — URINALYSIS, ROUTINE W REFLEX MICROSCOPIC
Bilirubin Urine: NEGATIVE
Glucose, UA: NEGATIVE mg/dL
Ketones, ur: NEGATIVE mg/dL
Leukocytes,Ua: NEGATIVE
Nitrite: NEGATIVE
Protein, ur: 100 mg/dL — AB
Specific Gravity, Urine: 1.012 (ref 1.005–1.030)
pH: 5 (ref 5.0–8.0)

## 2023-02-02 LAB — BASIC METABOLIC PANEL
Anion gap: 12 (ref 5–15)
BUN: 54 mg/dL — ABNORMAL HIGH (ref 8–23)
CO2: 22 mmol/L (ref 22–32)
Calcium: 8.5 mg/dL — ABNORMAL LOW (ref 8.9–10.3)
Chloride: 103 mmol/L (ref 98–111)
Creatinine, Ser: 2.09 mg/dL — ABNORMAL HIGH (ref 0.44–1.00)
GFR, Estimated: 25 mL/min — ABNORMAL LOW (ref >60)
Glucose, Bld: 115 mg/dL — ABNORMAL HIGH (ref 70–99)
Potassium: 4.2 mmol/L (ref 3.5–5.1)
Sodium: 137 mmol/L (ref 135–145)

## 2023-02-02 LAB — ECHOCARDIOGRAM COMPLETE
AR max vel: 1.85 cm2
AV Peak grad: 12.3 mm[Hg]
Ao pk vel: 1.75 m/s
Area-P 1/2: 4.68 cm2
Height: 60 in
MV M vel: 3.37 m/s
MV Peak grad: 45.3 mm[Hg]
S' Lateral: 2.9 cm
Weight: 2856 [oz_av]

## 2023-02-02 LAB — PROTEIN / CREATININE RATIO, URINE
Creatinine, Urine: 92 mg/dL
Protein Creatinine Ratio: 1.47 mg/mg{creat} — ABNORMAL HIGH (ref 0.00–0.15)
Total Protein, Urine: 135 mg/dL

## 2023-02-02 LAB — SODIUM, URINE, RANDOM: Sodium, Ur: 63 mmol/L

## 2023-02-02 LAB — GLUCOSE, CAPILLARY
Glucose-Capillary: 96 mg/dL (ref 70–99)
Glucose-Capillary: 153 mg/dL — ABNORMAL HIGH (ref 70–99)
Glucose-Capillary: 94 mg/dL (ref 70–99)
Glucose-Capillary: 124 mg/dL — ABNORMAL HIGH (ref 70–99)

## 2023-02-02 LAB — RETICULOCYTES
Immature Retic Fract: 11.3 % (ref 2.3–15.9)
RBC.: 2.65 MIL/uL — ABNORMAL LOW (ref 3.87–5.11)
Retic Count, Absolute: 69.4 10*3/uL (ref 19.0–186.0)
Retic Ct Pct: 2.6 % (ref 0.4–3.1)

## 2023-02-02 LAB — CREATININE, URINE, RANDOM: Creatinine, Urine: 93 mg/dL

## 2023-02-02 LAB — CK: Total CK: 121 U/L (ref 38–234)

## 2023-02-02 LAB — IRON AND TIBC
Iron: 14 ug/dL — ABNORMAL LOW (ref 28–170)
Saturation Ratios: 6 % — ABNORMAL LOW (ref 10.4–31.8)
TIBC: 242 ug/dL — ABNORMAL LOW (ref 250–450)
UIBC: 228 ug/dL

## 2023-02-02 LAB — VITAMIN B12: Vitamin B-12: 203 pg/mL (ref 180–914)

## 2023-02-02 LAB — D-DIMER, QUANTITATIVE: D-Dimer, Quant: 1.11 ug{FEU}/mL — ABNORMAL HIGH (ref 0.00–0.50)

## 2023-02-02 LAB — FERRITIN: Ferritin: 154 ng/mL (ref 11–307)

## 2023-02-02 LAB — FOLATE: Folate: 7.9 ng/mL (ref >5.9)

## 2023-02-02 MED ORDER — FUROSEMIDE 10 MG/ML IJ SOLN
60.0000 mg | Freq: Three times a day (TID) | INTRAMUSCULAR | Status: DC
  Administered 2023-02-02 – 2023-02-03 (×3): 60 mg via INTRAVENOUS
  Filled 2023-02-02 (×3): qty 6

## 2023-02-02 MED ORDER — FUROSEMIDE 10 MG/ML IJ SOLN: 40.0000 mg | Freq: Every day | INTRAMUSCULAR | Status: DC

## 2023-02-02 NOTE — Consult Note (Signed)
Renal Service Consult Note [facility]  Kimberly Hunt 02/02/2023 Kimberly Krabbe, MD Requesting Physician: Dr. Isidoro Donning  Reason for Consult: Renal failure HPI: The patient is a 67 y.o. year-old w/ PMH as below who presented to ED 10/09 w/ c/o chest tightness and SOB. All started yesterday, no chest pain. SpO2 83% on RA in triage. +orthopnea. In ED pt rec'd 2 L Lutz O2. BP 150/66, labs showed normal trop, wbc 16, Hb 8.7  , BNP 661, creat 1.88, K 5.5, Na 134.  B/l creatinine is normal when last checked in 2022. Pt was admitted. CXR showed pulm edema and trace effusions. Pt rec'd IV lasix 40mg  bid. Today creat trended up to 2.09 so IV lasix was lowered to once dailiy and I/O's and daily weights were requested. ECHO and d-dimer were pending. We are asked to see for renal failure.   Pt states her breathing here got better when she was placed on O2. Has not noted any other changes. No LUTS, no abd pain or flank pain and no hematuria. 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   ROS - denies CP, no joint pain, no HA, no blurry vision, no rash, no diarrhea, no nausea/ vomiting, no dysuria, no difficulty voiding   Past Medical History  Past Medical History:  Diagnosis Date   Anxiety    Arthritis    Depression    Diabetes mellitus without complication (HCC)    Family history of breast cancer 02/08/2021   Family history of prostate cancer 02/08/2021   Heart murmur    no problems mild told in her 30's   Hypertension    Neuromuscular disorder (HCC)    neuropathy feet   Pancreatitis    Past Surgical History  Past Surgical History:  Procedure Laterality Date   CERVICAL POLYPECTOMY     CHOLECYSTECTOMY     COLONOSCOPY W/ POLYPECTOMY     ROBOTIC ASSISTED TOTAL HYSTERECTOMY WITH BILATERAL SALPINGO OOPHERECTOMY N/A 01/12/2021   Procedure: XI ROBOTIC ASSISTED TOTAL HYSTERECTOMY WITH BILATERAL SALPINGO OOPHORECTOMY;  Surgeon: Adolphus Birchwood, MD;   Location: [facility];  Service: Gynecology;  Laterality: N/A;   SENTINEL NODE BIOPSY N/A 01/12/2021   Procedure: SENTINEL LYMPH NODE BIOPSY;  Surgeon: Adolphus Birchwood, MD;  Location: [facility];  Service: Gynecology;  Laterality: N/A;   TONSILLECTOMY     UTERINE FIBROID SURGERY     Family History  Family History  Problem Relation Age of Onset   Breast cancer Mother        dx after 23   Cancer Maternal Aunt        unknown type; dx after 50   Cancer Maternal Uncle        unknown type; dx after 8, x2 mat uncles   Cancer Paternal Aunt        unknown type; dx after 34   Prostate cancer Paternal Uncle        dx after 17; mets   Social History  reports that she quit smoking about 44 years ago. Her smoking use included cigarettes. She started smoking about 54 years ago. She has never used smokeless tobacco. She reports that she does not drink alcohol and does not use drugs. Allergies  Allergies  Allergen Reactions   Shellfish Allergy Hives and Cough   Home medications Prior to Admission medications   Medication Sig Start Date End Date Taking? Authorizing Provider  amLODipine (NORVASC) 10 MG tablet  Take 10 mg by mouth at bedtime. 08/31/20  Yes [provider]  citalopram (CELEXA) 40 MG tablet Take 40 mg by mouth at bedtime.   Yes [provider]  gabapentin (NEURONTIN) 300 MG capsule Take 300 mg by mouth at bedtime as needed (for pain). 08/25/20  Yes [provider]  HYDROcodone-acetaminophen (NORCO) 7.5-325 MG tablet Take 1-2 tablets by mouth See admin instructions. Take 2 tablets by mouth at bedtime and an additional 1 tablet once a day as needed for pain   Yes [provider]  ibuprofen (IBU-200) 200 MG tablet Take 200-400 mg by mouth every 6 (six) hours as needed for mild pain or headache.   Yes [provider]  insulin aspart (NOVOLOG FLEXPEN) 100 UNIT/ML FlexPen Inject 0-20 Units into the skin 3 (three) times daily with meals. 09/04/19  Yes [provider]  irbesartan-hydrochlorothiazide (AVALIDE) 150-12.5 MG tablet Take 1 tablet by mouth every evening. 01/01/21  Yes [provider]  LORazepam (ATIVAN) 0.5 MG tablet Take 0.5 mg by mouth 2 (two) times daily as needed for anxiety. 08/18/20  Yes [provider]  metFORMIN (GLUCOPHAGE-XR) 500 MG 24 hr tablet Take 500 mg by mouth every evening. 12/21/20  Yes [provider]  metoprolol succinate (TOPROL-XL) 50 MG 24 hr tablet Take 50 mg by mouth at bedtime. 01/19/23  Yes [provider]  pantoprazole (PROTONIX) 40 MG tablet Take 40 mg by mouth at bedtime. 01/19/23  Yes [provider]  rosuvastatin (CRESTOR) 10 MG tablet Take 10 mg by mouth every Friday. 01/19/23  Yes [provider]  TOUJEO MAX SOLOSTAR 300 UNIT/ML Solostar Pen Inject 45 Units into the skin every evening. 11/20/20  Yes [provider]  zolpidem (AMBIEN) 10 MG tablet Take 10 mg by mouth at bedtime as needed for sleep. 10/31/16  Yes [provider]  acetaminophen (TYLENOL) 500 MG tablet Take 1 tablet (500 mg total) by mouth every 6 (six) hours as needed. Patient not taking: Reported on 02/01/2023 01/12/21   Adolphus Birchwood, MD  blood glucose meter kit and supplies KIT Dispense based on patient and insurance preference. Use up to four times daily as directed. (FOR ICD-9 250.00, 250.01). 06/04/14   Osvaldo Shipper, MD  ibuprofen (ADVIL) 800 MG tablet Take 1 tablet (800 mg total) by mouth every 8 (eight) hours as needed. Patient not taking: Reported on 02/04/2021 01/12/21   Adolphus Birchwood, MD     Vitals:   02/01/23 2200 02/02/23 0337 02/02/23 0709 02/02/23 1157  BP:  (!) 164/67  (!) 160/67  Pulse:  76  73  Resp: 20   (!) 21  Temp: 98.6 F (37 C) 99.2 F (37.3 C)  99.2 F (37.3 C)  TempSrc: Oral Oral  Oral  SpO2:  94%  95%  Weight:   81 kg   Height:       Exam Gen alert, no distress No rash, cyanosis or gangrene Sclera anicteric, throat clear  +jvd Chest -  rales R base 1/3 up, L clear RRR no MRG Abd soft ntnd no mass or ascites +bs GU defer MS no joint effusions or deformity Ext no LE or UE edema, no wounds or ulcers Neuro is alert, Ox 3 , nf      Renal-related home meds: - gabapentin 300 hs prn  - ibuprofen - irbesartan- hydrochlorothiazide 150-12.5 every day - metoprolol xl 50 hs - norvasc 10 hs     Date   Creat  eGFR  2011- 2022  0.66- 1.00    Oct 2023  1.30  45 ml/min    02/01/23  1.88  29 ml/min     02/02/23  2.09  25 ml/min       CXR 10/09 - + bilat pulm edema and trace bilat effusions     I/O since admission --> 460 cc in and 200 cc out = +260 cc      Na 137  K 4.2 CO2 22  BUN 54  Creat 2.09           Alb 3.6   LFT"s wnl    eGFR 25       WBC 11K Hb 7.8          UA 10/10 --> prot 100, rare bact, 0-5 rbc/ wcb/ epis      Renal US - pending          ECHO - pending results  Assessment/ Plan: AKI on CKD 3a - b/l creatinine 1.30 from 2023, eGFR 45 ml/min.  Creat here was 1.8 on admission in the setting of decompensated CHF/ pulm edema. Creat bumped to 2.0 today after getting 24 hrs of IV lasix 40 bid. Still requiring O2, still has rales. Suspect AKI due to decompensated CHF. Will resume IV lasix at higher dose of 60mg  tid. F/u labs in the morning, strict I/O's.  Suspected acute heart failure - w/u in progress Hyperkalemia - resolved DM2 - per pmd    Vinson Moselle  MD [facility] 02/02/2023, 2:50 PM  Recent Labs  Lab 02/01/23 0846 02/01/23 1905 02/02/23 0425  HGB 8.7*  --  7.8*  ALBUMIN 3.6  --   --   CALCIUM 8.3*  --  8.5*  CREATININE 1.88*  --  2.09*  K 5.5* 5.3* 4.2   Inpatient medications:  amLODipine  10 mg Oral Daily   carvedilol  6.25 mg Oral BID WC   enoxaparin (LOVENOX) injection  30 mg Subcutaneous Q24H   furosemide  40 mg Intravenous Daily   insulin aspart  0-15 Units Subcutaneous TID WC   insulin aspart  0-5 Units Subcutaneous QHS    acetaminophen **OR** acetaminophen, albuterol, hydrALAZINE, ondansetron  **OR** ondansetron (ZOFRAN) IV, mouth rinse, traZODone

## 2023-02-02 NOTE — TOC CM/SW Note (Signed)
Transition of Care [facility]) - Inpatient Brief Assessment   Patient Details  Name: Kimberly Hunt MRN: [id number] Date of Birth: June 12, 1955  Transition of Care [facility]) CM/SW Contact:    Larrie Kass, LCSW Phone Number: 02/02/2023, 3:32 PM      Transition of Care Asessment: Insurance and Status: Insurance coverage has been reviewed Patient has primary care physician: Yes Home environment has been reviewed: home with self Prior level of function:: independent Prior/Current Home Services: No current home services Social Determinants of Health Reivew: SDOH reviewed no interventions necessary Readmission risk has been reviewed: Yes Transition of care needs: no transition of care needs at this time

## 2023-02-02 NOTE — Progress Notes (Addendum)
[facility]                                                                              Kimberly Hunt, is a 67 y.o. female, DOB - Jan 29, 1956, NUU:[id number] Admit date - 02/01/2023    Outpatient Primary MD for the patient is Kimberly Pea Tawni Pummel, MD  LOS - 1  days  Chief Complaint  Patient presents with   Shortness of Breath            Brief summary   Patient is a 67 year old female with hypertension, diabetes mellitus, poorly controlled, endometrial CA status post hysterectomy presented with shortness of breath and nonproductive cough.  Patient reported that she was in her usual state of health until a day before when she started noticing some dyspnea with exertion, no lower extremity edema chest pain, palpitations or fevers.  She reported having a hard time laying flat, felt short of breath and some chest pressure.  She felt abdominal fullness. In ED, was noted to be hypoxic with O2 sats 83% on room air.  BP 152/66, pulse 63, RR 20 Sats improved to 91% on 2 L  Assessment & Plan     Acute respiratory failure with hypoxia secondary to pulmonary edema, acute CHF (congestive heart failure) (HCC) -No prior history of CHF, presented with dyspnea, apnea, elevated BNP 661, hypoxia.  Chest x-ray showed pulmonary edema and trace bilateral pleural effusions -Patient started on IV Lasix 40 mg twice daily however creatinine trended up to 2.09 today, Decrease Lasix to 40 mg daily -I/O'Hunt and daily weights -Follow 2D echo, obtain D-dimer -Wean O2 as tolerated  Active Problems:    Acute kidney injury superimposed on chronic kidney disease, 3a (HCC), hyperkalemia -Creatinine 1.3 with GFR 45 on 02/08/2022, presented with creatinine of 1.8, -Worsened to 2.09 today due to IV Lasix diuresis -Follow renal ultrasound, CK, SPEP UPEP given new anemia -UA showed 100 protein, no UTI, small hemoglobinuria - renal consulted   Normocytic anemia -Unclear etiology, no reports of GI  bleeding, hemoglobin 12.4 on 02/08/2022, presented with hemoglobin of 8.7 -Hemoglobin 7.8 today obtain anemia panel, FOBT, SPEP, UPEP, - UA with small hemoglobinuria, check CK    Type 2 diabetes mellitus with hyperglycemia (HCC) Hemoglobin A1c 6.0 CBG (last 3)  Recent Labs    02/01/23 2018 02/02/23 0748 02/02/23 1155  GLUCAP 168* 96 153*  Continue sliding scale insulin      Endometrial cancer (HCC), stage I grade 1 -Status post total hysterectomy with bilateral salpingo-oophorectomy in 12/2020 Outpatient follow-up with Gyn-onc   Obesity Estimated body mass index is 34.86 kg/m as calculated from the following:   Height as of this encounter: 5' (1.524 m).   Weight as of this encounter: 81 kg.  Code Status: full code  DVT Prophylaxis:  enoxaparin (LOVENOX) injection 30 mg Start: 02/02/23 1045 SCDs Start: 02/01/23 1034   Level of Care: Level of care: Telemetry Family Communication: Updated patient Disposition Plan:      Remains inpatient appropriate: Workup in progress   Procedures:    Consultants:   Renal   Antimicrobials:   Anti-infectives (From admission, onward)  None          Medications  amLODipine  10 mg Oral Daily   carvedilol  6.25 mg Oral BID WC   enoxaparin (LOVENOX) injection  30 mg Subcutaneous Q24H   furosemide  40 mg Intravenous Daily   insulin aspart  0-15 Units Subcutaneous TID WC   insulin aspart  0-5 Units Subcutaneous QHS      Subjective:   Kimberly Hunt was seen and examined today.  Feeling slightly better today, was having orthopnea at home.  No acute chest pain, shortness of breath improving.    Patient denies dizziness, abdominal pain, N/V/D/C, new weakness Objective:   Vitals:   02/01/23 2200 02/02/23 0337 02/02/23 0709 02/02/23 1157  BP:  (!) 164/67  (!) 160/67  Pulse:  76  73  Resp: 20   (!) 21  Temp: 98.6 F (37 C) 99.2 F (37.3 C)  99.2 F (37.3 C)  TempSrc: Oral Oral  Oral  SpO2:  94%  95%  Weight:   81 kg    Height:        Intake/Output Summary (Last 24 hours) at 02/02/2023 1247 Last data filed at 02/02/2023 0857 Gross per 24 hour  Intake 340 ml  Output 200 ml  Net 140 ml     Wt Readings from Last 3 Encounters:  02/02/23 81 kg  06/07/22 79.4 kg  02/04/21 87.1 kg     Exam General: Alert and oriented x 3, NAD Cardiovascular: S1 S2 auscultated,  RRR Respiratory: Bibasilar crackles Gastrointestinal: Soft, nontender, nondistended, + bowel sounds Ext: + pedal edema bilaterally Neuro: no new deficits Skin: No rashes Psych: Normal affect     Data Reviewed:  I have personally reviewed following labs    CBC Lab Results  Component Value Date   WBC 11.6 (H) 02/02/2023   RBC 2.61 (L) 02/02/2023   HGB 7.8 (L) 02/02/2023   HCT 24.5 (L) 02/02/2023   MCV 93.9 02/02/2023   MCH 29.9 02/02/2023   PLT 294 02/02/2023   MCHC 31.8 02/02/2023   RDW 12.9 02/02/2023   LYMPHSABS 1.4 02/01/2023   MONOABS 1.1 (H) 02/01/2023   EOSABS 0.0 02/01/2023   BASOSABS 0.1 02/01/2023     Last metabolic panel Lab Results  Component Value Date   NA 137 02/02/2023   K 4.2 02/02/2023   CL 103 02/02/2023   CO2 22 02/02/2023   BUN 54 (H) 02/02/2023   CREATININE 2.09 (H) 02/02/2023   GLUCOSE 115 (H) 02/02/2023   GFRNONAA 25 (L) 02/02/2023   GFRAA >60 12/23/2016   CALCIUM 8.5 (L) 02/02/2023   PROT 7.4 02/01/2023   ALBUMIN 3.6 02/01/2023   BILITOT 1.0 02/01/2023   ALKPHOS 68 02/01/2023   AST 28 02/01/2023   ALT 26 02/01/2023   ANIONGAP 12 02/02/2023    CBG (last 3)  Recent Labs    02/01/23 2018 02/02/23 0748 02/02/23 1155  GLUCAP 168* 96 153*      Coagulation Profile: No results for input(Hunt): "INR", "PROTIME" in the last 168 hours.   Radiology Studies: I have personally reviewed the imaging studies  DG Chest 2 View  Result Date: 02/01/2023 CLINICAL DATA:  Chest tightness and shortness of breath EXAM: CHEST - 2 VIEW COMPARISON:  Chest radiograph dated 07/30/2014 FINDINGS:  Normal lung volumes. Bilateral interstitial opacities. Trace bilateral pleural effusions. No pneumothorax. The heart size and mediastinal contours are within normal limits. No acute osseous abnormality. IMPRESSION: Pulmonary edema and trace bilateral pleural effusions. Electronically Signed  By: Agustin Cree M.D.   On: 02/01/2023 09:27       Kimberly Shannon M.D. [facility] 02/02/2023, 12:47 PM  Available via Epic secure chat 7am-7pm After 7 pm, please refer to night coverage provider listed on amion.

## 2023-02-02 NOTE — Progress Notes (Signed)
Echocardiogram 2D Echocardiogram has been performed.  Lucendia Herrlich 02/02/2023, 11:36 AM

## 2023-02-03 DIAGNOSIS — N179 Acute kidney failure, unspecified: Secondary | ICD-10-CM | POA: Diagnosis not present

## 2023-02-03 DIAGNOSIS — D649 Anemia, unspecified: Secondary | ICD-10-CM | POA: Diagnosis not present

## 2023-02-03 DIAGNOSIS — D509 Iron deficiency anemia, unspecified: Secondary | ICD-10-CM

## 2023-02-03 DIAGNOSIS — C541 Malignant neoplasm of endometrium: Secondary | ICD-10-CM | POA: Diagnosis not present

## 2023-02-03 DIAGNOSIS — I5020 Unspecified systolic (congestive) heart failure: Secondary | ICD-10-CM | POA: Diagnosis not present

## 2023-02-03 LAB — CBC
HCT: 23.1 % — ABNORMAL LOW (ref 36.0–46.0)
Hemoglobin: 7.5 g/dL — ABNORMAL LOW (ref 12.0–15.0)
MCH: 29.5 pg (ref 26.0–34.0)
MCHC: 32.5 g/dL (ref 30.0–36.0)
MCV: 90.9 fL (ref 80.0–100.0)
Platelets: 314 10*3/uL (ref 150–400)
RBC: 2.54 MIL/uL — ABNORMAL LOW (ref 3.87–5.11)
RDW: 12.5 % (ref 11.5–15.5)
WBC: 8.7 10*3/uL (ref 4.0–10.5)
nRBC: 0 % (ref 0.0–0.2)

## 2023-02-03 LAB — GLUCOSE, CAPILLARY
Glucose-Capillary: 127 mg/dL — ABNORMAL HIGH (ref 70–99)
Glucose-Capillary: 162 mg/dL — ABNORMAL HIGH (ref 70–99)
Glucose-Capillary: 115 mg/dL — ABNORMAL HIGH (ref 70–99)
Glucose-Capillary: 178 mg/dL — ABNORMAL HIGH (ref 70–99)

## 2023-02-03 LAB — BASIC METABOLIC PANEL
Anion gap: 11 (ref 5–15)
BUN: 59 mg/dL — ABNORMAL HIGH (ref 8–23)
CO2: 24 mmol/L (ref 22–32)
Calcium: 8.3 mg/dL — ABNORMAL LOW (ref 8.9–10.3)
Chloride: 101 mmol/L (ref 98–111)
Creatinine, Ser: 2.09 mg/dL — ABNORMAL HIGH (ref 0.44–1.00)
GFR, Estimated: 25 mL/min — ABNORMAL LOW (ref >60)
Glucose, Bld: 137 mg/dL — ABNORMAL HIGH (ref 70–99)
Potassium: 4.2 mmol/L (ref 3.5–5.1)
Sodium: 136 mmol/L (ref 135–145)

## 2023-02-03 LAB — PREPARE RBC (CROSSMATCH)

## 2023-02-03 MED ORDER — SODIUM CHLORIDE 0.9% IV SOLUTION: Freq: Once | INTRAVENOUS | Status: DC

## 2023-02-03 MED ORDER — VITAMIN B-12 1000 MCG PO TABS
1000.0000 ug | ORAL_TABLET | Freq: Every day | ORAL | Status: DC
  Administered 2023-02-03 – 2023-02-06 (×4): 1000 ug via ORAL
  Filled 2023-02-03 (×4): qty 1

## 2023-02-03 MED ORDER — FOLIC ACID 1 MG PO TABS
1.0000 mg | ORAL_TABLET | Freq: Every day | ORAL | Status: DC
  Administered 2023-02-03 – 2023-02-06 (×4): 1 mg via ORAL
  Filled 2023-02-03 (×4): qty 1

## 2023-02-03 MED ORDER — FUROSEMIDE 10 MG/ML IJ SOLN
80.0000 mg | Freq: Three times a day (TID) | INTRAMUSCULAR | Status: DC
  Administered 2023-02-03 – 2023-02-04 (×3): 80 mg via INTRAVENOUS
  Filled 2023-02-03 (×3): qty 8

## 2023-02-03 NOTE — Plan of Care (Signed)
Problem: Education: Goal: Ability to describe self-care measures that may prevent or decrease complications (Diabetes Survival Skills Education) will improve 02/03/2023 1706 by Janan Halter, RN Outcome: Progressing 02/03/2023 1706 by Janan Halter, RN Outcome: Progressing   Problem: Coping: Goal: Ability to adjust to condition or change in health will improve 02/03/2023 1706 by Janan Halter, RN Outcome: Progressing 02/03/2023 1706 by Janan Halter, RN Outcome: Progressing   Problem: Fluid Volume: Goal: Ability to maintain a balanced intake and output will improve 02/03/2023 1706 by Janan Halter, RN Outcome: Progressing 02/03/2023 1706 by Janan Halter, RN Outcome: Progressing   Problem: Health Behavior/Discharge Planning: Goal: Ability to identify and utilize available resources and services will improve 02/03/2023 1706 by Janan Halter, RN Outcome: Progressing 02/03/2023 1706 by Janan Halter, RN Outcome: Progressing Goal: Ability to manage health-related needs will improve 02/03/2023 1706 by Janan Halter, RN Outcome: Progressing 02/03/2023 1706 by Janan Halter, RN Outcome: Progressing   Problem: Metabolic: Goal: Ability to maintain appropriate glucose levels will improve 02/03/2023 1706 by Janan Halter, RN Outcome: Progressing 02/03/2023 1706 by Janan Halter, RN Outcome: Progressing   Problem: Nutritional: Goal: Maintenance of adequate nutrition will improve 02/03/2023 1706 by Janan Halter, RN Outcome: Progressing 02/03/2023 1706 by Janan Halter, RN Outcome: Progressing Goal: Progress toward achieving an optimal weight will improve 02/03/2023 1706 by Janan Halter, RN Outcome: Progressing 02/03/2023 1706 by Janan Halter, RN Outcome: Progressing   Problem: Skin Integrity: Goal: Risk for  impaired skin integrity will decrease 02/03/2023 1706 by Janan Halter, RN Outcome: Progressing 02/03/2023 1706 by Janan Halter, RN Outcome: Progressing   Problem: Tissue Perfusion: Goal: Adequacy of tissue perfusion will improve 02/03/2023 1706 by Janan Halter, RN Outcome: Progressing 02/03/2023 1706 by Janan Halter, RN Outcome: Progressing   Problem: Education: Goal: Knowledge of General Education information will improve Description: Including pain rating scale, medication(s)/side effects and non-pharmacologic comfort measures 02/03/2023 1706 by Janan Halter, RN Outcome: Progressing 02/03/2023 1706 by Janan Halter, RN Outcome: Progressing   Problem: Health Behavior/Discharge Planning: Goal: Ability to manage health-related needs will improve 02/03/2023 1706 by Janan Halter, RN Outcome: Progressing 02/03/2023 1706 by Janan Halter, RN Outcome: Progressing   Problem: Clinical Measurements: Goal: Ability to maintain clinical measurements within normal limits will improve Outcome: Progressing Goal: Will remain free from infection Outcome: Progressing Goal: Diagnostic test results will improve Outcome: Progressing Goal: Respiratory complications will improve Outcome: Progressing Goal: Cardiovascular complication will be avoided Outcome: Progressing   Problem: Activity: Goal: Risk for activity intolerance will decrease Outcome: Progressing   Problem: Nutrition: Goal: Adequate nutrition will be maintained Outcome: Progressing   Problem: Coping: Goal: Level of anxiety will decrease Outcome: Progressing   Problem: Elimination: Goal: Will not experience complications related to bowel motility Outcome: Progressing Goal: Will not experience complications related to urinary retention Outcome: Progressing   Problem: Pain Managment: Goal: General experience of comfort will  improve Outcome: Progressing   Problem: Safety: Goal: Ability to remain free from injury will improve Outcome: Progressing   Problem: Skin Integrity: Goal: Risk for impaired skin integrity will decrease Outcome: Progressing   Problem: Education: Goal: Ability to demonstrate management of disease process will improve Outcome: Progressing Goal: Ability to verbalize understanding of medication therapies will improve Outcome: Progressing Goal:  Individualized Educational Video(s) Outcome: Progressing   Problem: Activity: Goal: Capacity to carry out activities will improve Outcome: Progressing   Problem: Cardiac: Goal: Ability to achieve and maintain adequate cardiopulmonary perfusion will improve Outcome: Progressing

## 2023-02-03 NOTE — Progress Notes (Signed)
[facility]                                                                              Kimberly Hunt, is a 67 y.o. female, DOB - 1956-02-06, FAO:[id number] Admit date - 02/01/2023    Outpatient Primary MD for the patient is [name], MD  LOS - 2  days  Chief Complaint  Patient presents with   Shortness of Breath            Brief summary   Patient is a 67 year old female with hypertension, diabetes mellitus, poorly controlled, endometrial CA status post hysterectomy presented with shortness of breath and nonproductive cough.  Patient reported that she was in her usual state of health until a day before when she started noticing some dyspnea with exertion, no lower extremity edema chest pain, palpitations or fevers.  She reported having a hard time laying flat, felt short of breath and some chest pressure.  She felt abdominal fullness. In ED, was noted to be hypoxic with O2 sats 83% on room air.  BP 152/66, pulse 63, RR 20 Sats improved to 91% on 2 L  Assessment & Plan    Acute respiratory failure with hypoxia secondary to pulmonary edema, acute CHF (congestive heart failure) (HCC) -No prior history of CHF, presented with dyspnea, apnea, elevated BNP 661, hypoxia.  Chest x-ray showed pulmonary edema and trace bilateral pleural effusions -Patient started on IV Lasix 40 mg twice daily, creatinine trended up to 2.0 -Nephrology consulted, increased IV Lasix to 60 mg 3 times daily, creatinine continues to be stable -Still volume overload, plan for increasing Lasix to 80 mg IV 3 times daily.   Active Problems:    Acute kidney injury superimposed on chronic kidney disease, 3a (HCC), hyperkalemia -Creatinine 1.3 with GFR 45 on 02/08/2022, presented with creatinine of 1.8, -Worsened to 2.0, nephrology consulted -Renal ultrasound showed no obstruction or hydronephrosis   Normocytic anemia, appears acute Borderline B12, iron deficiency -Unclear etiology, no  reports of GI bleeding, hemoglobin 12.4 on 02/08/2022, presented with hemoglobin of 8.7-> 7.5 today -B12 203, folate 7.9, iron panel showed Fe 14, percent saturation ratio 6, ferritin 154, TIBC 242 -Has history of GERD, per patient no recent use of any NSAIDs. - GI consulted -Transfuse 1 unit of packed RBCs    Type 2 diabetes mellitus with hyperglycemia (HCC) Hemoglobin A1c 6.0 CBG (last 3)  Recent Labs    02/02/23 2221 02/03/23 0734 02/03/23 1147  GLUCAP 124* 127* 162*  Continue sliding scale insulin      Endometrial cancer (HCC), stage I grade 1 -Status post total hysterectomy with bilateral salpingo-oophorectomy in 12/2020 Outpatient follow-up with Gyn-onc   Obesity Estimated body mass index is 34.86 kg/m as calculated from the following:   Height as of this encounter: 5' (1.524 m).   Weight as of this encounter: 81 kg.  Code Status: full code  DVT Prophylaxis:  Place and maintain sequential compression device Start: 02/03/23 1402 SCDs Start: 02/01/23 1034   Level of Care: Level of care: Telemetry Family Communication: Updated patient Disposition Plan:      Remains inpatient appropriate: Workup in  progress   Procedures:    Consultants:   Renal  GI Antimicrobials:   Anti-infectives (From admission, onward)    None          Medications  amLODipine  10 mg Oral Daily   carvedilol  6.25 mg Oral BID WC   furosemide  80 mg Intravenous Q8H   insulin aspart  0-15 Units Subcutaneous TID WC   insulin aspart  0-5 Units Subcutaneous QHS      Subjective:   Kimberly Hunt was seen and examined today.  States had been feeling tired and dizzy in the last few weeks.  No acute chest pain or nausea or vomiting, fevers.  Overall feeling better with shortness of breath.  No abdominal pain, hematochezia, melena or hematemesis.   Objective:   Vitals:   02/02/23 1157 02/02/23 2220 02/03/23 0800 02/03/23 1253  BP: (!) 160/67 (!) 153/67  (!) 147/70  Pulse: 73 72   63  Resp: (!) 21 18 17 19   Temp: 99.2 F (37.3 C) 99.2 F (37.3 C)  98.4 F (36.9 C)  TempSrc: Oral Oral  Oral  SpO2: 95% 96%  91%  Weight:      Height:        Intake/Output Summary (Last 24 hours) at 02/03/2023 1402 Last data filed at 02/03/2023 1103 Gross per 24 hour  Intake 300 ml  Output 1000 ml  Net -700 ml     Wt Readings from Last 3 Encounters:  02/02/23 81 kg  06/07/22 79.4 kg  02/04/21 87.1 kg    Physical Exam General: Alert and oriented x 3, NAD Cardiovascular: S1 S2 clear, RRR.  Respiratory: Bibasilar crackles Gastrointestinal: Soft, nontender, nondistended, NBS Ext: no pedal edema bilaterally Neuro: no new deficits Psych: Normal affect     Data Reviewed:  I have personally reviewed following labs    CBC Lab Results  Component Value Date   WBC 8.7 02/03/2023   RBC 2.54 (L) 02/03/2023   HGB 7.5 (L) 02/03/2023   HCT 23.1 (L) 02/03/2023   MCV 90.9 02/03/2023   MCH 29.5 02/03/2023   PLT 314 02/03/2023   MCHC 32.5 02/03/2023   RDW 12.5 02/03/2023   LYMPHSABS 1.4 02/01/2023   MONOABS 1.1 (H) 02/01/2023   EOSABS 0.0 02/01/2023   BASOSABS 0.1 02/01/2023     Last metabolic panel Lab Results  Component Value Date   NA 136 02/03/2023   K 4.2 02/03/2023   CL 101 02/03/2023   CO2 24 02/03/2023   BUN 59 (H) 02/03/2023   CREATININE 2.09 (H) 02/03/2023   GLUCOSE 137 (H) 02/03/2023   GFRNONAA 25 (L) 02/03/2023   GFRAA >60 12/23/2016   CALCIUM 8.3 (L) 02/03/2023   PROT 7.4 02/01/2023   ALBUMIN 3.6 02/01/2023   BILITOT 1.0 02/01/2023   ALKPHOS 68 02/01/2023   AST 28 02/01/2023   ALT 26 02/01/2023   ANIONGAP 11 02/03/2023    CBG (last 3)  Recent Labs    02/02/23 2221 02/03/23 0734 02/03/23 1147  GLUCAP 124* 127* 162*      Coagulation Profile: No results for input(s): "INR", "PROTIME" in the last 168 hours.   Radiology Studies: I have personally reviewed the imaging studies  US RENAL  Result Date: 02/02/2023 CLINICAL DATA:   Acute kidney injury EXAM: RENAL / URINARY TRACT ULTRASOUND COMPLETE COMPARISON:  CT 02/08/2022 FINDINGS: Right Kidney: Renal measurements: 9.2 x 4.1 x 4.5 cm = volume: 89.6 mL. Echogenicity within normal limits. No mass or hydronephrosis visualized. Left Kidney:  Renal measurements: 11.3 x 5.2 x 5.2 cm = volume: 161.2 mL. Echogenicity within normal limits. No mass or hydronephrosis visualized. Bladder: Appears normal for degree of bladder distention. 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 IMPRESSION: No collecting system dilatation. Electronically Signed   By: Karen Kays M.D.   On: 02/02/2023 19:55   ECHOCARDIOGRAM COMPLETE  Result Date: 02/02/2023    ECHOCARDIOGRAM REPORT   Patient Name:   Kimberly Hunt Date of Exam: 02/02/2023 Medical Rec #:  [id number]       Height:       60.0 in Accession #:    [id number]      Weight:       175.0 lb Date of Birth:  [birth year]      BSA:          1.764 m Patient Age:    66 years        BP:           164/67 mmHg Patient Gender: F               HR:           74 bpm. Exam Location:  Inpatient Procedure: 2D Echo, Cardiac Doppler and Color Doppler Indications:    CHF-Acute Systolic I50.21  History:        Patient has no prior history of Echocardiogram examinations.                 CHF; Risk Factors:Diabetes and Hypertension.  Sonographer:    Lucendia Herrlich Referring Phys: [id number] MIR M [facility] IMPRESSIONS  1. Left ventricular ejection fraction, by estimation, is 60 to 65%. The left ventricle has normal function. The left ventricle has no regional wall motion abnormalities. Left ventricular diastolic parameters are consistent with Grade II diastolic dysfunction (pseudonormalization). Elevated left ventricular end-diastolic pressure.  2. Right ventricular systolic function is normal. The right ventricular size is normal. There is normal pulmonary artery systolic pressure.  3. Left atrial size was mildly dilated.  4. The mitral valve is normal in  structure. Trivial mitral valve regurgitation. No evidence of mitral stenosis.  5. The aortic valve is tricuspid. Aortic valve regurgitation is not visualized. No aortic stenosis is present.  6. The inferior vena cava is normal in size with greater than 50% respiratory variability, suggesting right atrial pressure of 3 mmHg. FINDINGS  Left Ventricle: Left ventricular ejection fraction, by estimation, is 60 to 65%. The left ventricle has normal function. The left ventricle has no regional wall motion abnormalities. The left ventricular internal cavity size was normal in size. There is  no left ventricular hypertrophy. Left ventricular diastolic parameters are consistent with Grade II diastolic dysfunction (pseudonormalization). Elevated left ventricular end-diastolic pressure. Right Ventricle: The right ventricular size is normal. No increase in right ventricular wall thickness. Right ventricular systolic function is normal. There is normal pulmonary artery systolic pressure. The tricuspid regurgitant velocity is 2.84 m/s, and  with an assumed right atrial pressure of 3 mmHg, the estimated right ventricular systolic pressure is 35.3 mmHg. Left Atrium: Left atrial size was mildly dilated. Right Atrium: Right atrial size was normal in size. Pericardium: There is no evidence of pericardial effusion. Mitral Valve: The mitral valve is normal in structure. Trivial mitral valve regurgitation. No evidence of mitral valve stenosis. Tricuspid Valve: The tricuspid valve is normal in structure. Tricuspid valve regurgitation is trivial. No evidence of tricuspid stenosis. Aortic Valve: The aortic valve is tricuspid. Aortic valve regurgitation is not visualized. No aortic  stenosis is present. Aortic valve peak gradient measures 12.2 mmHg. Pulmonic Valve: The pulmonic valve was normal in structure. Pulmonic valve regurgitation is not visualized. No evidence of pulmonic stenosis. Aorta: The aortic root is normal in size and  structure. Venous: The inferior vena cava is normal in size with greater than 50% respiratory variability, suggesting right atrial pressure of 3 mmHg. IAS/Shunts: No atrial level shunt detected by color flow Doppler.  LEFT VENTRICLE PLAX 2D LVIDd:         4.40 cm   Diastology LVIDs:         2.90 cm   LV e' medial:    8.27 cm/s LV PW:         1.00 cm   LV E/e' medial:  17.4 LV IVS:        0.80 cm   LV e' lateral:   9.03 cm/s LVOT diam:     1.80 cm   LV E/e' lateral: 15.9 LV SV:         66 LV SV Index:   38 LVOT Area:     2.54 cm  RIGHT VENTRICLE             IVC RV S prime:     15.40 cm/s  IVC diam: 2.00 cm TAPSE (M-mode): 2.1 cm LEFT ATRIUM             Index        RIGHT ATRIUM           Index LA diam:        3.70 cm 2.10 cm/m   RA Area:     11.30 cm LA Vol (A2C):   53.6 ml 30.39 ml/m  RA Volume:   21.60 ml  12.25 ml/m LA Vol (A4C):   51.8 ml 29.37 ml/m LA Biplane Vol: 53.6 ml 30.39 ml/m  AORTIC VALVE AV Area (Vmax): 1.85 cm AV Vmax:        175.00 cm/s AV Peak Grad:   12.2 mmHg LVOT Vmax:      127.00 cm/s LVOT Vmean:     82.700 cm/s LVOT VTI:       0.260 m  AORTA Ao Root diam: 2.60 cm Ao Asc diam:  2.40 cm MITRAL VALVE                TRICUSPID VALVE MV Area (PHT): 4.68 cm     TR Peak grad:   32.3 mmHg MV Decel Time: 162 msec     TR Vmax:        284.00 cm/s MR Peak grad: 45.3 mmHg MR Vmax:      336.50 cm/s   SHUNTS MV E velocity: 144.00 cm/s  Systemic VTI:  0.26 m MV A velocity: 124.00 cm/s  Systemic Diam: 1.80 cm MV E/A ratio:  1.16 Chilton Si MD Electronically signed by Chilton Si MD Signature Date/Time: 02/02/2023/3:18:24 PM    Final        Thad Ranger M.D. [facility] 02/03/2023, 2:02 PM  Available via Epic secure chat 7am-7pm After 7 pm, please refer to night coverage provider listed on amion.

## 2023-02-03 NOTE — Progress Notes (Signed)
[facility] Progress Note  Subjective: 900 cc UOP yesterday. BP's stable. Creat stable at 2.0.   Vitals:   02/02/23 0709 02/02/23 1157 02/02/23 2220 02/03/23 0800  BP:  (!) 160/67 (!) 153/67   Pulse:  73 72   Resp:  (!) 21 18 17   Temp:  99.2 F (37.3 C) 99.2 F (37.3 C)   TempSrc:  Oral Oral   SpO2:  95% 96%   Weight: 81 kg     Height:        Exam: Gen alert, no distress No rash, cyanosis or gangrene Sclera anicteric, throat clear  +jvd Chest - rales R base 1/3 up, L clear RRR no MRG Abd soft ntnd no mass or ascites +bs GU defer MS no joint effusions or deformity Ext no LE or UE edema, no wounds or ulcers Neuro is alert, Ox 3 , nf       Renal-related home meds: - gabapentin 300 hs prn  - ibuprofen - irbesartan- hydrochlorothiazide 150-12.5 every day - metoprolol xl 50 hs - norvasc 10 hs      Date                          Creat               eGFR    2011- 2022               0.66- 1.00    Oct 2023                  1.30                 45 ml/min    02/01/23                   1.88                 29 ml/min     02/02/23                  2.09                 25 ml/min       CXR 10/09 - + bilat pulm edema and trace bilat effusions       UNa 63,  UCr 92       UA 10/10 --> prot 100, rare bact, 0-5 rbc/ wcb/ epis      Renal US - 9cm/ 11cm kidneys w/ no hydro          ECHO - pending results   Assessment/ Plan: AKI on CKD 3a - b/l creatinine 1.30 from 2023, eGFR 45 ml/min.  Creat here was 1.8 on admission in the setting of decompensated CHF/ pulm edema. Creat bumped to 2.0 then after getting 24 hrs of IV lasix 40 bid. UA negative, urine lytes nondescript. Suspect AKI due to decompensated CHF.  IV lasix ^'d to 60mg  tid yesterday. Creat stable today, 900 cc UOP. Plan ^lasix to 80mg  tid IV.  Will follow.   Suspected acute heart failure - w/ pulm edema by xray and SOB. Still has rales on L side today. ECHO showing EF 60-65% and G2DD.  Hyperkalemia - resolved DM2  - per pmd     Vinson Moselle MD  [facility] 02/03/2023, 8:25 AM  Recent Labs  Lab 02/01/23 0846 02/01/23 1905 02/02/23 0425 02/03/23 0401  HGB 8.7*  --  7.8* 7.5*  ALBUMIN 3.6  --   --   --  CALCIUM 8.3*  --  8.5* 8.3*  CREATININE 1.88*  --  2.09* 2.09*  K 5.5*   < > 4.2 4.2   < > = values in this interval not displayed.   Recent Labs  Lab 02/02/23 1654  IRON 14*  TIBC 242*  FERRITIN 154   Inpatient medications:  amLODipine  10 mg Oral Daily   carvedilol  6.25 mg Oral BID WC   enoxaparin (LOVENOX) injection  30 mg Subcutaneous Q24H   furosemide  60 mg Intravenous Q8H   insulin aspart  0-15 Units Subcutaneous TID WC   insulin aspart  0-5 Units Subcutaneous QHS    acetaminophen **OR** acetaminophen, albuterol, hydrALAZINE, ondansetron **OR** ondansetron (ZOFRAN) IV, mouth rinse, traZODone

## 2023-02-03 NOTE — Plan of Care (Signed)
  Problem: Nutrition: Goal: Adequate nutrition will be maintained Outcome: Progressing   Problem: Coping: Goal: Level of anxiety will decrease Outcome: Progressing   Problem: Pain Managment: Goal: General experience of comfort will improve Outcome: Progressing   Problem: Safety: Goal: Ability to remain free from injury will improve Outcome: Progressing   

## 2023-02-03 NOTE — Consult Note (Addendum)
Consultation  Referring Provider:  [facility]  Primary Care Physician:  Noberto Retort, MD Primary Gastroenterologist:  Dr. Marina Goodell       Reason for Consultation:     Anemia  LOS: 2 days          HPI:   Kimberly Hunt is a 67 y.o. female with past medical history significant for endometrial cancer s/p hysterectomy, poorly controlled diabetes, hypertension, CHF, presents for evaluation of symptomatic anemia.  Patient initially presented to ED with shortness of breath on exertion and nonproductive cough.  O2 sats in the ED were 83% on room air.  Improved to 91% on 2 LPM.  Workup notable for - Hgb 7.5 (8.7 on admission) was 12.4 01/2022 - B12 203, folate 7.9 - Iron 14, saturation 6%, ferritin 154, TIBC 242 - AKI on CKD with BUN 59, creatinine 2.09 (baseline creatinine 1.3), GFR 25 - Checks x-ray with pulmonary edema and trace bilateral pleural effusions -BNP 661  AKI on CKD thought to be in the setting of decompensated CHF/pulmonary edema.  Nephrology is following.  Patient was put on IV Lasix for volume overload She has not received a transfusion of blood products.  She is hesitant to receive them as she has never had transfusion in the past.  She is agreeable if necessary.  Patient states over the last few weeks she has had intermittent shortness of breath which became progressively worse in the morning she presented to the ED 10/9.  She also reported associated chest pain which was relieved when she was put on oxygen.  Denies melena/hematochezia.  Denies fever.  Denies GERD. denies nausea, vomiting.  Has some mild lower abdominal cramping associated with her chronic constipation.  History of colonoscopy in 2011 with small polyps.  Denies family history of colon cancer.   PREVIOUS GI WORKUP   Colonoscopy 2011 1) Diminutive polyp in the ascending colon - removed 2) Otherwise normal examination  EGD 2006 for dyspepsia and abdominal pain - Normal proximal esophagus to second  portion of duodenum  Past Medical History:  Diagnosis Date   Anxiety    Arthritis    Depression    Diabetes mellitus without complication (HCC)    Family history of breast cancer 02/08/2021   Family history of prostate cancer 02/08/2021   Heart murmur    no problems mild told in her 30's   Hypertension    Neuromuscular disorder (HCC)    neuropathy feet   Pancreatitis     Surgical History:  She  has a past surgical history that includes Cholecystectomy; Colonoscopy w/ polypectomy; Cervical polypectomy; Tonsillectomy; Uterine fibroid surgery; Robotic assisted total hysterectomy with bilateral salpingo oophorectomy (N/A, 01/12/2021); and Sentinel node biopsy (N/A, 01/12/2021). Family History:  Her family history includes Breast cancer in her mother; Cancer in her maternal aunt, maternal uncle, and paternal aunt; Prostate cancer in her paternal uncle. Social History:   reports that she quit smoking about 44 years ago. Her smoking use included cigarettes. She started smoking about 54 years ago. She has never used smokeless tobacco. She reports that she does not drink alcohol and does not use drugs.  Prior to Admission medications   Medication Sig Start Date End Date Taking? Authorizing Provider  amLODipine (NORVASC) 10 MG tablet Take 10 mg by mouth at bedtime. 08/31/20  Yes [provider]  citalopram (CELEXA) 40 MG tablet Take 40 mg by mouth at bedtime.   Yes [provider]  gabapentin (NEURONTIN) 300  MG capsule Take 300 mg by mouth at bedtime as needed (for pain). 08/25/20  Yes [provider]  HYDROcodone-acetaminophen (NORCO) 7.5-325 MG tablet Take 1-2 tablets by mouth See admin instructions. Take 2 tablets by mouth at bedtime and an additional 1 tablet once a day as needed for pain   Yes [provider]  ibuprofen (IBU-200) 200 MG tablet Take 200-400 mg by mouth every 6 (six) hours as needed for mild pain or headache.   Yes [provider]   insulin aspart (NOVOLOG FLEXPEN) 100 UNIT/ML FlexPen Inject 0-20 Units into the skin 3 (three) times daily with meals. 09/04/19  Yes [provider]  irbesartan-hydrochlorothiazide (AVALIDE) 150-12.5 MG tablet Take 1 tablet by mouth every evening. 01/01/21  Yes [provider]  LORazepam (ATIVAN) 0.5 MG tablet Take 0.5 mg by mouth 2 (two) times daily as needed for anxiety. 08/18/20  Yes [provider]  metFORMIN (GLUCOPHAGE-XR) 500 MG 24 hr tablet Take 500 mg by mouth every evening. 12/21/20  Yes [provider]  metoprolol succinate (TOPROL-XL) 50 MG 24 hr tablet Take 50 mg by mouth at bedtime. 01/19/23  Yes [provider]  pantoprazole (PROTONIX) 40 MG tablet Take 40 mg by mouth at bedtime. 01/19/23  Yes [provider]  rosuvastatin (CRESTOR) 10 MG tablet Take 10 mg by mouth every Friday. 01/19/23  Yes [provider]  TOUJEO MAX SOLOSTAR 300 UNIT/ML Solostar Pen Inject 45 Units into the skin every evening. 11/20/20  Yes [provider]  zolpidem (AMBIEN) 10 MG tablet Take 10 mg by mouth at bedtime as needed for sleep. 10/31/16  Yes [provider]  acetaminophen (TYLENOL) 500 MG tablet Take 1 tablet (500 mg total) by mouth every 6 (six) hours as needed. Patient not taking: Reported on 02/01/2023 01/12/21   Adolphus Birchwood, MD  blood glucose meter kit and supplies KIT Dispense based on patient and insurance preference. Use up to four times daily as directed. (FOR ICD-9 250.00, 250.01). 06/04/14   Osvaldo Shipper, MD  ibuprofen (ADVIL) 800 MG tablet Take 1 tablet (800 mg total) by mouth every 8 (eight) hours as needed. Patient not taking: Reported on 02/04/2021 01/12/21   Adolphus Birchwood, MD    Current Facility-Administered Medications  Medication Dose Route Frequency Provider Last Rate Last Admin   0.9 %  sodium chloride infusion (Manually program via Guardrails IV Fluids)   Intravenous Once Rai, Ripudeep K, MD       acetaminophen  (TYLENOL) tablet 650 mg  650 mg Oral Q6H PRN Kirby Crigler, Mir M, MD       Or   acetaminophen (TYLENOL) suppository 650 mg  650 mg Rectal Q6H PRN Kirby Crigler, Mir M, MD       albuterol (PROVENTIL) (2.5 MG/3ML) 0.083% nebulizer solution 2.5 mg  2.5 mg Nebulization Q2H PRN Kirby Crigler, Mir M, MD       amLODipine (NORVASC) tablet 10 mg  10 mg Oral Daily Kirby Crigler, Mir M, MD   10 mg at 02/03/23 0840   carvedilol (COREG) tablet 6.25 mg  6.25 mg Oral BID WC Kirby Crigler, Mir M, MD   6.25 mg at 02/03/23 0840   cyanocobalamin (VITAMIN B12) tablet 1,000 mcg  1,000 mcg Oral Daily Rai, Ripudeep K, MD       folic acid (FOLVITE) tablet 1 mg  1 mg Oral Daily Rai, Ripudeep K, MD       furosemide (LASIX) injection 80 mg  80 mg Intravenous Q8H Delano Metz, MD  hydrALAZINE (APRESOLINE) injection 5 mg  5 mg Intravenous Q6H PRN Kirby Crigler, Mir M, MD       insulin aspart (novoLOG) injection 0-15 Units  0-15 Units Subcutaneous TID WC Kirby Crigler, Mir M, MD   3 Units at 02/03/23 1301   insulin aspart (novoLOG) injection 0-5 Units  0-5 Units Subcutaneous QHS Kirby Crigler, Mir M, MD       ondansetron [facility]) tablet 4 mg  4 mg Oral Q6H PRN Kirby Crigler, Mir M, MD       Or   ondansetron [facility]) injection 4 mg  4 mg Intravenous Q6H PRN Maryln Gottron, MD       Oral care mouth rinse  15 mL Mouth Rinse PRN Kirby Crigler, Mir M, MD       traZODone (DESYREL) tablet 25 mg  25 mg Oral QHS PRN Maryln Gottron, MD        Allergies as of 02/01/2023 - Review Complete 02/01/2023  Allergen Reaction Noted   Shellfish allergy Hives and Cough 12/10/2013    Review of Systems  Constitutional:  Positive for malaise/fatigue. Negative for chills, fever and weight loss.  HENT:  Negative for hearing loss and tinnitus.   Eyes:  Negative for blurred vision.  Respiratory:  Positive for shortness of breath. Negative for cough.   Cardiovascular:  Positive for chest pain. Negative for leg swelling.  Gastrointestinal:  Negative for  abdominal pain, blood in stool, constipation, diarrhea, heartburn, melena, nausea and vomiting.  Genitourinary:  Negative for dysuria and urgency.  Musculoskeletal:  Negative for myalgias and neck pain.  Skin:  Negative for itching and rash.  Neurological:  Negative for seizures and loss of consciousness.  Psychiatric/Behavioral:  Negative for depression and suicidal ideas.        Physical Exam:  Vital signs in last 24 hours: Temp:  [98.4 F (36.9 C)-99.2 F (37.3 C)] 98.4 F (36.9 C) (10/11 1253) Pulse Rate:  [63-72] 63 (10/11 1253) Resp:  [17-19] 19 (10/11 1253) BP: (147-153)/(67-70) 147/70 (10/11 1253) SpO2:  [91 %-96 %] 91 % (10/11 1253) Last BM Date : 02/01/23 Last BM recorded by nurses in past 5 days No data recorded  Physical Exam Constitutional:      Appearance: She is well-developed. She is not ill-appearing.  HENT:     Head: Normocephalic and atraumatic.     Nose: Nose normal.     Comments: On 2 LPM via [place]    Mouth/Throat:     Mouth: Mucous membranes are moist.     Pharynx: Oropharynx is clear.  Eyes:     Extraocular Movements: Extraocular movements intact.     Comments: Conjunctival pallor  Cardiovascular:     Rate and Rhythm: Normal rate and regular rhythm.  Pulmonary:     Effort: Pulmonary effort is normal. No respiratory distress.  Abdominal:     General: Bowel sounds are normal. There is no distension.     Palpations: Abdomen is soft. There is no mass.     Tenderness: There is no abdominal tenderness. There is no guarding or rebound.     Hernia: No hernia is present.  Musculoskeletal:        General: No swelling. Normal range of motion.     Cervical back: Normal range of motion and neck supple.  Skin:    General: Skin is warm and dry.     Coloration: Skin is not jaundiced.  Neurological:     General: No focal deficit present.     Mental Status: She  is oriented to person, place, and time.  Psychiatric:        Mood and Affect: Mood normal.         Behavior: Behavior normal.        Thought Content: Thought content normal.        Judgment: Judgment normal.      LAB RESULTS: Recent Labs    02/01/23 0846 02/02/23 0425 02/03/23 0401  WBC 16.2* 11.6* 8.7  HGB 8.7* 7.8* 7.5*  HCT 26.5* 24.5* 23.1*  PLT 339 294 314   BMET Recent Labs    02/01/23 0846 02/01/23 1905 02/02/23 0425 02/03/23 0401  NA 134*  --  137 136  K 5.5* 5.3* 4.2 4.2  CL 104  --  103 101  CO2 22  --  22 24  GLUCOSE 110*  --  115* 137*  BUN 45*  --  54* 59*  CREATININE 1.88*  --  2.09* 2.09*  CALCIUM 8.3*  --  8.5* 8.3*   LFT Recent Labs    02/01/23 0846  PROT 7.4  ALBUMIN 3.6  AST 28  ALT 26  ALKPHOS 68  BILITOT 1.0   PT/INR No results for input(s): "LABPROT", "INR" in the last 72 hours.  STUDIES: US RENAL  Result Date: 02/02/2023 CLINICAL DATA:  Acute kidney injury EXAM: RENAL / URINARY TRACT ULTRASOUND COMPLETE COMPARISON:  CT 02/08/2022 FINDINGS: Right Kidney: Renal measurements: 9.2 x 4.1 x 4.5 cm = volume: 89.6 mL. Echogenicity within normal limits. No mass or hydronephrosis visualized. Left Kidney: Renal measurements: 11.3 x 5.2 x 5.2 cm = volume: 161.2 mL. Echogenicity within normal limits. No mass or hydronephrosis visualized. Bladder: Appears normal for degree of bladder distention. 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 IMPRESSION: No collecting system dilatation. Electronically Signed   By: Karen Kays M.D.   On: 02/02/2023 19:55   ECHOCARDIOGRAM COMPLETE  Result Date: 02/02/2023    ECHOCARDIOGRAM REPORT   Patient Name:   Kimberly Hunt Date of Exam: 02/02/2023 Medical Rec #:  [id number]       Height:       60.0 in Accession #:    [id number]      Weight:       175.0 lb Date of Birth:  10/27/1955      BSA:          1.764 m Patient Age:    66 years        BP:           164/67 mmHg Patient Gender: F               HR:           74 bpm. Exam Location:  Inpatient Procedure: 2D Echo, Cardiac Doppler and Color Doppler  Indications:    CHF-Acute Systolic I50.21  History:        Patient has no prior history of Echocardiogram examinations.                 CHF; Risk Factors:Diabetes and Hypertension.  Sonographer:    Lucendia Herrlich Referring Phys: [id number] MIR M [facility] IMPRESSIONS  1. Left ventricular ejection fraction, by estimation, is 60 to 65%. The left ventricle has normal function. The left ventricle has no regional wall motion abnormalities. Left ventricular diastolic parameters are consistent with Grade II diastolic dysfunction (pseudonormalization). Elevated left ventricular end-diastolic pressure.  2. Right ventricular systolic function is normal. The right ventricular size is normal. There is normal pulmonary artery systolic pressure.  3. Left atrial size was mildly dilated.  4. The mitral valve is normal in structure. Trivial mitral valve regurgitation. No evidence of mitral stenosis.  5. The aortic valve is tricuspid. Aortic valve regurgitation is not visualized. No aortic stenosis is present.  6. The inferior vena cava is normal in size with greater than 50% respiratory variability, suggesting right atrial pressure of 3 mmHg. FINDINGS  Left Ventricle: Left ventricular ejection fraction, by estimation, is 60 to 65%. The left ventricle has normal function. The left ventricle has no regional wall motion abnormalities. The left ventricular internal cavity size was normal in size. There is  no left ventricular hypertrophy. Left ventricular diastolic parameters are consistent with Grade II diastolic dysfunction (pseudonormalization). Elevated left ventricular end-diastolic pressure. Right Ventricle: The right ventricular size is normal. No increase in right ventricular wall thickness. Right ventricular systolic function is normal. There is normal pulmonary artery systolic pressure. The tricuspid regurgitant velocity is 2.84 m/s, and  with an assumed right atrial pressure of 3 mmHg, the estimated right ventricular systolic  pressure is 35.3 mmHg. Left Atrium: Left atrial size was mildly dilated. Right Atrium: Right atrial size was normal in size. Pericardium: There is no evidence of pericardial effusion. Mitral Valve: The mitral valve is normal in structure. Trivial mitral valve regurgitation. No evidence of mitral valve stenosis. Tricuspid Valve: The tricuspid valve is normal in structure. Tricuspid valve regurgitation is trivial. No evidence of tricuspid stenosis. Aortic Valve: The aortic valve is tricuspid. Aortic valve regurgitation is not visualized. No aortic stenosis is present. Aortic valve peak gradient measures 12.2 mmHg. Pulmonic Valve: The pulmonic valve was normal in structure. Pulmonic valve regurgitation is not visualized. No evidence of pulmonic stenosis. Aorta: The aortic root is normal in size and structure. Venous: The inferior vena cava is normal in size with greater than 50% respiratory variability, suggesting right atrial pressure of 3 mmHg. IAS/Shunts: No atrial level shunt detected by color flow Doppler.  LEFT VENTRICLE PLAX 2D LVIDd:         4.40 cm   Diastology LVIDs:         2.90 cm   LV e' medial:    8.27 cm/s LV PW:         1.00 cm   LV E/e' medial:  17.4 LV IVS:        0.80 cm   LV e' lateral:   9.03 cm/s LVOT diam:     1.80 cm   LV E/e' lateral: 15.9 LV SV:         66 LV SV Index:   38 LVOT Area:     2.54 cm  RIGHT VENTRICLE             IVC RV S prime:     15.40 cm/s  IVC diam: 2.00 cm TAPSE (M-mode): 2.1 cm LEFT ATRIUM             Index        RIGHT ATRIUM           Index LA diam:        3.70 cm 2.10 cm/m   RA Area:     11.30 cm LA Vol (A2C):   53.6 ml 30.39 ml/m  RA Volume:   21.60 ml  12.25 ml/m LA Vol (A4C):   51.8 ml 29.37 ml/m LA Biplane Vol: 53.6 ml 30.39 ml/m  AORTIC VALVE AV Area (Vmax): 1.85 cm AV Vmax:        175.00 cm/s AV  Peak Grad:   12.2 mmHg LVOT Vmax:      127.00 cm/s LVOT Vmean:     82.700 cm/s LVOT VTI:       0.260 m  AORTA Ao Root diam: 2.60 cm Ao Asc diam:  2.40 cm MITRAL  VALVE                TRICUSPID VALVE MV Area (PHT): 4.68 cm     TR Peak grad:   32.3 mmHg MV Decel Time: 162 msec     TR Vmax:        284.00 cm/s MR Peak grad: 45.3 mmHg MR Vmax:      336.50 cm/s   SHUNTS MV E velocity: 144.00 cm/s  Systemic VTI:  0.26 m MV A velocity: 124.00 cm/s  Systemic Diam: 1.80 cm MV E/A ratio:  1.16 Chilton Si MD Electronically signed by Chilton Si MD Signature Date/Time: 02/02/2023/3:18:24 PM    Final       Impression    Symptomatic anemia Colonoscopy in 2011 with diminutive polyps Hgb 7.5 (8.7 on admission) with 12.5 baseline 2023 B12 203, folate 7.9 Iron 14, saturation 6%, ferritin 154, TIBC 242 Unclear etiology, suspect multifactorial with borderline B12, iron deficiency, and CKD.  No overt GI bleeding.  No Hemoccult has been collected.  There is no doubt she is due for an endoscopic procedure, however, may need to wait until pulmonary status improves prior to proceeding with a EGD/colonoscopy in the absence of overt bleeding.  Acute respiratory failure secondary to CHF Chest x-ray with pulmonary edema and trace bilateral pleural effusions BNP 661 On IV lasix and 2LPM via [place]  AKI on CKD in the setting of volume overload from CHF exacerbation BUN 59, creatinine 2.09 (baseline creatinine 1.3), GFR 25 Nephrology following  Chronic constipation On chronic opioids. Could benefit from regular bowel regimen.   Plan   - Consider EGD/colonoscopy for further evaluation, timing TBD based on how she improves from cardio/pulm standpoint - Continue daily CBC and transfuse as needed to maintain HGB > 7 - Continue supportive care -- Miralax every day   Thank you for your kind consultation, we will continue to follow.  Bayley Leanna Sato  02/03/2023, 2:19 PM     Attending Physician Note   I have taken a history, reviewed the chart and examined the patient. I performed a substantive portion of this encounter, including complete performance of at least  one of the key components, in conjunction with the APP. I agree with the APP's note, impression and recommendations with my edits. My additional impressions and recommendations are as follows.   Iron deficiency anemia. B12 deficiency and CKD may also be contributing to her anemia. R/O colorectal neoplasms, AVMs, ulcer, etc. Colonoscopy and EGD when respiratory failure and CHF have resolved - end of hospital stay or as outpatient with Dr. Marina Goodell. Trend CBC. Consider IV Fe defer to primary service. GI will see again on Monday.  CHF with acute respiratory failure on O2 [place]  AKI on CKD  Chronic constipation. Miralax qd to bid   Claudette Head, MD [facility] See AMION, [facility], for our on call provider

## 2023-02-04 ENCOUNTER — Inpatient Hospital Stay (HOSPITAL_COMMUNITY): Payer: Medicare PPO

## 2023-02-04 DIAGNOSIS — C541 Malignant neoplasm of endometrium: Secondary | ICD-10-CM | POA: Diagnosis not present

## 2023-02-04 DIAGNOSIS — D649 Anemia, unspecified: Secondary | ICD-10-CM | POA: Diagnosis not present

## 2023-02-04 DIAGNOSIS — N179 Acute kidney failure, unspecified: Secondary | ICD-10-CM | POA: Diagnosis not present

## 2023-02-04 DIAGNOSIS — I5020 Unspecified systolic (congestive) heart failure: Secondary | ICD-10-CM | POA: Diagnosis not present

## 2023-02-04 LAB — BASIC METABOLIC PANEL
Anion gap: 14 (ref 5–15)
BUN: 66 mg/dL — ABNORMAL HIGH (ref 8–23)
CO2: 25 mmol/L (ref 22–32)
Calcium: 8.6 mg/dL — ABNORMAL LOW (ref 8.9–10.3)
Chloride: 99 mmol/L (ref 98–111)
Creatinine, Ser: 2 mg/dL — ABNORMAL HIGH (ref 0.44–1.00)
GFR, Estimated: 27 mL/min — ABNORMAL LOW (ref >60)
Glucose, Bld: 128 mg/dL — ABNORMAL HIGH (ref 70–99)
Potassium: 3.9 mmol/L (ref 3.5–5.1)
Sodium: 138 mmol/L (ref 135–145)

## 2023-02-04 LAB — GLUCOSE, CAPILLARY
Glucose-Capillary: 127 mg/dL — ABNORMAL HIGH (ref 70–99)
Glucose-Capillary: 155 mg/dL — ABNORMAL HIGH (ref 70–99)
Glucose-Capillary: 164 mg/dL — ABNORMAL HIGH (ref 70–99)
Glucose-Capillary: 175 mg/dL — ABNORMAL HIGH (ref 70–99)

## 2023-02-04 LAB — CBC
HCT: 28.5 % — ABNORMAL LOW (ref 36.0–46.0)
Hemoglobin: 9.6 g/dL — ABNORMAL LOW (ref 12.0–15.0)
MCH: 29.9 pg (ref 26.0–34.0)
MCHC: 33.7 g/dL (ref 30.0–36.0)
MCV: 88.8 fL (ref 80.0–100.0)
Platelets: 383 10*3/uL (ref 150–400)
RBC: 3.21 MIL/uL — ABNORMAL LOW (ref 3.87–5.11)
RDW: 12.5 % (ref 11.5–15.5)
WBC: 9 10*3/uL (ref 4.0–10.5)
nRBC: 0 % (ref 0.0–0.2)

## 2023-02-04 MED ORDER — SODIUM CHLORIDE 0.9 % IV SOLN
100.0000 mg | Freq: Once | INTRAVENOUS | Status: AC
  Administered 2023-02-04: 100 mg via INTRAVENOUS
  Filled 2023-02-04: qty 5

## 2023-02-04 MED ORDER — FUROSEMIDE 10 MG/ML IJ SOLN
100.0000 mg | Freq: Three times a day (TID) | INTRAVENOUS | Status: DC
  Administered 2023-02-04 – 2023-02-05 (×3): 100 mg via INTRAVENOUS
  Filled 2023-02-04 (×5): qty 10

## 2023-02-04 NOTE — Progress Notes (Signed)
Patient ambulated 5 minutes on RA with SpO2 noted @93 % and no respiratory distress.

## 2023-02-04 NOTE — Plan of Care (Signed)
Problem: Education: Goal: Ability to describe self-care measures that may prevent or decrease complications (Diabetes Survival Skills Education) will improve Outcome: Progressing   Problem: Coping: Goal: Ability to adjust to condition or change in health will improve Outcome: Progressing   Problem: Fluid Volume: Goal: Ability to maintain a balanced intake and output will improve Outcome: Progressing   Problem: Health Behavior/Discharge Planning: Goal: Ability to identify and utilize available resources and services will improve Outcome: Progressing Goal: Ability to manage health-related needs will improve Outcome: Progressing   Problem: Metabolic: Goal: Ability to maintain appropriate glucose levels will improve Outcome: Progressing   Problem: Nutritional: Goal: Maintenance of adequate nutrition will improve Outcome: Progressing Goal: Progress toward achieving an optimal weight will improve Outcome: Progressing   Problem: Skin Integrity: Goal: Risk for impaired skin integrity will decrease Outcome: Progressing   Problem: Tissue Perfusion: Goal: Adequacy of tissue perfusion will improve Outcome: Progressing   Problem: Education: Goal: Knowledge of General Education information will improve Description: Including pain rating scale, medication(s)/side effects and non-pharmacologic comfort measures Outcome: Progressing   Problem: Health Behavior/Discharge Planning: Goal: Ability to manage health-related needs will improve Outcome: Progressing   Problem: Clinical Measurements: Goal: Ability to maintain clinical measurements within normal limits will improve Outcome: Progressing Goal: Will remain free from infection Outcome: Progressing Goal: Diagnostic test results will improve Outcome: Progressing Goal: Respiratory complications will improve Outcome: Progressing Goal: Cardiovascular complication will be avoided Outcome: Progressing   Problem: Activity: Goal:  Risk for activity intolerance will decrease Outcome: Progressing   Problem: Nutrition: Goal: Adequate nutrition will be maintained Outcome: Progressing   Problem: Coping: Goal: Level of anxiety will decrease Outcome: Progressing   Problem: Elimination: Goal: Will not experience complications related to bowel motility Outcome: Progressing Goal: Will not experience complications related to urinary retention Outcome: Progressing   Problem: Pain Managment: Goal: General experience of comfort will improve Outcome: Progressing   Problem: Safety: Goal: Ability to remain free from injury will improve Outcome: Progressing   Problem: Skin Integrity: Goal: Risk for impaired skin integrity will decrease Outcome: Progressing   Problem: Education: Goal: Ability to demonstrate management of disease process will improve Outcome: Progressing Goal: Ability to verbalize understanding of medication therapies will improve Outcome: Progressing Goal: Individualized Educational Video(s) Outcome: Progressing   Problem: Activity: Goal: Capacity to carry out activities will improve Outcome: Progressing   Problem: Cardiac: Goal: Ability to achieve and maintain adequate cardiopulmonary perfusion will improve Outcome: Progressing

## 2023-02-04 NOTE — Progress Notes (Signed)
[facility]                                                                              Kimberly Hunt, is a 67 y.o. female, DOB - May 06, 1955, ZOX:[id number] Admit date - 02/01/2023    Outpatient Primary MD for the patient is [name], MD  LOS - 3  days  Chief Complaint  Patient presents with   Shortness of Breath            Brief summary   Patient is a 67 year old female with hypertension, diabetes mellitus, poorly controlled, endometrial CA status post hysterectomy presented with shortness of breath and nonproductive cough.  Patient reported that she was in her usual state of health until a day before when she started noticing some dyspnea with exertion, no lower extremity edema chest pain, palpitations or fevers.  She reported having a hard time laying flat, felt short of breath and some chest pressure.  She felt abdominal fullness. In ED, was noted to be hypoxic with O2 sats 83% on room air.  BP 152/66, pulse 63, RR 20 Sats improved to 91% on 2 L  Assessment & Plan    Acute respiratory failure with hypoxia secondary to pulmonary edema, acute CHF (congestive heart failure) (HCC) -No prior history of CHF, presented with dyspnea, apnea, elevated BNP 661, hypoxia.  Chest x-ray showed pulmonary edema and trace bilateral pleural effusions -Patient started on IV Lasix 40 mg twice daily, creatinine trended up to 2.0 -Nephrology consulted, increased IV Lasix to 80 mg every 8 hours, patient tolerating well, creatinine remains stable at 2.0   Active Problems:    Acute kidney injury superimposed on chronic kidney disease, 3a (HCC), hyperkalemia -Creatinine 1.3 with GFR 45 on 02/08/2022, presented with creatinine of 1.8, -Worsened to 2.0, nephrology consulted -Renal ultrasound showed no obstruction or hydronephrosis -Creatinine remains stable, 2.0 on high-dose IV Lasix diuresis   Normocytic anemia, appears acute Borderline B12, iron deficiency -Unclear  etiology, no reports of GI bleeding, hemoglobin 12.4 on 02/08/2022, presented with hemoglobin of 8.7-> 7.5 on 10/11 -s/p 1 unit packed RBCs on 10/11, hemoglobin 9.6 today -B12 203, folate 7.9, iron panel showed Fe 14, percent saturation ratio 6, ferritin 154, TIBC 242 -Has history of GERD, per patient no recent use of any NSAIDs. -GI consulted, plan for endoscopy once pulm status improved -Continue B12, folic acid replacement, placed on IV iron x 1 -FOBT still pending, asked RN to send the sample     Type 2 diabetes mellitus with hyperglycemia (HCC) Hemoglobin A1c 6.0 CBG (last 3)  Recent Labs    02/03/23 1647 02/03/23 2041 02/04/23 0744  GLUCAP 115* 178* 127*  Continue sliding scale insulin      Endometrial cancer (HCC), stage I grade 1 -Status post total hysterectomy with bilateral salpingo-oophorectomy in 12/2020 Outpatient follow-up with Gyn-onc   Obesity Estimated body mass index is 34.08 kg/m as calculated from the following:   Height as of this encounter: 5' (1.524 m).   Weight as of this encounter: 79.2 kg.  Code Status: full code  DVT Prophylaxis:  Place and maintain sequential compression device Start: 02/03/23  1402 SCDs Start: 02/01/23 1034   Level of Care: Level of care: Telemetry Family Communication: Updated patient Disposition Plan:      Remains inpatient appropriate: Workup in progress   Procedures:    Consultants:   Renal  GI Antimicrobials:   Anti-infectives (From admission, onward)    None          Medications  sodium chloride   Intravenous Once   amLODipine  10 mg Oral Daily   carvedilol  6.25 mg Oral BID WC   vitamin B-12  1,000 mcg Oral Daily   folic acid  1 mg Oral Daily   furosemide  80 mg Intravenous Q8H   insulin aspart  0-15 Units Subcutaneous TID WC   insulin aspart  0-5 Units Subcutaneous QHS      Subjective:   Kimberly Hunt was seen and examined today.  Feeling better, no acute chest pain or shortness of breath,  overall improving.   Objective:   Vitals:   02/03/23 2337 02/04/23 0120 02/04/23 0500 02/04/23 0528  BP: (!) 147/59 (!) 151/62  (!) 141/52  Pulse: (!) 57 (!) 58  62  Resp: 18 20  18   Temp: 98.4 F (36.9 C) 98.6 F (37 C)  98.5 F (36.9 C)  TempSrc: Oral Oral  Oral  SpO2: 96% 97%  96%  Weight:   79.2 kg   Height:        Intake/Output Summary (Last 24 hours) at 02/04/2023 1103 Last data filed at 02/04/2023 0130 Gross per 24 hour  Intake 1050 ml  Output --  Net 1050 ml     Wt Readings from Last 3 Encounters:  02/04/23 79.2 kg  06/07/22 79.4 kg  02/04/21 87.1 kg   Physical Exam General: Alert and oriented x 3, NAD Cardiovascular: S1 S2 clear, RRR.  Respiratory: Diminished breath sound at the bases, no wheezing Gastrointestinal: Soft, nontender, nondistended, NBS Ext: no pedal edema bilaterally Neuro: no new deficits Psych: Normal affect    Data Reviewed:  I have personally reviewed following labs    CBC Lab Results  Component Value Date   WBC 9.0 02/04/2023   RBC 3.21 (L) 02/04/2023   HGB 9.6 (L) 02/04/2023   HCT 28.5 (L) 02/04/2023   MCV 88.8 02/04/2023   MCH 29.9 02/04/2023   PLT 383 02/04/2023   MCHC 33.7 02/04/2023   RDW 12.5 02/04/2023   LYMPHSABS 1.4 02/01/2023   MONOABS 1.1 (H) 02/01/2023   EOSABS 0.0 02/01/2023   BASOSABS 0.1 02/01/2023     Last metabolic panel Lab Results  Component Value Date   NA 138 02/04/2023   K 3.9 02/04/2023   CL 99 02/04/2023   CO2 25 02/04/2023   BUN 66 (H) 02/04/2023   CREATININE 2.00 (H) 02/04/2023   GLUCOSE 128 (H) 02/04/2023   GFRNONAA 27 (L) 02/04/2023   GFRAA >60 12/23/2016   CALCIUM 8.6 (L) 02/04/2023   PROT 7.4 02/01/2023   ALBUMIN 3.6 02/01/2023   BILITOT 1.0 02/01/2023   ALKPHOS 68 02/01/2023   AST 28 02/01/2023   ALT 26 02/01/2023   ANIONGAP 14 02/04/2023    CBG (last 3)  Recent Labs    02/03/23 1647 02/03/23 2041 02/04/23 0744  GLUCAP 115* 178* 127*      Coagulation Profile: No  results for input(s): "INR", "PROTIME" in the last 168 hours.   Radiology Studies: I have personally reviewed the imaging studies  US RENAL  Result Date: 02/02/2023 CLINICAL DATA:  Acute kidney injury EXAM: RENAL /  URINARY TRACT ULTRASOUND COMPLETE COMPARISON:  CT 02/08/2022 FINDINGS: Right Kidney: Renal measurements: 9.2 x 4.1 x 4.5 cm = volume: 89.6 mL. Echogenicity within normal limits. No mass or hydronephrosis visualized. Left Kidney: Renal measurements: 11.3 x 5.2 x 5.2 cm = volume: 161.2 mL. Echogenicity within normal limits. No mass or hydronephrosis visualized. Bladder: Appears normal for degree of bladder distention. 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 IMPRESSION: No collecting system dilatation. Electronically Signed   By: Karen Kays M.D.   On: 02/02/2023 19:55   ECHOCARDIOGRAM COMPLETE  Result Date: 02/02/2023    ECHOCARDIOGRAM REPORT   Patient Name:   ANELISE ULATOWSKI Date of Exam: 02/02/2023 Medical Rec #:  [id number]       Height:       60.0 in Accession #:    [id number]      Weight:       175.0 lb Date of Birth:  07-Oct-1955      BSA:          1.764 m Patient Age:    66 years        BP:           164/67 mmHg Patient Gender: F               HR:           74 bpm. Exam Location:  Inpatient Procedure: 2D Echo, Cardiac Doppler and Color Doppler Indications:    CHF-Acute Systolic I50.21  History:        Patient has no prior history of Echocardiogram examinations.                 CHF; Risk Factors:Diabetes and Hypertension.  Sonographer:    Lucendia Herrlich Referring Phys: [id number] MIR M [facility] IMPRESSIONS  1. Left ventricular ejection fraction, by estimation, is 60 to 65%. The left ventricle has normal function. The left ventricle has no regional wall motion abnormalities. Left ventricular diastolic parameters are consistent with Grade II diastolic dysfunction (pseudonormalization). Elevated left ventricular end-diastolic pressure.  2. Right ventricular systolic  function is normal. The right ventricular size is normal. There is normal pulmonary artery systolic pressure.  3. Left atrial size was mildly dilated.  4. The mitral valve is normal in structure. Trivial mitral valve regurgitation. No evidence of mitral stenosis.  5. The aortic valve is tricuspid. Aortic valve regurgitation is not visualized. No aortic stenosis is present.  6. The inferior vena cava is normal in size with greater than 50% respiratory variability, suggesting right atrial pressure of 3 mmHg. FINDINGS  Left Ventricle: Left ventricular ejection fraction, by estimation, is 60 to 65%. The left ventricle has normal function. The left ventricle has no regional wall motion abnormalities. The left ventricular internal cavity size was normal in size. There is  no left ventricular hypertrophy. Left ventricular diastolic parameters are consistent with Grade II diastolic dysfunction (pseudonormalization). Elevated left ventricular end-diastolic pressure. Right Ventricle: The right ventricular size is normal. No increase in right ventricular wall thickness. Right ventricular systolic function is normal. There is normal pulmonary artery systolic pressure. The tricuspid regurgitant velocity is 2.84 m/s, and  with an assumed right atrial pressure of 3 mmHg, the estimated right ventricular systolic pressure is 35.3 mmHg. Left Atrium: Left atrial size was mildly dilated. Right Atrium: Right atrial size was normal in size. Pericardium: There is no evidence of pericardial effusion. Mitral Valve: The mitral valve is normal in structure. Trivial mitral valve regurgitation. No evidence of mitral valve stenosis.  Tricuspid Valve: The tricuspid valve is normal in structure. Tricuspid valve regurgitation is trivial. No evidence of tricuspid stenosis. Aortic Valve: The aortic valve is tricuspid. Aortic valve regurgitation is not visualized. No aortic stenosis is present. Aortic valve peak gradient measures 12.2 mmHg. Pulmonic  Valve: The pulmonic valve was normal in structure. Pulmonic valve regurgitation is not visualized. No evidence of pulmonic stenosis. Aorta: The aortic root is normal in size and structure. Venous: The inferior vena cava is normal in size with greater than 50% respiratory variability, suggesting right atrial pressure of 3 mmHg. IAS/Shunts: No atrial level shunt detected by color flow Doppler.  LEFT VENTRICLE PLAX 2D LVIDd:         4.40 cm   Diastology LVIDs:         2.90 cm   LV e' medial:    8.27 cm/s LV PW:         1.00 cm   LV E/e' medial:  17.4 LV IVS:        0.80 cm   LV e' lateral:   9.03 cm/s LVOT diam:     1.80 cm   LV E/e' lateral: 15.9 LV SV:         66 LV SV Index:   38 LVOT Area:     2.54 cm  RIGHT VENTRICLE             IVC RV S prime:     15.40 cm/s  IVC diam: 2.00 cm TAPSE (M-mode): 2.1 cm LEFT ATRIUM             Index        RIGHT ATRIUM           Index LA diam:        3.70 cm 2.10 cm/m   RA Area:     11.30 cm LA Vol (A2C):   53.6 ml 30.39 ml/m  RA Volume:   21.60 ml  12.25 ml/m LA Vol (A4C):   51.8 ml 29.37 ml/m LA Biplane Vol: 53.6 ml 30.39 ml/m  AORTIC VALVE AV Area (Vmax): 1.85 cm AV Vmax:        175.00 cm/s AV Peak Grad:   12.2 mmHg LVOT Vmax:      127.00 cm/s LVOT Vmean:     82.700 cm/s LVOT VTI:       0.260 m  AORTA Ao Root diam: 2.60 cm Ao Asc diam:  2.40 cm MITRAL VALVE                TRICUSPID VALVE MV Area (PHT): 4.68 cm     TR Peak grad:   32.3 mmHg MV Decel Time: 162 msec     TR Vmax:        284.00 cm/s MR Peak grad: 45.3 mmHg MR Vmax:      336.50 cm/s   SHUNTS MV E velocity: 144.00 cm/s  Systemic VTI:  0.26 m MV A velocity: 124.00 cm/s  Systemic Diam: 1.80 cm MV E/A ratio:  1.16 Chilton Si MD Electronically signed by Chilton Si MD Signature Date/Time: 02/02/2023/3:18:24 PM    Final        Thad Ranger M.D. [facility] 02/04/2023, 11:03 AM  Available via Epic secure chat 7am-7pm After 7 pm, please refer to night coverage provider listed on amion.

## 2023-02-04 NOTE — Progress Notes (Signed)
[facility] Progress Note  Subjective: not all UOP being collected. SOB better. F/u CXR today much better.   Vitals:   02/03/23 2337 02/04/23 0120 02/04/23 0500 02/04/23 0528  BP: (!) 147/59 (!) 151/62  (!) 141/52  Pulse: (!) 57 (!) 58  62  Resp: 18 20  18   Temp: 98.4 F (36.9 C) 98.6 F (37 C)  98.5 F (36.9 C)  TempSrc: Oral Oral  Oral  SpO2: 96% 97%  96%  Weight:   79.2 kg   Height:        Exam: Gen alert, no distress No rash, cyanosis or gangrene Sclera anicteric, throat clear  Chest - no rales today RRR no MRG Abd soft ntnd no mass or ascites +bs GU defer MS no joint effusions or deformity Ext no LE or UE edema Neuro is alert, Ox 3 , nf       Renal-related home meds: - gabapentin 300 hs prn  - ibuprofen - irbesartan- hydrochlorothiazide 150-12.5 every day - metoprolol xl 50 hs - norvasc 10 hs      Date                          Creat               eGFR    2011- 2022               0.66- 1.00    Oct 2023                  1.30                 45 ml/min    02/01/23                   1.88                 29 ml/min     02/02/23                  2.09                 25 ml/min       CXR 10/09 - + bilat pulm edema and trace bilat effusions       UNa 63,  UCr 92       UA 10/10 --> prot 100, rare bact, 0-5 rbc/ wcb/ epis      Renal US - 9cm/ 11cm kidneys w/ no hydro          ECHO - pending results   Assessment/ Plan: AKI on CKD 3a - b/l creatinine 1.30 from 2023, eGFR 45 ml/min.  Creat here was 1.8 on admission in the setting of decompensated CHF/ pulm edema. Creat bumped to 2.0 then after getting 24 hrs of IV lasix 40 bid. UA negative, urine lytes nondescript. Suspect AKI due to decompensated CHF.  IV lasix ^'d to 60 tid then 80 tid. Better today, SOB down and repeat CXR shows clearing of edema. Cont IV lasix and ^ to 100 tid, ambulatory sat's, monitor all UOP.  D/C soon hopefully.  Suspected acute heart failure - w/ pulm edema by xray and SOB. Still has  rales on L side today. ECHO showing EF 60-65% and G2DD.  Hyperkalemia - resolved DM2 - per pmd     Vinson Moselle MD  [facility] 02/04/2023, 11:38 AM  Recent Labs  Lab 02/01/23 0846 02/01/23 1905 02/03/23 0401 02/04/23 0405  02/04/23 0631  HGB 8.7*   < > 7.5* 9.6*  --   ALBUMIN 3.6  --   --   --   --   CALCIUM 8.3*   < > 8.3*  --  8.6*  CREATININE 1.88*   < > 2.09*  --  2.00*  K 5.5*   < > 4.2  --  3.9   < > = values in this interval not displayed.   Recent Labs  Lab 02/02/23 1654  IRON 14*  TIBC 242*  FERRITIN 154   Inpatient medications:  sodium chloride   Intravenous Once   amLODipine  10 mg Oral Daily   carvedilol  6.25 mg Oral BID WC   vitamin B-12  1,000 mcg Oral Daily   folic acid  1 mg Oral Daily   furosemide  80 mg Intravenous Q8H   insulin aspart  0-15 Units Subcutaneous TID WC   insulin aspart  0-5 Units Subcutaneous QHS    iron sucrose     acetaminophen **OR** acetaminophen, albuterol, hydrALAZINE, ondansetron **OR** ondansetron (ZOFRAN) IV, mouth rinse, traZODone

## 2023-02-05 DIAGNOSIS — C541 Malignant neoplasm of endometrium: Secondary | ICD-10-CM | POA: Diagnosis not present

## 2023-02-05 DIAGNOSIS — I5020 Unspecified systolic (congestive) heart failure: Secondary | ICD-10-CM | POA: Diagnosis not present

## 2023-02-05 DIAGNOSIS — D649 Anemia, unspecified: Secondary | ICD-10-CM | POA: Diagnosis not present

## 2023-02-05 DIAGNOSIS — N179 Acute kidney failure, unspecified: Secondary | ICD-10-CM | POA: Diagnosis not present

## 2023-02-05 LAB — CBC
HCT: 31.9 % — ABNORMAL LOW (ref 36.0–46.0)
Hemoglobin: 10.6 g/dL — ABNORMAL LOW (ref 12.0–15.0)
MCH: 29.8 pg (ref 26.0–34.0)
MCHC: 33.2 g/dL (ref 30.0–36.0)
MCV: 89.6 fL (ref 80.0–100.0)
Platelets: 403 10*3/uL — ABNORMAL HIGH (ref 150–400)
RBC: 3.56 MIL/uL — ABNORMAL LOW (ref 3.87–5.11)
RDW: 12.7 % (ref 11.5–15.5)
WBC: 9.5 10*3/uL (ref 4.0–10.5)
nRBC: 0 % (ref 0.0–0.2)

## 2023-02-05 LAB — BASIC METABOLIC PANEL
Anion gap: 11 (ref 5–15)
BUN: 68 mg/dL — ABNORMAL HIGH (ref 8–23)
CO2: 27 mmol/L (ref 22–32)
Calcium: 8.8 mg/dL — ABNORMAL LOW (ref 8.9–10.3)
Chloride: 100 mmol/L (ref 98–111)
Creatinine, Ser: 2.09 mg/dL — ABNORMAL HIGH (ref 0.44–1.00)
GFR, Estimated: 25 mL/min — ABNORMAL LOW (ref >60)
Glucose, Bld: 174 mg/dL — ABNORMAL HIGH (ref 70–99)
Potassium: 4 mmol/L (ref 3.5–5.1)
Sodium: 138 mmol/L (ref 135–145)

## 2023-02-05 LAB — OCCULT BLOOD X 1 CARD TO LAB, STOOL: Fecal Occult Bld: NEGATIVE

## 2023-02-05 LAB — GLUCOSE, CAPILLARY
Glucose-Capillary: 160 mg/dL — ABNORMAL HIGH (ref 70–99)
Glucose-Capillary: 155 mg/dL — ABNORMAL HIGH (ref 70–99)
Glucose-Capillary: 254 mg/dL — ABNORMAL HIGH (ref 70–99)
Glucose-Capillary: 132 mg/dL — ABNORMAL HIGH (ref 70–99)

## 2023-02-05 MED ORDER — INSULIN ASPART 100 UNIT/ML IJ SOLN
3.0000 [IU] | Freq: Three times a day (TID) | INTRAMUSCULAR | Status: DC
  Administered 2023-02-05 – 2023-02-06 (×2): 3 [IU] via SUBCUTANEOUS

## 2023-02-05 MED ORDER — FUROSEMIDE 40 MG PO TABS
40.0000 mg | ORAL_TABLET | Freq: Two times a day (BID) | ORAL | Status: DC
  Administered 2023-02-05 – 2023-02-06 (×2): 40 mg via ORAL
  Filled 2023-02-05 (×2): qty 1

## 2023-02-05 MED ORDER — FUROSEMIDE 40 MG PO TABS: 40.0000 mg | ORAL_TABLET | Freq: Every day | ORAL | Status: DC

## 2023-02-05 MED ORDER — FUROSEMIDE 10 MG/ML IJ SOLN
100.0000 mg | Freq: Three times a day (TID) | INTRAVENOUS | Status: AC
  Filled 2023-02-05: qty 10

## 2023-02-05 NOTE — Plan of Care (Signed)
Problem: Fluid Volume: Goal: Ability to maintain a balanced intake and output will improve Outcome: Progressing   Problem: Metabolic: Goal: Ability to maintain appropriate glucose levels will improve Outcome: Progressing   Problem: Nutritional: Goal: Maintenance of adequate nutrition will improve Outcome: Progressing Goal: Progress toward achieving an optimal weight will improve Outcome: Progressing

## 2023-02-05 NOTE — Progress Notes (Signed)
[facility] Progress Note  Subjective: UOP 2.1 L yesterday and 950 cc today. Pt feeling better. Was off O2 but now back on O2.   Vitals:   02/04/23 2049 02/05/23 0500 02/05/23 0551 02/05/23 1053  BP: (!) 179/70  (!) 150/63 (!) 142/54  Pulse: 67  60 62  Resp: 18  18   Temp: 99.5 F (37.5 C)  98.4 F (36.9 C) 98.1 F (36.7 C)  TempSrc: Oral  Oral Oral  SpO2: 96%  98% 97%  Weight:  77.7 kg    Height:        Exam: Gen alert, no distress No rash, cyanosis or gangrene Sclera anicteric, throat clear  Chest - no rales today, CTA RRR no MRG Abd soft ntnd no mass or ascites +bs Ext no LE edema Neuro is alert, Ox 3 , nf       Renal-related home meds: - gabapentin 300 hs prn  - ibuprofen - irbesartan- hydrochlorothiazide 150-12.5 every day - metoprolol xl 50 hs - norvasc 10 hs      Date                          Creat               eGFR    2011- 2022               0.66- 1.00    Oct 2023                  1.30                 45 ml/min    02/01/23                   1.88                 29 ml/min     02/02/23                  2.09                 25 ml/min       CXR 10/09 - + bilat pulm edema and trace bilat effusions       UNa 63,  UCr 92       UA 10/10 --> prot 100, rare bact, 0-5 rbc/ wcb/ epis      Renal US - 9cm/ 11cm kidneys w/ no hydro          ECHO - pending results   Assessment/ Plan: AKI on CKD 3a - b/l creatinine 1.30 from 2023, eGFR 45 ml/min.  Creat here was 1.8 on admission in the setting of decompensated CHF/ pulm edema. Creat bumped to 2.0 then after getting 24 hrs of IV lasix 40 bid. UA negative, urine lytes nondescript. Suspect AKI due to decompensated CHF.  IV lasix was increased to 60 tid then to 80 tid then to 100 mg IV tid. Pt has diuresed  and lungs are clear today. Never had LE edema. F/u CXR yest was clear of edema/ congestion. Will dc IV lasix, and start po lasix 40mg  bid x 2 days then 40mg  qd. Will arrange for outpt f/u w/ [facility] in 3-4 wks.  Will sign off.  Suspected acute heart failure - on admit had pulm edema by xray w/ SOB, rales and JVD. ECHO showed EF 60-65% and G2DD. With diuresis lungs are clear on exam and by  f/u CXR.  Hyperkalemia - resolved DM2 - per pmd     Vinson Moselle MD  [facility] 02/05/2023, 12:22 PM  Recent Labs  Lab 02/01/23 0846 02/01/23 1905 02/04/23 0405 02/04/23 0631 02/05/23 0405  HGB 8.7*   < > 9.6*  --  10.6*  ALBUMIN 3.6  --   --   --   --   CALCIUM 8.3*   < >  --  8.6* 8.8*  CREATININE 1.88*   < >  --  2.00* 2.09*  K 5.5*   < >  --  3.9 4.0   < > = values in this interval not displayed.   Recent Labs  Lab 02/02/23 1654  IRON 14*  TIBC 242*  FERRITIN 154   Inpatient medications:  sodium chloride   Intravenous Once   amLODipine  10 mg Oral Daily   carvedilol  6.25 mg Oral BID WC   vitamin B-12  1,000 mcg Oral Daily   folic acid  1 mg Oral Daily   insulin aspart  0-15 Units Subcutaneous TID WC   insulin aspart  0-5 Units Subcutaneous QHS    furosemide 100 mg (02/05/23 0602)   acetaminophen **OR** acetaminophen, albuterol, hydrALAZINE, ondansetron **OR** ondansetron (ZOFRAN) IV, mouth rinse, traZODone

## 2023-02-05 NOTE — Progress Notes (Signed)
[facility]                                                                              Kimberly Hunt, is a 67 y.o. female, DOB - 27-Jan-1956, VOZ:[id number] Admit date - 02/01/2023    Outpatient Primary MD for the patient is Kimberly Pea Tawni Pummel, MD  LOS - 4  days  Chief Complaint  Patient presents with   Shortness of Breath            Brief summary   Patient is a 67 year old female with hypertension, diabetes mellitus, poorly controlled, endometrial CA status post hysterectomy presented with shortness of breath and nonproductive cough.  Patient reported that she was in her usual state of health until a day before when she started noticing some dyspnea with exertion, no lower extremity edema chest pain, palpitations or fevers.  She reported having a hard time laying flat, felt short of breath and some chest pressure.  She felt abdominal fullness. In ED, was noted to be hypoxic with O2 sats 83% on room air.  BP 152/66, pulse 63, RR 20 Sats improved to 91% on 2 L  Assessment & Plan    Acute respiratory failure with hypoxia secondary to pulmonary edema, acute CHF (congestive heart failure) (HCC) -No prior history of CHF, presented with dyspnea, apnea, elevated BNP 661, hypoxia.  Chest x-ray showed pulmonary edema and trace bilateral pleural effusions -Patient started on IV Lasix 40 mg twice daily, creatinine trended up to 2.0 -Nephrology was consulted, received Lasix 100 mg 3 times daily with significant improvement, creatinine continues to be stable, 2.0  -Will need outpatient nephrology follow-up   Active Problems:    Acute kidney injury superimposed on chronic kidney disease, 3a (HCC), hyperkalemia -Creatinine 1.3 with GFR 45 on 02/08/2022, presented with creatinine of 1.8, -Worsened to 2.0, nephrology consulted -Renal ultrasound showed no obstruction or hydronephrosis -Cr remains stable on high dose IV Lasix diuresis, will need outpatient nephrology follow-up  after discharge   Normocytic anemia, appears acute Borderline B12, iron deficiency -Unclear etiology, no reports of GI bleeding, hemoglobin 12.4 on 02/08/2022, presented with hemoglobin of 8.7-> 7.5 on 10/11, s/p 1 unit packed RBCs  -B12 203, folate 7.9, iron panel showed Fe 14, percent saturation ratio 6, ferritin 154, TIBC 242 -Has history of GERD, per patient no recent use of any NSAIDs. -GI consulted, plan for endoscopy once pulm status improved -Continue B12, folic acid replacement, risks IV iron x 1 on 10/12 -FOBT still pending, hemoglobin stable 10.6     Type 2 diabetes mellitus with hyperglycemia (HCC) Hemoglobin A1c 6.0 CBG (last 3)  Recent Labs    02/04/23 2050 02/05/23 0748 02/05/23 1158  GLUCAP 175* 160* 155*  Continue sliding scale insulin Added NovoLog 3 units 3 times daily AC    Endometrial cancer (HCC), stage I grade 1 -Status post total hysterectomy with bilateral salpingo-oophorectomy in 12/2020 Outpatient follow-up with Gyn-onc   Obesity Estimated body mass index is 33.44 kg/m as calculated from the following:   Height as of this encounter: 5' (1.524 m).   Weight as of this encounter: 77.7 kg.  Code Status: full  code  DVT Prophylaxis:  Place and maintain sequential compression device Start: 02/03/23 1402 SCDs Start: 02/01/23 1034   Level of Care: Level of care: Telemetry Family Communication: Updated patient Disposition Plan:      Remains inpatient appropriate: Workup in progress, currently on IV Lasix for diuresis.  Awaiting GI follow-up tomorrow   Procedures:    Consultants:   Renal  GI Antimicrobials:   Anti-infectives (From admission, onward)    None          Medications  sodium chloride   Intravenous Once   amLODipine  10 mg Oral Daily   carvedilol  6.25 mg Oral BID WC   vitamin B-12  1,000 mcg Oral Daily   folic acid  1 mg Oral Daily   insulin aspart  0-15 Units Subcutaneous TID WC   insulin aspart  0-5 Units Subcutaneous  QHS      Subjective:   Kimberly Hunt was seen and examined today.  No acute complaints, feeling a lot better with IV Lasix.  No acute chest pain, shortness of breath, fever chills, abdominal pain.  Objective:   Vitals:   02/04/23 2049 02/05/23 0500 02/05/23 0551 02/05/23 1053  BP: (!) 179/70  (!) 150/63 (!) 142/54  Pulse: 67  60 62  Resp: 18  18   Temp: 99.5 F (37.5 C)  98.4 F (36.9 C) 98.1 F (36.7 C)  TempSrc: Oral  Oral Oral  SpO2: 96%  98% 97%  Weight:  77.7 kg    Height:        Intake/Output Summary (Last 24 hours) at 02/05/2023 1224 Last data filed at 02/05/2023 0929 Gross per 24 hour  Intake 635.91 ml  Output 3100 ml  Net -2464.09 ml     Wt Readings from Last 3 Encounters:  02/05/23 77.7 kg  06/07/22 79.4 kg  02/04/21 87.1 kg    Physical Exam General: Alert and oriented x 3, NAD Cardiovascular: S1 S2 clear, RRR.  Respiratory: CTAB, no wheezing, rales Gastrointestinal: Soft, nontender, nondistended, NBS Ext: no pedal edema bilaterally Neuro: no new deficits Psych: Normal affect   Data Reviewed:  I have personally reviewed following labs    CBC Lab Results  Component Value Date   WBC 9.5 02/05/2023   RBC 3.56 (L) 02/05/2023   HGB 10.6 (L) 02/05/2023   HCT 31.9 (L) 02/05/2023   MCV 89.6 02/05/2023   MCH 29.8 02/05/2023   PLT 403 (H) 02/05/2023   MCHC 33.2 02/05/2023   RDW 12.7 02/05/2023   LYMPHSABS 1.4 02/01/2023   MONOABS 1.1 (H) 02/01/2023   EOSABS 0.0 02/01/2023   BASOSABS 0.1 02/01/2023     Last metabolic panel Lab Results  Component Value Date   NA 138 02/05/2023   K 4.0 02/05/2023   CL 100 02/05/2023   CO2 27 02/05/2023   BUN 68 (H) 02/05/2023   CREATININE 2.09 (H) 02/05/2023   GLUCOSE 174 (H) 02/05/2023   GFRNONAA 25 (L) 02/05/2023   GFRAA >60 12/23/2016   CALCIUM 8.8 (L) 02/05/2023   PROT 7.4 02/01/2023   ALBUMIN 3.6 02/01/2023   BILITOT 1.0 02/01/2023   ALKPHOS 68 02/01/2023   AST 28 02/01/2023   ALT 26  02/01/2023   ANIONGAP 11 02/05/2023    CBG (last 3)  Recent Labs    02/04/23 2050 02/05/23 0748 02/05/23 1158  GLUCAP 175* 160* 155*      Coagulation Profile: No results for input(s): "INR", "PROTIME" in the last 168 hours.   Radiology Studies: I  have personally reviewed the imaging studies  DG CHEST PORT 1 VIEW  Result Date: 02/04/2023 CLINICAL DATA:  Shortness of breath.  Chest tightness. EXAM: PORTABLE CHEST 1 VIEW COMPARISON:  02/01/2023 FINDINGS: The heart size and mediastinal contours are within normal limits. Both lungs are clear. The visualized skeletal structures are unremarkable. IMPRESSION: No active disease. Electronically Signed   By: Signa Kell M.D.   On: 02/04/2023 12:43       Fredrico Beedle M.D. [facility] 02/05/2023, 12:24 PM  Available via Epic secure chat 7am-7pm After 7 pm, please refer to night coverage provider listed on amion.

## 2023-02-06 ENCOUNTER — Other Ambulatory Visit: Payer: Self-pay | Admitting: Nurse Practitioner

## 2023-02-06 DIAGNOSIS — D649 Anemia, unspecified: Secondary | ICD-10-CM

## 2023-02-06 DIAGNOSIS — I5020 Unspecified systolic (congestive) heart failure: Secondary | ICD-10-CM | POA: Diagnosis not present

## 2023-02-06 DIAGNOSIS — D509 Iron deficiency anemia, unspecified: Secondary | ICD-10-CM | POA: Diagnosis not present

## 2023-02-06 DIAGNOSIS — I5021 Acute systolic (congestive) heart failure: Secondary | ICD-10-CM | POA: Diagnosis not present

## 2023-02-06 DIAGNOSIS — N189 Chronic kidney disease, unspecified: Secondary | ICD-10-CM | POA: Diagnosis not present

## 2023-02-06 DIAGNOSIS — N179 Acute kidney failure, unspecified: Secondary | ICD-10-CM | POA: Diagnosis not present

## 2023-02-06 LAB — BASIC METABOLIC PANEL
Anion gap: 10 (ref 5–15)
BUN: 75 mg/dL — ABNORMAL HIGH (ref 8–23)
CO2: 26 mmol/L (ref 22–32)
Calcium: 8.7 mg/dL — ABNORMAL LOW (ref 8.9–10.3)
Chloride: 102 mmol/L (ref 98–111)
Creatinine, Ser: 2.03 mg/dL — ABNORMAL HIGH (ref 0.44–1.00)
GFR, Estimated: 26 mL/min — ABNORMAL LOW (ref >60)
Glucose, Bld: 128 mg/dL — ABNORMAL HIGH (ref 70–99)
Potassium: 3.9 mmol/L (ref 3.5–5.1)
Sodium: 138 mmol/L (ref 135–145)

## 2023-02-06 LAB — CBC
HCT: 30.6 % — ABNORMAL LOW (ref 36.0–46.0)
Hemoglobin: 10.2 g/dL — ABNORMAL LOW (ref 12.0–15.0)
MCH: 30.3 pg (ref 26.0–34.0)
MCHC: 33.3 g/dL (ref 30.0–36.0)
MCV: 90.8 fL (ref 80.0–100.0)
Platelets: 419 10*3/uL — ABNORMAL HIGH (ref 150–400)
RBC: 3.37 MIL/uL — ABNORMAL LOW (ref 3.87–5.11)
RDW: 12.7 % (ref 11.5–15.5)
WBC: 10.1 10*3/uL (ref 4.0–10.5)
nRBC: 0 % (ref 0.0–0.2)

## 2023-02-06 LAB — PROTEIN ELECTROPHORESIS, SERUM
A/G Ratio: 0.8 (ref 0.7–1.7)
Albumin ELP: 2.7 g/dL — ABNORMAL LOW (ref 2.9–4.4)
Alpha-1-Globulin: 0.5 g/dL — ABNORMAL HIGH (ref 0.0–0.4)
Alpha-2-Globulin: 1 g/dL (ref 0.4–1.0)
Beta Globulin: 0.9 g/dL (ref 0.7–1.3)
Gamma Globulin: 0.9 g/dL (ref 0.4–1.8)
Globulin, Total: 3.3 g/dL (ref 2.2–3.9)
Total Protein ELP: 6 g/dL (ref 6.0–8.5)

## 2023-02-06 LAB — TYPE AND SCREEN
ABO/RH(D): B POS
Antibody Screen: NEGATIVE
Unit division: 0

## 2023-02-06 LAB — IMMUNOFIXATION ELECTROPHORESIS
IgA: 195 mg/dL (ref 87–352)
IgG (Immunoglobin G), Serum: 937 mg/dL (ref 586–1602)
IgM (Immunoglobulin M), Srm: 116 mg/dL (ref 26–217)
Total Protein ELP: 6 g/dL (ref 6.0–8.5)

## 2023-02-06 LAB — BPAM RBC
Blood Product Expiration Date: 202410162359
ISSUE DATE / TIME: 202410112313
Unit Type and Rh: 1700

## 2023-02-06 LAB — GLUCOSE, CAPILLARY
Glucose-Capillary: 128 mg/dL — ABNORMAL HIGH (ref 70–99)
Glucose-Capillary: 192 mg/dL — ABNORMAL HIGH (ref 70–99)

## 2023-02-06 MED ORDER — PANTOPRAZOLE SODIUM 40 MG PO TBEC: 40.0000 mg | DELAYED_RELEASE_TABLET | Freq: Every day | ORAL | 3 refills | Status: AC

## 2023-02-06 MED ORDER — FOLIC ACID 1 MG PO TABS: 1.0000 mg | ORAL_TABLET | Freq: Every day | ORAL | 3 refills | Status: AC

## 2023-02-06 MED ORDER — CARVEDILOL 6.25 MG PO TABS: 6.2500 mg | ORAL_TABLET | Freq: Two times a day (BID) | ORAL | 3 refills | Status: AC

## 2023-02-06 MED ORDER — FOLIC ACID 1 MG PO TABS: 1.0000 mg | ORAL_TABLET | Freq: Every day | ORAL | 3 refills | Status: DC

## 2023-02-06 MED ORDER — CYANOCOBALAMIN 1000 MCG PO TABS: 1000.0000 ug | ORAL_TABLET | Freq: Every day | ORAL | 3 refills | Status: AC

## 2023-02-06 MED ORDER — FUROSEMIDE 40 MG PO TABS: 40.0000 mg | ORAL_TABLET | Freq: Every day | ORAL | 1 refills | Status: AC

## 2023-02-06 NOTE — Discharge Summary (Addendum)
lb Date of Birth:  [birth year]      BSA:          1.764 m Patient Age:    66 years        BP:           164/67 mmHg Patient Gender: F               HR:           74 bpm. Exam Location:  Inpatient Procedure: 2D Echo, Cardiac Doppler and Color Doppler Indications:    CHF-Acute Systolic I50.21  History:        Patient has no prior history of Echocardiogram examinations.                 CHF; Risk Factors:Diabetes and Hypertension.  Sonographer:    Lucendia Herrlich Referring Phys: [id number] MIR M [facility] IMPRESSIONS  1. Left ventricular ejection fraction, by estimation, is 60 to 65%. The left ventricle has normal function. The left ventricle has no regional wall motion abnormalities. Left ventricular diastolic parameters are consistent with Grade II diastolic dysfunction (pseudonormalization). Elevated left ventricular end-diastolic pressure.  2. Right ventricular systolic function is normal. The right ventricular size is normal. There is normal pulmonary artery systolic pressure.  3. Left atrial size was mildly dilated.  4. The mitral valve is normal in structure. Trivial mitral valve regurgitation. No evidence of mitral stenosis.  5. The aortic valve is tricuspid. Aortic valve regurgitation is not visualized. No aortic stenosis is present.  6. The inferior vena cava is normal in size with greater than 50% respiratory variability, suggesting right atrial pressure of 3 mmHg. FINDINGS  Left Ventricle: Left ventricular ejection  fraction, by estimation, is 60 to 65%. The left ventricle has normal function. The left ventricle has no regional wall motion abnormalities. The left ventricular internal cavity size was normal in size. There is  no left ventricular hypertrophy. Left ventricular diastolic parameters are consistent with Grade II diastolic dysfunction (pseudonormalization). Elevated left ventricular end-diastolic pressure. Right Ventricle: The right ventricular size is normal. No increase in right ventricular wall thickness. Right ventricular systolic function is normal. There is normal pulmonary artery systolic pressure. The tricuspid regurgitant velocity is 2.84 m/s, and  with an assumed right atrial pressure of 3 mmHg, the estimated right ventricular systolic pressure is 35.3 mmHg. Left Atrium: Left atrial size was mildly dilated. Right Atrium: Right atrial size was normal in size. Pericardium: There is no evidence of pericardial effusion. Mitral Valve: The mitral valve is normal in structure. Trivial mitral valve regurgitation. No evidence of mitral valve stenosis. Tricuspid Valve: The tricuspid valve is normal in structure. Tricuspid valve regurgitation is trivial. No evidence of tricuspid stenosis. Aortic Valve: The aortic valve is tricuspid. Aortic valve regurgitation is not visualized. No aortic stenosis is present. Aortic valve peak gradient measures 12.2 mmHg. Pulmonic Valve: The pulmonic valve was normal in structure. Pulmonic valve regurgitation is not visualized. No evidence of pulmonic stenosis. Aorta: The aortic root is normal in size and structure. Venous: The inferior vena cava is normal in size with greater than 50% respiratory variability, suggesting right atrial pressure of 3 mmHg. IAS/Shunts: No atrial level shunt detected by color flow Doppler.  LEFT VENTRICLE PLAX 2D LVIDd:         4.40 cm   Diastology LVIDs:         2.90 cm   LV e' medial:    8.27 cm/s LV PW:  lb Date of Birth:  [birth year]      BSA:          1.764 m Patient Age:    66 years        BP:           164/67 mmHg Patient Gender: F               HR:           74 bpm. Exam Location:  Inpatient Procedure: 2D Echo, Cardiac Doppler and Color Doppler Indications:    CHF-Acute Systolic I50.21  History:        Patient has no prior history of Echocardiogram examinations.                 CHF; Risk Factors:Diabetes and Hypertension.  Sonographer:    Lucendia Herrlich Referring Phys: [id number] MIR M [facility] IMPRESSIONS  1. Left ventricular ejection fraction, by estimation, is 60 to 65%. The left ventricle has normal function. The left ventricle has no regional wall motion abnormalities. Left ventricular diastolic parameters are consistent with Grade II diastolic dysfunction (pseudonormalization). Elevated left ventricular end-diastolic pressure.  2. Right ventricular systolic function is normal. The right ventricular size is normal. There is normal pulmonary artery systolic pressure.  3. Left atrial size was mildly dilated.  4. The mitral valve is normal in structure. Trivial mitral valve regurgitation. No evidence of mitral stenosis.  5. The aortic valve is tricuspid. Aortic valve regurgitation is not visualized. No aortic stenosis is present.  6. The inferior vena cava is normal in size with greater than 50% respiratory variability, suggesting right atrial pressure of 3 mmHg. FINDINGS  Left Ventricle: Left ventricular ejection  fraction, by estimation, is 60 to 65%. The left ventricle has normal function. The left ventricle has no regional wall motion abnormalities. The left ventricular internal cavity size was normal in size. There is  no left ventricular hypertrophy. Left ventricular diastolic parameters are consistent with Grade II diastolic dysfunction (pseudonormalization). Elevated left ventricular end-diastolic pressure. Right Ventricle: The right ventricular size is normal. No increase in right ventricular wall thickness. Right ventricular systolic function is normal. There is normal pulmonary artery systolic pressure. The tricuspid regurgitant velocity is 2.84 m/s, and  with an assumed right atrial pressure of 3 mmHg, the estimated right ventricular systolic pressure is 35.3 mmHg. Left Atrium: Left atrial size was mildly dilated. Right Atrium: Right atrial size was normal in size. Pericardium: There is no evidence of pericardial effusion. Mitral Valve: The mitral valve is normal in structure. Trivial mitral valve regurgitation. No evidence of mitral valve stenosis. Tricuspid Valve: The tricuspid valve is normal in structure. Tricuspid valve regurgitation is trivial. No evidence of tricuspid stenosis. Aortic Valve: The aortic valve is tricuspid. Aortic valve regurgitation is not visualized. No aortic stenosis is present. Aortic valve peak gradient measures 12.2 mmHg. Pulmonic Valve: The pulmonic valve was normal in structure. Pulmonic valve regurgitation is not visualized. No evidence of pulmonic stenosis. Aorta: The aortic root is normal in size and structure. Venous: The inferior vena cava is normal in size with greater than 50% respiratory variability, suggesting right atrial pressure of 3 mmHg. IAS/Shunts: No atrial level shunt detected by color flow Doppler.  LEFT VENTRICLE PLAX 2D LVIDd:         4.40 cm   Diastology LVIDs:         2.90 cm   LV e' medial:    8.27 cm/s LV PW:  Physician Discharge Summary   Patient: Kimberly Hunt MRN: [id number] DOB: October 09, 1955  Admit date:     02/01/2023  Discharge date: 02/06/23  Discharge Physician: Thad Ranger, MD    PCP: Noberto Retort, MD   Recommendations at discharge:   Continue Lasix 40 mg twice daily for 2 more days and then decrease to 40 mg daily Coreg 6.25 mg twice daily Outpatient referral to [facility] sent for follow-up Follow-up with gastroenterology for further workup of anemia  Discharge Diagnoses:   Acute respiratory failure with hypoxia Acute pulmonary edema/acute diastolic CHF (congestive heart failure) (HCC)   Type 2 diabetes mellitus with hyperglycemia (HCC)   Endometrial cancer (HCC)   Acute kidney injury superimposed on chronic kidney disease 3A (HCC) Acute normocytic anemia Iron deficiency, B12 deficiency/borderline low Hyperkalemia Obesity History of stage I grade 1 endometrial CA  Hospital Course:  Patient is a 67 year old female with hypertension, diabetes mellitus, poorly controlled, endometrial CA status post hysterectomy presented with shortness of breath and nonproductive cough.  Patient reported that she was in her usual state of health until a day before when she started noticing some dyspnea with exertion, no lower extremity edema chest pain, palpitations or fevers.  She reported having a hard time laying flat, felt short of breath and some chest pressure.  She felt abdominal fullness. In ED, was noted to be hypoxic with O2 sats 83% on room air.  BP 152/66, pulse 63, RR 20 Sats improved to 91% on 2 L   Assessment and Plan:    Acute respiratory failure with hypoxia secondary to pulmonary edema, acute diastolic CHF (congestive heart failure) (HCC) -No prior history of CHF, presented with dyspnea, apnea, elevated BNP 661, hypoxia.  Chest x-ray showed pulmonary edema and trace bilateral pleural effusions -Patient started on IV Lasix 40 mg twice daily,  creatinine trended up to 2.0. Nephrology was consulted, received Lasix 100 mg 3 times daily with significant improvement, creatinine continues to be stable, 2.0  -Will need outpatient nephrology follow-up, referral sent  -She has been transitioned to oral Lasix 40 mg twice daily for 2 more days and then continue Lasix 40 mg daily -Creatinine 2.0 at discharge. -2D echo showed EF of 60 to 65%, no regional wall motion abnormalities, G2 DD   wa   Acute kidney injury superimposed on chronic kidney disease, 3a (HCC), hyperkalemia -Creatinine 1.3 with GFR 45 on 02/08/2022, presented with creatinine of 1.8, -Worsened to 2.0, nephrology was consulted -Renal ultrasound showed no obstruction or hydronephrosis -Creatinine remained stable, tolerating Lasix very well, she will need outpatient follow-up.  Ambulatory referral to nephrology sent.     Normocytc anemia, appears acute Borderline B12, iron deficiency -Unclear etiology, no reports of GI bleeding, hemoglobin 12.4 on 02/08/2022,  - presented with hemoglobin of 8.7-> 7.5 on 10/11, s/p 1 unit packed RBCs  -B12 203, folate 7.9, iron panel showed Fe 14, percent saturation ratio 6, ferritin 154, TIBC 242 -Has history of GERD, per patient no recent use of any NSAIDs. -Continue B12, folic acid replacement, IV iron x 1 on 10/12 -FOBT negative, hemoglobin stable 10.2 at discharge  -Seen by GI, plan for outpatient workup and endoscopy, no NSAIDs.  Outpatient GI appointment scheduled     Type 2 diabetes mellitus with hyperglycemia (HCC) Hemoglobin A1c 6.0 -Continue outpatient regimen     Endometrial cancer (HCC), stage I grade 1 -Status post total hysterectomy with bilateral salpingo-oophorectomy in 12/2020 Outpatient follow-up with Gyn-onc     Obesity Estimated  Physician Discharge Summary   Patient: Kimberly Hunt MRN: [id number] DOB: October 09, 1955  Admit date:     02/01/2023  Discharge date: 02/06/23  Discharge Physician: Thad Ranger, MD    PCP: Noberto Retort, MD   Recommendations at discharge:   Continue Lasix 40 mg twice daily for 2 more days and then decrease to 40 mg daily Coreg 6.25 mg twice daily Outpatient referral to [facility] sent for follow-up Follow-up with gastroenterology for further workup of anemia  Discharge Diagnoses:   Acute respiratory failure with hypoxia Acute pulmonary edema/acute diastolic CHF (congestive heart failure) (HCC)   Type 2 diabetes mellitus with hyperglycemia (HCC)   Endometrial cancer (HCC)   Acute kidney injury superimposed on chronic kidney disease 3A (HCC) Acute normocytic anemia Iron deficiency, B12 deficiency/borderline low Hyperkalemia Obesity History of stage I grade 1 endometrial CA  Hospital Course:  Patient is a 67 year old female with hypertension, diabetes mellitus, poorly controlled, endometrial CA status post hysterectomy presented with shortness of breath and nonproductive cough.  Patient reported that she was in her usual state of health until a day before when she started noticing some dyspnea with exertion, no lower extremity edema chest pain, palpitations or fevers.  She reported having a hard time laying flat, felt short of breath and some chest pressure.  She felt abdominal fullness. In ED, was noted to be hypoxic with O2 sats 83% on room air.  BP 152/66, pulse 63, RR 20 Sats improved to 91% on 2 L   Assessment and Plan:    Acute respiratory failure with hypoxia secondary to pulmonary edema, acute diastolic CHF (congestive heart failure) (HCC) -No prior history of CHF, presented with dyspnea, apnea, elevated BNP 661, hypoxia.  Chest x-ray showed pulmonary edema and trace bilateral pleural effusions -Patient started on IV Lasix 40 mg twice daily,  creatinine trended up to 2.0. Nephrology was consulted, received Lasix 100 mg 3 times daily with significant improvement, creatinine continues to be stable, 2.0  -Will need outpatient nephrology follow-up, referral sent  -She has been transitioned to oral Lasix 40 mg twice daily for 2 more days and then continue Lasix 40 mg daily -Creatinine 2.0 at discharge. -2D echo showed EF of 60 to 65%, no regional wall motion abnormalities, G2 DD   wa   Acute kidney injury superimposed on chronic kidney disease, 3a (HCC), hyperkalemia -Creatinine 1.3 with GFR 45 on 02/08/2022, presented with creatinine of 1.8, -Worsened to 2.0, nephrology was consulted -Renal ultrasound showed no obstruction or hydronephrosis -Creatinine remained stable, tolerating Lasix very well, she will need outpatient follow-up.  Ambulatory referral to nephrology sent.     Normocytc anemia, appears acute Borderline B12, iron deficiency -Unclear etiology, no reports of GI bleeding, hemoglobin 12.4 on 02/08/2022,  - presented with hemoglobin of 8.7-> 7.5 on 10/11, s/p 1 unit packed RBCs  -B12 203, folate 7.9, iron panel showed Fe 14, percent saturation ratio 6, ferritin 154, TIBC 242 -Has history of GERD, per patient no recent use of any NSAIDs. -Continue B12, folic acid replacement, IV iron x 1 on 10/12 -FOBT negative, hemoglobin stable 10.2 at discharge  -Seen by GI, plan for outpatient workup and endoscopy, no NSAIDs.  Outpatient GI appointment scheduled     Type 2 diabetes mellitus with hyperglycemia (HCC) Hemoglobin A1c 6.0 -Continue outpatient regimen     Endometrial cancer (HCC), stage I grade 1 -Status post total hysterectomy with bilateral salpingo-oophorectomy in 12/2020 Outpatient follow-up with Gyn-onc     Obesity Estimated  lb Date of Birth:  [birth year]      BSA:          1.764 m Patient Age:    66 years        BP:           164/67 mmHg Patient Gender: F               HR:           74 bpm. Exam Location:  Inpatient Procedure: 2D Echo, Cardiac Doppler and Color Doppler Indications:    CHF-Acute Systolic I50.21  History:        Patient has no prior history of Echocardiogram examinations.                 CHF; Risk Factors:Diabetes and Hypertension.  Sonographer:    Lucendia Herrlich Referring Phys: [id number] MIR M [facility] IMPRESSIONS  1. Left ventricular ejection fraction, by estimation, is 60 to 65%. The left ventricle has normal function. The left ventricle has no regional wall motion abnormalities. Left ventricular diastolic parameters are consistent with Grade II diastolic dysfunction (pseudonormalization). Elevated left ventricular end-diastolic pressure.  2. Right ventricular systolic function is normal. The right ventricular size is normal. There is normal pulmonary artery systolic pressure.  3. Left atrial size was mildly dilated.  4. The mitral valve is normal in structure. Trivial mitral valve regurgitation. No evidence of mitral stenosis.  5. The aortic valve is tricuspid. Aortic valve regurgitation is not visualized. No aortic stenosis is present.  6. The inferior vena cava is normal in size with greater than 50% respiratory variability, suggesting right atrial pressure of 3 mmHg. FINDINGS  Left Ventricle: Left ventricular ejection  fraction, by estimation, is 60 to 65%. The left ventricle has normal function. The left ventricle has no regional wall motion abnormalities. The left ventricular internal cavity size was normal in size. There is  no left ventricular hypertrophy. Left ventricular diastolic parameters are consistent with Grade II diastolic dysfunction (pseudonormalization). Elevated left ventricular end-diastolic pressure. Right Ventricle: The right ventricular size is normal. No increase in right ventricular wall thickness. Right ventricular systolic function is normal. There is normal pulmonary artery systolic pressure. The tricuspid regurgitant velocity is 2.84 m/s, and  with an assumed right atrial pressure of 3 mmHg, the estimated right ventricular systolic pressure is 35.3 mmHg. Left Atrium: Left atrial size was mildly dilated. Right Atrium: Right atrial size was normal in size. Pericardium: There is no evidence of pericardial effusion. Mitral Valve: The mitral valve is normal in structure. Trivial mitral valve regurgitation. No evidence of mitral valve stenosis. Tricuspid Valve: The tricuspid valve is normal in structure. Tricuspid valve regurgitation is trivial. No evidence of tricuspid stenosis. Aortic Valve: The aortic valve is tricuspid. Aortic valve regurgitation is not visualized. No aortic stenosis is present. Aortic valve peak gradient measures 12.2 mmHg. Pulmonic Valve: The pulmonic valve was normal in structure. Pulmonic valve regurgitation is not visualized. No evidence of pulmonic stenosis. Aorta: The aortic root is normal in size and structure. Venous: The inferior vena cava is normal in size with greater than 50% respiratory variability, suggesting right atrial pressure of 3 mmHg. IAS/Shunts: No atrial level shunt detected by color flow Doppler.  LEFT VENTRICLE PLAX 2D LVIDd:         4.40 cm   Diastology LVIDs:         2.90 cm   LV e' medial:    8.27 cm/s LV PW:  Physician Discharge Summary   Patient: Kimberly Hunt MRN: [id number] DOB: October 09, 1955  Admit date:     02/01/2023  Discharge date: 02/06/23  Discharge Physician: Thad Ranger, MD    PCP: Noberto Retort, MD   Recommendations at discharge:   Continue Lasix 40 mg twice daily for 2 more days and then decrease to 40 mg daily Coreg 6.25 mg twice daily Outpatient referral to [facility] sent for follow-up Follow-up with gastroenterology for further workup of anemia  Discharge Diagnoses:   Acute respiratory failure with hypoxia Acute pulmonary edema/acute diastolic CHF (congestive heart failure) (HCC)   Type 2 diabetes mellitus with hyperglycemia (HCC)   Endometrial cancer (HCC)   Acute kidney injury superimposed on chronic kidney disease 3A (HCC) Acute normocytic anemia Iron deficiency, B12 deficiency/borderline low Hyperkalemia Obesity History of stage I grade 1 endometrial CA  Hospital Course:  Patient is a 67 year old female with hypertension, diabetes mellitus, poorly controlled, endometrial CA status post hysterectomy presented with shortness of breath and nonproductive cough.  Patient reported that she was in her usual state of health until a day before when she started noticing some dyspnea with exertion, no lower extremity edema chest pain, palpitations or fevers.  She reported having a hard time laying flat, felt short of breath and some chest pressure.  She felt abdominal fullness. In ED, was noted to be hypoxic with O2 sats 83% on room air.  BP 152/66, pulse 63, RR 20 Sats improved to 91% on 2 L   Assessment and Plan:    Acute respiratory failure with hypoxia secondary to pulmonary edema, acute diastolic CHF (congestive heart failure) (HCC) -No prior history of CHF, presented with dyspnea, apnea, elevated BNP 661, hypoxia.  Chest x-ray showed pulmonary edema and trace bilateral pleural effusions -Patient started on IV Lasix 40 mg twice daily,  creatinine trended up to 2.0. Nephrology was consulted, received Lasix 100 mg 3 times daily with significant improvement, creatinine continues to be stable, 2.0  -Will need outpatient nephrology follow-up, referral sent  -She has been transitioned to oral Lasix 40 mg twice daily for 2 more days and then continue Lasix 40 mg daily -Creatinine 2.0 at discharge. -2D echo showed EF of 60 to 65%, no regional wall motion abnormalities, G2 DD   wa   Acute kidney injury superimposed on chronic kidney disease, 3a (HCC), hyperkalemia -Creatinine 1.3 with GFR 45 on 02/08/2022, presented with creatinine of 1.8, -Worsened to 2.0, nephrology was consulted -Renal ultrasound showed no obstruction or hydronephrosis -Creatinine remained stable, tolerating Lasix very well, she will need outpatient follow-up.  Ambulatory referral to nephrology sent.     Normocytc anemia, appears acute Borderline B12, iron deficiency -Unclear etiology, no reports of GI bleeding, hemoglobin 12.4 on 02/08/2022,  - presented with hemoglobin of 8.7-> 7.5 on 10/11, s/p 1 unit packed RBCs  -B12 203, folate 7.9, iron panel showed Fe 14, percent saturation ratio 6, ferritin 154, TIBC 242 -Has history of GERD, per patient no recent use of any NSAIDs. -Continue B12, folic acid replacement, IV iron x 1 on 10/12 -FOBT negative, hemoglobin stable 10.2 at discharge  -Seen by GI, plan for outpatient workup and endoscopy, no NSAIDs.  Outpatient GI appointment scheduled     Type 2 diabetes mellitus with hyperglycemia (HCC) Hemoglobin A1c 6.0 -Continue outpatient regimen     Endometrial cancer (HCC), stage I grade 1 -Status post total hysterectomy with bilateral salpingo-oophorectomy in 12/2020 Outpatient follow-up with Gyn-onc     Obesity Estimated

## 2023-02-06 NOTE — Progress Notes (Signed)
Daily Progress Note  DOA: 02/01/2023 Hospital Day: 6  Chief Complaint: Anemia  ASSESSMENT    Brief Narrative:  Kimberly Hunt is a 67 y.o. year old female knonw remotely to Dr. Marina Goodell with a history of  hypertension, diabetes mellitus, poorly controlled, endometrial CA status post hysterectomy, cholecystectomy, GERD and small adenomatous colon polyp . Admitted with acute respiratory failure / acute CHF, AKI on CKD stage IIIb  [place] anemia ( ? Iron deficiency) / FOBT negative Presenting hgb 8.7 ( baseline 12.4 one year prior).  Hgb improved and stable at 10.2 post 1 u PRBCs on 10/11 and dose of IV dose of IV iron  Hx of colon polyps.  Diminutive tubular adenoma in 2011  Acute heart failure. Grade II diastolic dysfunction on echocardiogram  Chronic GERD. Asymptomatic on daily PPI   Principal Problem:   Acute CHF (congestive heart failure) (HCC) Active Problems:   Type 2 diabetes mellitus with hyperglycemia (HCC)   Endometrial cancer (HCC)   Acute kidney injury superimposed on chronic kidney disease (HCC)   Normocytic anemia   PLAN   --She is medically stable for discharge. Reasonable to defer endoscopic evaluation of anemia to outpatient setting.  --Recommend she not take NSAIDs for now  --Continue daily pantoprazole 30 minutes prior to breakfast. --Follow up with Dr. Marina Goodell 1/2 at 9 am   --Please come to basement of our office for CBC in 2 weeks to make sure hgb is stable.  Subjective   Feels fine. Wants to go home. No abdominal pain. No overt GI bleeding. No SHOB   Objective     Recent Labs    02/04/23 0405 02/05/23 0405 02/06/23 0345  WBC 9.0 9.5 10.1  HGB 9.6* 10.6* 10.2*  HCT 28.5* 31.9* 30.6*  PLT 383 403* 419*   BMET Recent Labs    02/04/23 0631 02/05/23 0405 02/06/23 0345  NA 138 138 138  K 3.9 4.0 3.9  CL 99 100 102  CO2 25 27 26   GLUCOSE 128* 174* 128*  BUN 66* 68* 75*  CREATININE 2.00* 2.09* 2.03*  CALCIUM 8.6* 8.8* 8.7*   LFT No  results for input(s): "PROT", "ALBUMIN", "AST", "ALT", "ALKPHOS", "BILITOT", "BILIDIR", "IBILI" in the last 72 hours. PT/INR No results for input(s): "LABPROT", "INR" in the last 72 hours.   Imaging:  DG CHEST PORT 1 VIEW CLINICAL DATA:  Shortness of breath.  Chest tightness.  EXAM: PORTABLE CHEST 1 VIEW  COMPARISON:  02/01/2023  FINDINGS: The heart size and mediastinal contours are within normal limits. Both lungs are clear. The visualized skeletal structures are unremarkable.  IMPRESSION: No active disease.  Electronically Signed   By: Signa Kell M.D.   On: 02/04/2023 12:43     Scheduled inpatient medications:   sodium chloride   Intravenous Once   amLODipine  10 mg Oral Daily   carvedilol  6.25 mg Oral BID WC   vitamin B-12  1,000 mcg Oral Daily   folic acid  1 mg Oral Daily   furosemide  40 mg Oral BID   [START ON 02/08/2023] furosemide  40 mg Oral Daily   insulin aspart  0-15 Units Subcutaneous TID WC   insulin aspart  0-5 Units Subcutaneous QHS   insulin aspart  3 Units Subcutaneous TID WC   Continuous inpatient infusions:  PRN inpatient medications: acetaminophen **OR** acetaminophen, albuterol, hydrALAZINE, ondansetron **OR** ondansetron (ZOFRAN) IV, mouth rinse, traZODone  Vital signs in last 24 hours: Temp:  [98 F (36.7 C)-98.7  F (37.1 C)] 98 F (36.7 C) (10/14 0620) Pulse Rate:  [61-74] 61 (10/14 0620) Resp:  [18] 18 (10/14 0620) BP: (142-169)/(54-83) 149/63 (10/14 0620) SpO2:  [96 %-97 %] 96 % (10/14 0620) Weight:  [79.1 kg] 79.1 kg (10/14 0500) Last BM Date : 02/05/23  Intake/Output Summary (Last 24 hours) at 02/06/2023 0843 Last data filed at 02/06/2023 0000 Gross per 24 hour  Intake 600 ml  Output 650 ml  Net -50 ml    Intake/Output from previous day: 10/13 0701 - 10/14 0700 In: 600 [P.O.:600] Out: 650 [Urine:650] Intake/Output this shift: No intake/output data recorded.   Physical Exam:  General: Alert female in NAD Heart:   Regular rate and rhythm.  Pulmonary: Normal respiratory effort Abdomen: Soft, nondistended, nontender. Normal bowel sounds. Extremities: No lower extremity edema  Neurologic: Alert and oriented Psych: Pleasant. Cooperative. Insight appears normal.      LOS: 5 days   Willette Cluster ,NP 02/06/2023, 8:43 AM

## 2023-02-06 NOTE — Progress Notes (Signed)
PIV removed as charted. AVS reviewed w/ Laurabelle who verbalized an understanding. Pt's sister enroute to the hospital. Pt dressing  for discharge to home

## 2023-02-07 ENCOUNTER — Telehealth: Payer: Self-pay | Admitting: *Deleted

## 2023-02-07 NOTE — Telephone Encounter (Signed)
Called patient due to an cancellation for tomorrow with Ms.  Bayley at 8:30a, patient was able to take the cancellation. This visit is a hospital f/u visit due to anemia.

## 2023-02-07 NOTE — Telephone Encounter (Signed)
-----   Message from Willette Cluster sent at 02/06/2023  9:27 AM EDT ----- Kimberly Hunt,  This patient needs a hospital follow with anemia. She is going to need an EGD / colon  but needs to be seen in office first. I made her an appt for January just to have something on record as she is being discharged from the hospital. Please use one of the 7 day hold spots and get her in with Bayley within the next 4 weeks. Thanks

## 2023-02-08 ENCOUNTER — Ambulatory Visit: Payer: Medicare PPO | Admitting: Gastroenterology

## 2023-02-08 ENCOUNTER — Encounter: Payer: Self-pay | Admitting: Gastroenterology

## 2023-02-08 VITALS — BP 130/60 | HR 85 | Ht 61.0 in | Wt 170.8 lb

## 2023-02-08 DIAGNOSIS — Z860100 Personal history of colon polyps, unspecified: Secondary | ICD-10-CM

## 2023-02-08 DIAGNOSIS — K5900 Constipation, unspecified: Secondary | ICD-10-CM

## 2023-02-08 DIAGNOSIS — D509 Iron deficiency anemia, unspecified: Secondary | ICD-10-CM

## 2023-02-08 DIAGNOSIS — I509 Heart failure, unspecified: Secondary | ICD-10-CM | POA: Diagnosis not present

## 2023-02-08 DIAGNOSIS — E538 Deficiency of other specified B group vitamins: Secondary | ICD-10-CM | POA: Diagnosis not present

## 2023-02-08 MED ORDER — IRON 325 (65 FE) MG PO TABS
325.0000 mg | ORAL_TABLET | Freq: Once | ORAL | 0 refills | Status: DC
Start: 2023-02-08 — End: 2024-03-01

## 2023-02-08 MED ORDER — NA SULFATE-K SULFATE-MG SULF 17.5-3.13-1.6 GM/177ML PO SOLN: 1.0000 | ORAL | 0 refills | Status: DC

## 2023-02-08 NOTE — Progress Notes (Signed)
Chief Complaint: Iron deficiency anemia Primary GI MD: Dr. Marina Goodell  HPI: 67 year old female history of endometrial cancer s/p hysterectomy, poorly controlled diabetes, hypertension, CHF, presenting for hospital follow-up.  Recently admitted 02/03/2023 with decompensated CHF/pulmonary edema and AKI on CKD.  Patient was put on diuretics for volume overload.  She was found to be anemic with Hgb 7.5 (12.26 January 2022).  No overt bleeding.  Negative fecal occult. patient was put on 2 LPM via [place].  She is not typically on oxygen.  Other workup is notable for - Iron 14, saturation 6%, ferritin 154, TIBC 242 - B12 203, folate 7.9  Patient was discharged with outpatient follow-up to undergo EGD/colonoscopy when respiratory status improved.  Most recent CBC 02/06/2023 with Hgb 10.2 s/p 1 unit PRBCs  Patient states since being discharged she is doing great.  No longer having swelling in her legs and no longer on oxygen.  Having no difficulty breathing.  Still has some fatigue.  Denies overt bleeding.  Notes chronic constipation in which she has a bowel movement every 3 to 4 days with significant straining.   PREVIOUS GI WORKUP   Colonoscopy 2011 1) Diminutive polyp (tubular adenoma) in the ascending colon - removed 2) Otherwise normal examination   EGD 2006 for dyspepsia and abdominal pain - Normal proximal esophagus to second portion of duodenum  Past Medical History:  Diagnosis Date   Anxiety    Arthritis    Depression    Diabetes mellitus without complication (HCC)    Family history of breast cancer 02/08/2021   Family history of prostate cancer 02/08/2021   Heart murmur    no problems mild told in her 30's   Hypertension    Neuromuscular disorder (HCC)    neuropathy feet   Pancreatitis     Past Surgical History:  Procedure Laterality Date   CERVICAL POLYPECTOMY     CHOLECYSTECTOMY     COLONOSCOPY W/ POLYPECTOMY     ROBOTIC ASSISTED TOTAL HYSTERECTOMY WITH BILATERAL  SALPINGO OOPHERECTOMY N/A 01/12/2021   Procedure: XI ROBOTIC ASSISTED TOTAL HYSTERECTOMY WITH BILATERAL SALPINGO OOPHORECTOMY;  Surgeon: Adolphus Birchwood, MD;  Location: [facility];  Service: Gynecology;  Laterality: N/A;   SENTINEL NODE BIOPSY N/A 01/12/2021   Procedure: SENTINEL LYMPH NODE BIOPSY;  Surgeon: Adolphus Birchwood, MD;  Location: [facility];  Service: Gynecology;  Laterality: N/A;   TONSILLECTOMY     UTERINE FIBROID SURGERY      Current Outpatient Medications  Medication Sig Dispense Refill   acetaminophen (TYLENOL) 500 MG tablet Take 1 tablet (500 mg total) by mouth every 6 (six) hours as needed. (Patient not taking: Reported on 02/01/2023) 30 tablet 0   amLODipine (NORVASC) 10 MG tablet Take 10 mg by mouth at bedtime.     blood glucose meter kit and supplies KIT Dispense based on patient and insurance preference. Use up to four times daily as directed. (FOR ICD-9 250.00, 250.01). 1 each 0   carvedilol (COREG) 6.25 MG tablet Take 1 tablet (6.25 mg total) by mouth 2 (two) times daily with a meal. 60 tablet 3   citalopram (CELEXA) 40 MG tablet Take 40 mg by mouth at bedtime.     cyanocobalamin 1000 MCG tablet Take 1 tablet (1,000 mcg total) by mouth daily. 30 tablet 3   folic acid (FOLVITE) 1 MG tablet Take 1 tablet (1 mg total) by mouth daily. 30 tablet 3   furosemide (LASIX) 40 MG tablet Take 1 tablet (40 mg total) by mouth daily. Take Lasix  40mg  (1 tab) twice a day for 2 days, then continue 40 mg once daily 90 tablet 1   gabapentin (NEURONTIN) 300 MG capsule Take 300 mg by mouth at bedtime as needed (for pain).     HYDROcodone-acetaminophen (NORCO) 7.5-325 MG tablet Take 1-2 tablets by mouth See admin instructions. Take 2 tablets by mouth at bedtime and an additional 1 tablet once a day as needed for pain     insulin aspart (NOVOLOG FLEXPEN) 100 UNIT/ML FlexPen Inject 0-20 Units into the skin 3 (three) times daily with meals.     LORazepam (ATIVAN) 0.5 MG tablet Take 0.5 mg by mouth 2 (two) times daily  as needed for anxiety.     metFORMIN (GLUCOPHAGE-XR) 500 MG 24 hr tablet Take 500 mg by mouth every evening.     pantoprazole (PROTONIX) 40 MG tablet Take 1 tablet (40 mg total) by mouth at bedtime. 30 tablet 3   rosuvastatin (CRESTOR) 10 MG tablet Take 10 mg by mouth every Friday.     TOUJEO MAX SOLOSTAR 300 UNIT/ML Solostar Pen Inject 45 Units into the skin every evening.     zolpidem (AMBIEN) 10 MG tablet Take 10 mg by mouth at bedtime as needed for sleep.  1   No current facility-administered medications for this visit.    Allergies as of 02/08/2023 - Review Complete 02/01/2023  Allergen Reaction Noted   Shellfish allergy Hives and Cough 12/10/2013    Family History  Problem Relation Age of Onset   Breast cancer Mother        dx after 27   Cancer Maternal Aunt        unknown type; dx after 50   Cancer Maternal Uncle        unknown type; dx after 33, x2 mat uncles   Cancer Paternal Aunt        unknown type; dx after 50   Prostate cancer Paternal Uncle        dx after 40; mets    Social History   Socioeconomic History   Marital status: Single    Spouse name: Not on file   Number of children: Not on file   Years of education: Not on file   Highest education level: Not on file  Occupational History   Not on file  Tobacco Use   Smoking status: Former    Current packs/day: 0.00    Types: Cigarettes    Start date: 39    Quit date: 75    Years since quitting: 44.8   Smokeless tobacco: Never  Vaping Use   Vaping status: Never Used  Substance and Sexual Activity   Alcohol use: No   Drug use: No   Sexual activity: Not Currently    Birth control/protection: Post-menopausal  Other Topics Concern   Not on file  Social History Narrative   Not on file   Social Determinants of Health   Financial Resource Strain: Low Risk  (02/04/2021)   Overall Financial Resource Strain (CARDIA)    Difficulty of Paying Living Expenses: Not hard at all  Food Insecurity: No Food  Insecurity (02/01/2023)   Hunger Vital Sign    Worried About Running Out of Food in the Last Year: Never true    Ran Out of Food in the Last Year: Never true  Transportation Needs: No Transportation Needs (02/01/2023)   PRAPARE - Administrator, Civil Service (Medical): No    Lack of Transportation (Non-Medical): No  Physical Activity: Inactive (  02/04/2021)   Exercise Vital Sign    Days of Exercise per Week: 0 days    Minutes of Exercise per Session: 0 min  Stress: No Stress Concern Present (02/04/2021)   [employer] of Occupational Health - Occupational Stress Questionnaire    Feeling of Stress : Only a little  Social Connections: Moderately Isolated (02/04/2021)   Social Connection and Isolation Panel [NHANES]    Frequency of Communication with Friends and Family: Three times a week    Frequency of Social Gatherings with Friends and Family: Once a week    Attends Religious Services: 1 to 4 times per year    Active Member of [employer] or Organizations: No    Attends Banker Meetings: Never    Marital Status: Never married  Intimate Partner Violence: Not At Risk (02/01/2023)   Humiliation, Afraid, Rape, and Kick questionnaire    Fear of Current or Ex-Partner: No    Emotionally Abused: No    Physically Abused: No    Sexually Abused: No    Review of Systems:    Constitutional: No weight loss, fever, chills, weakness or fatigue HEENT: Eyes: No change in vision               Ears, Nose, Throat:  No change in hearing or congestion Skin: No rash or itching Cardiovascular: No chest pain, chest pressure or palpitations   Respiratory: No SOB or cough Gastrointestinal: See HPI and otherwise negative Genitourinary: No dysuria or change in urinary frequency Neurological: No headache, dizziness or syncope Musculoskeletal: No new muscle or joint pain Hematologic: No bleeding or bruising Psychiatric: No history of depression or anxiety    Physical Exam:  Vital  signs: There were no vitals taken for this visit.  Constitutional: NAD, Well developed, Well nourished, alert and cooperative.  appears younger than stated age Head:  Normocephalic and atraumatic. Eyes:   PEERL, EOMI. No icterus. Conjunctiva pink. Respiratory: Respirations even and unlabored. Lungs clear to auscultation bilaterally.   No wheezes, crackles, or rhonchi.  Cardiovascular:  Regular rate and rhythm. No peripheral edema, cyanosis or pallor.  Gastrointestinal:  Soft, nondistended, nontender. No rebound or guarding. Normal bowel sounds. No appreciable masses or hepatomegaly. Rectal:  Not performed.  Msk:  Symmetrical without gross deformities. Without edema, no deformity or joint abnormality.  Neurologic:  Alert and  oriented x4;  grossly normal neurologically.  Skin:   Dry and intact without significant lesions or rashes. Psychiatric: Oriented to person, place and time. Demonstrates good judgement and reason without abnormal affect or behaviors.   RELEVANT LABS AND IMAGING: CBC    Component Value Date/Time   WBC 10.1 02/06/2023 0345   RBC 3.37 (L) 02/06/2023 0345   HGB 10.2 (L) 02/06/2023 0345   HCT 30.6 (L) 02/06/2023 0345   PLT 419 (H) 02/06/2023 0345   MCV 90.8 02/06/2023 0345   MCH 30.3 02/06/2023 0345   MCHC 33.3 02/06/2023 0345   RDW 12.7 02/06/2023 0345   LYMPHSABS 1.4 02/01/2023 0846   MONOABS 1.1 (H) 02/01/2023 0846   EOSABS 0.0 02/01/2023 0846   BASOSABS 0.1 02/01/2023 0846    CMP     Component Value Date/Time   NA 138 02/06/2023 0345   K 3.9 02/06/2023 0345   CL 102 02/06/2023 0345   CO2 26 02/06/2023 0345   GLUCOSE 128 (H) 02/06/2023 0345   BUN 75 (H) 02/06/2023 0345   CREATININE 2.03 (H) 02/06/2023 0345   CALCIUM 8.7 (L) 02/06/2023 0345   PROT  7.4 02/01/2023 0846   ALBUMIN 3.6 02/01/2023 0846   AST 28 02/01/2023 0846   ALT 26 02/01/2023 0846   ALKPHOS 68 02/01/2023 0846   BILITOT 1.0 02/01/2023 0846   GFRNONAA 26 (L) 02/06/2023 0345   GFRAA  >60 12/23/2016 1827     Assessment/Plan:   67 year old female with recent admission for decompensated CHF requiring 2 LPM via [place] and treated with IV Lasix found to be anemic with B12 204 (now on B12 supplement), AKI on CKD that has resolved, and iron of 14 without overt bleeding, negative Hemoccult, last colonoscopy in 2011.  Recent echo with EF 60 to 65%  Anemia Likely multifactorial but there is obvious evidence of iron deficiency.  No overt bleeding.  Respiratory status is back at baseline, no requirement of oxygen, no shortness of breath. - EGD/colonoscopy for further evaluation - Please start iron supplement  BID. Advised of constipation side effects. -- candidate for LEC -- I thoroughly discussed the procedure with the patient (at bedside) to include nature of the procedure, alternatives, benefits, and risks (including but not limited to bleeding, infection, perforation, anesthesia/cardiac pulmonary complications).  Patient verbalized understanding and gave verbal consent to proceed with procedure.  -- recent labwork stable, bring back in 2 weeks for repeat CBC, CMP, Iron studies  History of colon polyps Last colon in 2011, due for repeat  Constipation -- increase colonoscopy prep -- Start taking Miralax 1 capful (17 grams) 1x / day for 1 week.   If this is not effective, increase to 1 dose 2x / day for 1 week.   If this is still not effective, increase to two capfuls (34 grams) 2x / day.   Can adjust dose as needed based on response. Can take 1/2 cap daily, skip days, or increase per day.   --- increase fiber, water, and exercise  Vitamin B12 deficiency On B12 supplement and following with PCP  CHF On diuretics, no respiratory symptoms and no edema.  Adequate ejection fraction  CKD  Discussed this case with Dr. Adela Lank (who I was working with this morning) and he agrees with assessment and plan as above.  Lara Mulch [facility] 02/08/2023,  8:10 AM  Cc: Noberto Retort, MD

## 2023-02-08 NOTE — Progress Notes (Signed)
Noted  

## 2023-02-08 NOTE — Addendum Note (Signed)
Addended by: Lucky Rathke on: 02/08/2023 12:54 PM   Modules accepted: Orders

## 2023-02-08 NOTE — Patient Instructions (Addendum)
Start taking Miralax 1 capful (17 grams) 1x / day for 1 week.   If this is not effective, increase to 1 dose 2x / day for 1 week.   If this is still not effective, increase to two capfuls (34 grams) 2x / day.   Can adjust dose as needed based on response. Can take 1/2 cap daily, skip days, or increase per day.    Repeat labs in 2 weeks ( 02/22/23)   Toileting tips to help with your constipation - Drink at least 64-80 ounces of water/liquid per day. - Establish a time to try to move your bowels every day.  For many people, this is after a cup of coffee or after a meal such as breakfast. - Sit all of the way back on the toilet keeping your back fairly straight and while sitting up, try to rest the tops of your forearms on your upper thighs.   - Raising your feet with a step stool/squatty potty can be helpful to improve the angle that allows your stool to pass through the rectum. - Relax the rectum feeling it bulge toward the toilet water.  If you feel your rectum raising toward your body, you are contracting rather than relaxing. - Breathe in and slowly exhale. "Belly breath" by expanding your belly towards your belly button. Keep belly expanded as you gently direct pressure down and back to the anus.  A low pitched GRRR sound can assist with increasing intra-abdominal pressure.  - Repeat 3-4 times. If unsuccessful, contract the pelvic floor to restore normal tone and get off the toilet.  Avoid excessive straining. - To reduce excessive wiping by teaching your anus to normally contract, place hands on outer aspect of knees and resist knee movement outward.  Hold 5-10 second then place hands just inside of knees and resist inward movement of knees.  Hold 5 seconds.  Repeat a few times each way.  We have sent the following medications to your pharmacy for you to pick up at your convenience: Suprep   You have been scheduled for an endoscopy and colonoscopy. Please follow the written instructions given  to you at your visit today.  Please pick up your prep supplies at the pharmacy within the next 1-3 days.  If you use inhalers (even only as needed), please bring them with you on the day of your procedure.  DO NOT TAKE 7 DAYS PRIOR TO TEST- Trulicity (dulaglutide) Ozempic, Wegovy (semaglutide) Mounjaro (tirzepatide) Bydureon Bcise (exanatide extended release)  DO NOT TAKE 1 DAY PRIOR TO YOUR TEST Rybelsus (semaglutide) Adlyxin (lixisenatide) Victoza (liraglutide) Byetta (exanatide) __________________________________________________________  Due to recent changes in healthcare laws, you may see the results of your imaging and laboratory studies on MyChart before your provider has had a chance to review them.  We understand that in some cases there may be results that are confusing or concerning to you. Not all laboratory results come back in the same time frame and the provider may be waiting for multiple results in order to interpret others.  Please give [place] 48 hours in order for your provider to thoroughly review all the results before contacting the office for clarification of your results.   Thank you for choosing me and [facility].  Boone Master, PA-C

## 2023-03-02 ENCOUNTER — Telehealth: Payer: Self-pay | Admitting: *Deleted

## 2023-03-02 NOTE — Telephone Encounter (Signed)
This encounter has already been addressed. Patient was seen by [facility] on 02/08/23.

## 2023-03-02 NOTE — Telephone Encounter (Signed)
-----   Message from Nurse Alona Bene B sent at 02/06/2023 12:20 PM EDT ----- Gunnar Fusi wants Bayley to see this patinet in 4 weeks post 7 day hold, appt will be made on 02/27/23 for an 03/06/23 appt. Reminder!!

## 2023-03-13 ENCOUNTER — Encounter: Payer: Self-pay | Admitting: Internal Medicine

## 2023-03-30 ENCOUNTER — Encounter: Payer: Self-pay | Admitting: Internal Medicine

## 2023-03-30 ENCOUNTER — Ambulatory Visit: Payer: Medicare PPO | Admitting: Internal Medicine

## 2023-03-30 VITALS — BP 171/80 | HR 84 | Temp 99.0°F | Resp 12 | Ht 61.0 in | Wt 170.0 lb

## 2023-03-30 DIAGNOSIS — Z860100 Personal history of colon polyps, unspecified: Secondary | ICD-10-CM | POA: Diagnosis not present

## 2023-03-30 DIAGNOSIS — K209 Esophagitis, unspecified without bleeding: Secondary | ICD-10-CM | POA: Diagnosis not present

## 2023-03-30 DIAGNOSIS — I509 Heart failure, unspecified: Secondary | ICD-10-CM | POA: Diagnosis not present

## 2023-03-30 DIAGNOSIS — K2100 Gastro-esophageal reflux disease with esophagitis, without bleeding: Secondary | ICD-10-CM | POA: Diagnosis not present

## 2023-03-30 DIAGNOSIS — Z1211 Encounter for screening for malignant neoplasm of colon: Secondary | ICD-10-CM

## 2023-03-30 DIAGNOSIS — D509 Iron deficiency anemia, unspecified: Secondary | ICD-10-CM | POA: Diagnosis not present

## 2023-03-30 DIAGNOSIS — I10 Essential (primary) hypertension: Secondary | ICD-10-CM | POA: Diagnosis not present

## 2023-03-30 DIAGNOSIS — D123 Benign neoplasm of transverse colon: Secondary | ICD-10-CM | POA: Diagnosis not present

## 2023-03-30 DIAGNOSIS — E1165 Type 2 diabetes mellitus with hyperglycemia: Secondary | ICD-10-CM | POA: Diagnosis not present

## 2023-03-30 MED ORDER — IRON (FERROUS SULFATE) 325 (65 FE) MG PO TABS: 1.0000 | ORAL_TABLET | Freq: Every day | ORAL | 11 refills | Status: AC

## 2023-03-30 MED ORDER — SODIUM CHLORIDE 0.9 % IV SOLN: 500.0000 mL | Freq: Once | INTRAVENOUS | Status: DC

## 2023-03-30 NOTE — Patient Instructions (Signed)
**  handout given on polyps**   YOU HAD AN ENDOSCOPIC PROCEDURE TODAY AT THE [facility]:   Refer to the procedure report that was given to you for any specific questions about what was found during the examination.  If the procedure report does not answer your questions, please call your gastroenterologist to clarify.  If you requested that your care partner not be given the details of your procedure findings, then the procedure report has been included in a sealed envelope for you to review at your convenience later.  YOU SHOULD EXPECT: Some feelings of bloating in the abdomen. Passage of more gas than usual.  Walking can help get rid of the air that was put into your GI tract during the procedure and reduce the bloating. If you had a lower endoscopy (such as a colonoscopy or flexible sigmoidoscopy) you may notice spotting of blood in your stool or on the toilet paper. If you underwent a bowel prep for your procedure, you may not have a normal bowel movement for a few days.  Please Note:  You might notice some irritation and congestion in your nose or some drainage.  This is from the oxygen used during your procedure.  There is no need for concern and it should clear up in a day or so.  SYMPTOMS TO REPORT IMMEDIATELY:  Following lower endoscopy (colonoscopy or flexible sigmoidoscopy):  Excessive amounts of blood in the stool  Significant tenderness or worsening of abdominal pains  Swelling of the abdomen that is new, acute  Fever of 100F or higher  Following upper endoscopy (EGD)  Vomiting of blood or coffee ground material  New chest pain or pain under the shoulder blades  Painful or persistently difficult swallowing  New shortness of breath  Fever of 100F or higher  Black, tarry-looking stools  For urgent or emergent issues, a gastroenterologist can be reached at any hour by calling [phone]. Do not use MyChart messaging for urgent concerns.    DIET:  We do  recommend a small meal at first, but then you may proceed to your regular diet.  Drink plenty of fluids but you should avoid alcoholic beverages for 24 hours.  ACTIVITY:  You should plan to take it easy for the rest of today and you should NOT DRIVE or use heavy machinery until tomorrow (because of the sedation medicines used during the test).    FOLLOW UP: Our staff will call the number listed on your records the next business day following your procedure.  We will call around 7:15- 8:00 am to check on you and address any questions or concerns that you may have regarding the information given to you following your procedure. If we do not reach you, we will leave a message.     If any biopsies were taken you will be contacted by phone or by letter within the next 1-3 weeks.  Please call us at [phone] if you have not heard about the biopsies in 3 weeks.    SIGNATURES/CONFIDENTIALITY: You and/or your care partner have signed paperwork which will be entered into your electronic medical record.  These signatures attest to the fact that that the information above on your After Visit Summary has been reviewed and is understood.  Full responsibility of the confidentiality of this discharge information lies with you and/or your care-partner.

## 2023-03-30 NOTE — Progress Notes (Signed)
Called to room to assist during endoscopic procedure.  Patient ID and intended procedure confirmed with present staff. Received instructions for my participation in the procedure from the performing physician.  

## 2023-03-30 NOTE — Op Note (Signed)
[facility] Patient Name: Kimberly Hunt Procedure Date: 03/30/2023 11:52 AM MRN: [id number] Endoscopist: Wilhemina Bonito. Marina Goodell , MD, [id number] Age: 67 Referring MD:  Date of Birth: June 27, 1955 Gender: Female Account #: [id number] Procedure:                Colonoscopy with cold snare polypectomy x 1 Indications:              High risk colon cancer surveillance: Personal                            history of non-advanced adenoma. Iron deficiency                            anemia. Previous examinations 2005, 2011 Medicines:                Monitored Anesthesia Care Procedure:                Pre-Anesthesia Assessment:                           - Prior to the procedure, a History and Physical                            was performed, and patient medications and                            allergies were reviewed. The patient's tolerance of                            previous anesthesia was also reviewed. The risks                            and benefits of the procedure and the sedation                            options and risks were discussed with the patient.                            All questions were answered, and informed consent                            was obtained. Prior Anticoagulants: The patient has                            taken no anticoagulant or antiplatelet agents. ASA                            Grade Assessment: III - A patient with severe                            systemic disease. After reviewing the risks and                            benefits, the patient was deemed in satisfactory  condition to undergo the procedure.                           After obtaining informed consent, the colonoscope                            was passed under direct vision. Throughout the                            procedure, the patient's blood pressure, pulse, and                            oxygen saturations were monitored continuously. The                             CF [id number] #[id number] was introduced through the anus                            and advanced to the the cecum, identified by                            appendiceal orifice and ileocecal valve. The                            ileocecal valve, appendiceal orifice, and rectum                            were photographed. The quality of the bowel                            preparation was excellent. The colonoscopy was                            performed without difficulty. The patient tolerated                            the procedure well. The bowel preparation used was                            SUPREP via split dose instruction. Scope In: 11:58:07 AM Scope Out: 12:10:36 PM Scope Withdrawal Time: 0 hours 9 minutes 59 seconds  Total Procedure Duration: 0 hours 12 minutes 29 seconds  Findings:                 A 2 mm polyp was found in the transverse colon. The                            polyp was removed with a cold snare. Resection and                            retrieval were complete.                           The exam was otherwise without abnormality on  direct and retroflexion views. Complications:            No immediate complications. Estimated blood loss:                            None. Estimated Blood Loss:     Estimated blood loss: none. Impression:               - One 2 mm polyp in the transverse colon, removed                            with a cold snare. Resected and retrieved.                           - The examination was otherwise normal on direct                            and retroflexion views. Recommendation:           - Repeat colonoscopy in 7-10 years for surveillance.                           - Patient has a contact number available for                            emergencies. The signs and symptoms of potential                            delayed complications were discussed with the                            patient. Return  to normal activities tomorrow.                            Written discharge instructions were provided to the                            patient.                           - Resume previous diet.                           - Continue present medications.                           - Await pathology results. Wilhemina Bonito. Marina Goodell, MD 03/30/2023 12:14:41 PM This report has been signed electronically.

## 2023-03-30 NOTE — Op Note (Signed)
[facility] Patient Name: Kimberly Hunt Procedure Date: 03/30/2023 11:52 AM MRN: [id number] Endoscopist: Wilhemina Bonito. Marina Goodell , MD, [id number] Age: 67 Referring MD:  Date of Birth: Apr 02, 1956 Gender: Female Account #: [id number] Procedure:                Upper GI endoscopy Indications:              Iron deficiency anemia Medicines:                Monitored Anesthesia Care Procedure:                Pre-Anesthesia Assessment:                           - Prior to the procedure, a History and Physical                            was performed, and patient medications and                            allergies were reviewed. The patient's tolerance of                            previous anesthesia was also reviewed. The risks                            and benefits of the procedure and the sedation                            options and risks were discussed with the patient.                            All questions were answered, and informed consent                            was obtained. Prior Anticoagulants: The patient has                            taken no anticoagulant or antiplatelet agents. ASA                            Grade Assessment: II - A patient with mild systemic                            disease. After reviewing the risks and benefits,                            the patient was deemed in satisfactory condition to                            undergo the procedure.                           After obtaining informed consent, the endoscope was  passed under direct vision. Throughout the                            procedure, the patient's blood pressure, pulse, and                            oxygen saturations were monitored continuously. The                            GIF [id number] #[id number] was introduced through the                            mouth, and advanced to the second part of duodenum.                            The upper GI endoscopy was  accomplished without                            difficulty. The patient tolerated the procedure                            well. Scope In: Scope Out: Findings:                 The esophagus revealed mild esophagitis.                           The stomach was normal.                           The examined duodenum was normal.                           The cardia and gastric fundus were normal on                            retroflexion. Complications:            No immediate complications. Estimated Blood Loss:     Estimated blood loss: none. Impression:               1. Mild esophagitis (reflux)                           2. Otherwise normal EGD. Recommendation:           - Patient has a contact number available for                            emergencies. The signs and symptoms of potential                            delayed complications were discussed with the                            patient. Return to normal activities tomorrow.  Written discharge instructions were provided to the                            patient.                           - Resume previous diet.                           - Continue present medications.                           -Continue pantoprazole 40 mg daily                           -Resume iron sulfate 325 mg p.o. daily. Please                            prescribe iron sulfate 325 mg; #30; 1 p.o. daily;                            11 refills                           -Return to the care of your primary provider who                            can monitor your blood counts over time                           -GI follow-up as needed Wilhemina Bonito. Marina Goodell, MD 03/30/2023 12:22:33 PM This report has been signed electronically.

## 2023-03-30 NOTE — Progress Notes (Signed)
Expand All Collapse All    Chief Complaint: Iron deficiency anemia Primary GI MD: Dr. Marina Goodell   HPI: 67 year old female history of endometrial cancer s/p hysterectomy, poorly controlled diabetes, hypertension, CHF, presenting for hospital follow-up.   Recently admitted 02/03/2023 with decompensated CHF/pulmonary edema and AKI on CKD.  Patient was put on diuretics for volume overload.  She was found to be anemic with Hgb 7.5 (12.26 January 2022).  No overt bleeding.  Negative fecal occult. patient was put on 2 LPM via [place].  She is not typically on oxygen.   Other workup is notable for - Iron 14, saturation 6%, ferritin 154, TIBC 242 - B12 203, folate 7.9   Patient was discharged with outpatient follow-up to undergo EGD/colonoscopy when respiratory status improved.   Most recent CBC 02/06/2023 with Hgb 10.2 s/p 1 unit PRBCs   Patient states since being discharged she is doing great.  No longer having swelling in her legs and no longer on oxygen.  Having no difficulty breathing.  Still has some fatigue.  Denies overt bleeding.  Notes chronic constipation in which she has a bowel movement every 3 to 4 days with significant straining.     PREVIOUS GI WORKUP    Colonoscopy 2011 1) Diminutive polyp (tubular adenoma) in the ascending colon - removed 2) Otherwise normal examination   EGD 2006 for dyspepsia and abdominal pain - Normal proximal esophagus to second portion of duodenum       Past Medical History:  Diagnosis Date   Anxiety     Arthritis     Depression     Diabetes mellitus without complication (HCC)     Family history of breast cancer 02/08/2021   Family history of prostate cancer 02/08/2021   Heart murmur      no problems mild told in her 30's   Hypertension     Neuromuscular disorder (HCC)      neuropathy feet   Pancreatitis                 Past Surgical History:  Procedure Laterality Date   CERVICAL POLYPECTOMY       CHOLECYSTECTOMY       COLONOSCOPY W/  POLYPECTOMY       ROBOTIC ASSISTED TOTAL HYSTERECTOMY WITH BILATERAL SALPINGO OOPHERECTOMY N/A 01/12/2021    Procedure: XI ROBOTIC ASSISTED TOTAL HYSTERECTOMY WITH BILATERAL SALPINGO OOPHORECTOMY;  Surgeon: Adolphus Birchwood, MD;  Location: [facility];  Service: Gynecology;  Laterality: N/A;   SENTINEL NODE BIOPSY N/A 01/12/2021    Procedure: SENTINEL LYMPH NODE BIOPSY;  Surgeon: Adolphus Birchwood, MD;  Location: [facility];  Service: Gynecology;  Laterality: N/A;   TONSILLECTOMY       UTERINE FIBROID SURGERY                    Current Outpatient Medications  Medication Sig Dispense Refill   acetaminophen (TYLENOL) 500 MG tablet Take 1 tablet (500 mg total) by mouth every 6 (six) hours as needed. (Patient not taking: Reported on 02/01/2023) 30 tablet 0   amLODipine (NORVASC) 10 MG tablet Take 10 mg by mouth at bedtime.       blood glucose meter kit and supplies KIT Dispense based on patient and insurance preference. Use up to four times daily as directed. (FOR ICD-9 250.00, 250.01). 1 each 0   carvedilol (COREG) 6.25 MG tablet Take 1 tablet (6.25 mg total) by mouth 2 (two) times daily with a meal. 60 tablet 3   citalopram (CELEXA) 40  MG tablet Take 40 mg by mouth at bedtime.       cyanocobalamin 1000 MCG tablet Take 1 tablet (1,000 mcg total) by mouth daily. 30 tablet 3   folic acid (FOLVITE) 1 MG tablet Take 1 tablet (1 mg total) by mouth daily. 30 tablet 3   furosemide (LASIX) 40 MG tablet Take 1 tablet (40 mg total) by mouth daily. Take Lasix 40mg  (1 tab) twice a day for 2 days, then continue 40 mg once daily 90 tablet 1   gabapentin (NEURONTIN) 300 MG capsule Take 300 mg by mouth at bedtime as needed (for pain).       HYDROcodone-acetaminophen (NORCO) 7.5-325 MG tablet Take 1-2 tablets by mouth See admin instructions. Take 2 tablets by mouth at bedtime and an additional 1 tablet once a day as needed for pain       insulin aspart (NOVOLOG FLEXPEN) 100 UNIT/ML FlexPen Inject 0-20 Units into the skin 3 (three)  times daily with meals.       LORazepam (ATIVAN) 0.5 MG tablet Take 0.5 mg by mouth 2 (two) times daily as needed for anxiety.       metFORMIN (GLUCOPHAGE-XR) 500 MG 24 hr tablet Take 500 mg by mouth every evening.       pantoprazole (PROTONIX) 40 MG tablet Take 1 tablet (40 mg total) by mouth at bedtime. 30 tablet 3   rosuvastatin (CRESTOR) 10 MG tablet Take 10 mg by mouth every Friday.       TOUJEO MAX SOLOSTAR 300 UNIT/ML Solostar Pen Inject 45 Units into the skin every evening.       zolpidem (AMBIEN) 10 MG tablet Take 10 mg by mouth at bedtime as needed for sleep.   1      No current facility-administered medications for this visit.             Allergies as of 02/08/2023 - Review Complete 02/01/2023  Allergen Reaction Noted   Shellfish allergy Hives and Cough 12/10/2013           Family History  Problem Relation Age of Onset   Breast cancer Mother          dx after 13   Cancer Maternal Aunt          unknown type; dx after 50   Cancer Maternal Uncle          unknown type; dx after 83, x2 mat uncles   Cancer Paternal Aunt          unknown type; dx after 46   Prostate cancer Paternal Uncle          dx after 47; mets          Social History         Socioeconomic History   Marital status: Single      Spouse name: Not on file   Number of children: Not on file   Years of education: Not on file   Highest education level: Not on file  Occupational History   Not on file  Tobacco Use   Smoking status: Former      Current packs/day: 0.00      Types: Cigarettes      Start date: 66      Quit date: 8      Years since quitting: 44.8   Smokeless tobacco: Never  Vaping Use   Vaping status: Never Used  Substance and Sexual Activity   Alcohol use: No   Drug use: No  Sexual activity: Not Currently      Birth control/protection: Post-menopausal  Other Topics Concern   Not on file  Social History Narrative   Not on file    Social Determinants of Health         Financial Resource Strain: Low Risk  (02/04/2021)    Overall Financial Resource Strain (CARDIA)     Difficulty of Paying Living Expenses: Not hard at all  Food Insecurity: No Food Insecurity (02/01/2023)    Hunger Vital Sign     Worried About Running Out of Food in the Last Year: Never true     Ran Out of Food in the Last Year: Never true  Transportation Needs: No Transportation Needs (02/01/2023)    PRAPARE - Therapist, art (Medical): No     Lack of Transportation (Non-Medical): No  Physical Activity: Inactive (02/04/2021)    Exercise Vital Sign     Days of Exercise per Week: 0 days     Minutes of Exercise per Session: 0 min  Stress: No Stress Concern Present (02/04/2021)    [employer] of Occupational Health - Occupational Stress Questionnaire     Feeling of Stress : Only a little  Social Connections: Moderately Isolated (02/04/2021)    Social Connection and Isolation Panel [NHANES]     Frequency of Communication with Friends and Family: Three times a week     Frequency of Social Gatherings with Friends and Family: Once a week     Attends Religious Services: 1 to 4 times per year     Active Member of [employer] or Organizations: No     Attends Banker Meetings: Never     Marital Status: Never married  Intimate Partner Violence: Not At Risk (02/01/2023)    Humiliation, Afraid, Rape, and Kick questionnaire     Fear of Current or Ex-Partner: No     Emotionally Abused: No     Physically Abused: No     Sexually Abused: No      Review of Systems:    Constitutional: No weight loss, fever, chills, weakness or fatigue HEENT: Eyes: No change in vision               Ears, Nose, Throat:  No change in hearing or congestion Skin: No rash or itching Cardiovascular: No chest pain, chest pressure or palpitations   Respiratory: No SOB or cough Gastrointestinal: See HPI and otherwise negative Genitourinary: No dysuria or change in urinary  frequency Neurological: No headache, dizziness or syncope Musculoskeletal: No new muscle or joint pain Hematologic: No bleeding or bruising Psychiatric: No history of depression or anxiety      Physical Exam:  Vital signs: There were no vitals taken for this visit.   Constitutional: NAD, Well developed, Well nourished, alert and cooperative.  appears younger than stated age Head:  Normocephalic and atraumatic. Eyes:   PEERL, EOMI. No icterus. Conjunctiva pink. Respiratory: Respirations even and unlabored. Lungs clear to auscultation bilaterally.   No wheezes, crackles, or rhonchi.  Cardiovascular:  Regular rate and rhythm. No peripheral edema, cyanosis or pallor.  Gastrointestinal:  Soft, nondistended, nontender. No rebound or guarding. Normal bowel sounds. No appreciable masses or hepatomegaly. Rectal:  Not performed.  Msk:  Symmetrical without gross deformities. Without edema, no deformity or joint abnormality.  Neurologic:  Alert and  oriented x4;  grossly normal neurologically.  Skin:   Dry and intact without significant lesions or rashes. Psychiatric: Oriented to person, place and  time. Demonstrates good judgement and reason without abnormal affect or behaviors.     RELEVANT LABS AND IMAGING: CBC Labs (Brief)          Component Value Date/Time    WBC 10.1 02/06/2023 0345    RBC 3.37 (L) 02/06/2023 0345    HGB 10.2 (L) 02/06/2023 0345    HCT 30.6 (L) 02/06/2023 0345    PLT 419 (H) 02/06/2023 0345    MCV 90.8 02/06/2023 0345    MCH 30.3 02/06/2023 0345    MCHC 33.3 02/06/2023 0345    RDW 12.7 02/06/2023 0345    LYMPHSABS 1.4 02/01/2023 0846    MONOABS 1.1 (H) 02/01/2023 0846    EOSABS 0.0 02/01/2023 0846    BASOSABS 0.1 02/01/2023 0846        CMP     Labs (Brief)          Component Value Date/Time    NA 138 02/06/2023 0345    K 3.9 02/06/2023 0345    CL 102 02/06/2023 0345    CO2 26 02/06/2023 0345    GLUCOSE 128 (H) 02/06/2023 0345    BUN 75 (H) 02/06/2023  0345    CREATININE 2.03 (H) 02/06/2023 0345    CALCIUM 8.7 (L) 02/06/2023 0345    PROT 7.4 02/01/2023 0846    ALBUMIN 3.6 02/01/2023 0846    AST 28 02/01/2023 0846    ALT 26 02/01/2023 0846    ALKPHOS 68 02/01/2023 0846    BILITOT 1.0 02/01/2023 0846    GFRNONAA 26 (L) 02/06/2023 0345    GFRAA >60 12/23/2016 1827          Assessment/Plan:    67 year old female with recent admission for decompensated CHF requiring 2 LPM via [place] and treated with IV Lasix found to be anemic with B12 204 (now on B12 supplement), AKI on CKD that has resolved, and iron of 14 without overt bleeding, negative Hemoccult, last colonoscopy in 2011.  Recent echo with EF 60 to 65%   Anemia Likely multifactorial but there is obvious evidence of iron deficiency.  No overt bleeding.  Respiratory status is back at baseline, no requirement of oxygen, no shortness of breath. - EGD/colonoscopy for further evaluation - Please start iron supplement  BID. Advised of constipation side effects. -- candidate for LEC -- I thoroughly discussed the procedure with the patient (at bedside) to include nature of the procedure, alternatives, benefits, and risks (including but not limited to bleeding, infection, perforation, anesthesia/cardiac pulmonary complications).  Patient verbalized understanding and gave verbal consent to proceed with procedure.  -- recent labwork stable, bring back in 2 weeks for repeat CBC, CMP, Iron studies   History of colon polyps Last colon in 2011, due for repeat   Constipation -- increase colonoscopy prep -- Start taking Miralax 1 capful (17 grams) 1x / day for 1 week.   If this is not effective, increase to 1 dose 2x / day for 1 week.   If this is still not effective, increase to two capfuls (34 grams) 2x / day.   Can adjust dose as needed based on response. Can take 1/2 cap daily, skip days, or increase per day.   --- increase fiber, water, and exercise   Vitamin B12 deficiency On B12 supplement  and following with PCP   CHF On diuretics, no respiratory symptoms and no edema.  Adequate ejection fraction   CKD   Discussed this case with Dr. Adela Lank (who I was working with this morning) and  he agrees with assessment and plan as above.   Boone Master, PA-C [facility] 02/08/2023, 8:10 AM

## 2023-03-30 NOTE — Progress Notes (Signed)
Sedate, gd SR, tolerated procedure well, VSS, report to RN 

## 2023-03-31 ENCOUNTER — Telehealth: Payer: Self-pay

## 2023-03-31 NOTE — Telephone Encounter (Signed)
Follow up call to pt, unable to leave voice message due to full mailbox

## 2023-04-03 ENCOUNTER — Encounter: Payer: Self-pay | Admitting: Internal Medicine

## 2023-04-03 LAB — SURGICAL PATHOLOGY

## 2023-04-27 ENCOUNTER — Other Ambulatory Visit: Payer: Medicare PPO

## 2023-04-27 ENCOUNTER — Encounter: Payer: Self-pay | Admitting: Internal Medicine

## 2023-04-27 ENCOUNTER — Ambulatory Visit: Payer: Medicare PPO | Admitting: Internal Medicine

## 2023-04-27 VITALS — BP 138/70 | HR 72 | Ht 64.0 in | Wt 174.0 lb

## 2023-04-27 DIAGNOSIS — D649 Anemia, unspecified: Secondary | ICD-10-CM

## 2023-04-27 DIAGNOSIS — K5904 Chronic idiopathic constipation: Secondary | ICD-10-CM | POA: Diagnosis not present

## 2023-04-27 DIAGNOSIS — D509 Iron deficiency anemia, unspecified: Secondary | ICD-10-CM

## 2023-04-27 DIAGNOSIS — Z8679 Personal history of other diseases of the circulatory system: Secondary | ICD-10-CM

## 2023-04-27 DIAGNOSIS — Z860101 Personal history of adenomatous and serrated colon polyps: Secondary | ICD-10-CM

## 2023-04-27 DIAGNOSIS — K5900 Constipation, unspecified: Secondary | ICD-10-CM

## 2023-04-27 DIAGNOSIS — E538 Deficiency of other specified B group vitamins: Secondary | ICD-10-CM

## 2023-04-27 LAB — CBC WITH DIFFERENTIAL/PLATELET
Basophils Absolute: 0.1 10*3/uL (ref 0.0–0.1)
Basophils Relative: 1.2 % (ref 0.0–3.0)
Eosinophils Absolute: 0.3 10*3/uL (ref 0.0–0.7)
Eosinophils Relative: 4.4 % (ref 0.0–5.0)
HCT: 35.6 % — ABNORMAL LOW (ref 36.0–46.0)
Hemoglobin: 12 g/dL (ref 12.0–15.0)
Lymphocytes Relative: 25.8 % (ref 12.0–46.0)
Lymphs Abs: 1.8 10*3/uL (ref 0.7–4.0)
MCHC: 33.7 g/dL (ref 30.0–36.0)
MCV: 90.6 fL (ref 78.0–100.0)
Monocytes Absolute: 0.5 10*3/uL (ref 0.1–1.0)
Monocytes Relative: 6.9 % (ref 3.0–12.0)
Neutro Abs: 4.3 10*3/uL (ref 1.4–7.7)
Neutrophils Relative %: 61.7 % (ref 43.0–77.0)
Platelets: 406 10*3/uL — ABNORMAL HIGH (ref 150.0–400.0)
RBC: 3.92 Mil/uL (ref 3.87–5.11)
RDW: 13.6 % (ref 11.5–15.5)
WBC: 7 10*3/uL (ref 4.0–10.5)

## 2023-04-27 LAB — FERRITIN: Ferritin: 173 ng/mL (ref 10.0–291.0)

## 2023-04-27 NOTE — Progress Notes (Signed)
 HISTORY OF PRESENT ILLNESS:  Kimberly Hunt is a 68 y.o. female with multiple significant medical problems including diabetes mellitus, chronic renal insufficiency, congestive heart failure, and multifactorial anemia (iron  deficiency, B12 deficiency, chronic renal insufficiency).  She was hospitalized October 2024 with shortness of breath secondary to decompensated congestive heart failure.  She was treated, discharged, and seen in our office February 08, 2023.  At that time she was clinically better and set up for colonoscopy and upper endoscopy to evaluate the iron  deficiency portion of her anemia.  Colonoscopy and upper endoscopy were performed March 30, 2023.  Colonoscopy revealed a diminutive adenoma which was removed.  No other abnormalities.  Follow-up in 7 years recommended.  Upper endoscopy was normal.  She was told to continue PPI and resume iron .  She was told to follow-up with her PCP to monitor blood counts.  However, she follows up at this time with GI.  The last CBC in her chart was from February 06, 2023.  Hemoglobin 10.2.  MCV 90.8.  Patient tells me that she does have occasional constipation.  She may use MiraLAX on demand.  This works.  She is on a daily iron  supplement.  She tells me that she will experience some shortness of breath at night.  She has not followed up with a PCP since her endoscopic procedures.  REVIEW OF SYSTEMS:  All non-GI ROS negative except for anxiety, visual change, fatigue, sleeping problems  Past Medical History:  Diagnosis Date   Anemia    Anxiety    Arthritis    Depression    Diabetes mellitus without complication (HCC)    Family history of breast cancer 02/08/2021   Family history of prostate cancer 02/08/2021   Heart murmur    no problems mild told in her 30's   Hypertension    Neuromuscular disorder (HCC)    neuropathy feet   Pancreatitis     Past Surgical History:  Procedure Laterality Date   CERVICAL POLYPECTOMY      CHOLECYSTECTOMY     COLONOSCOPY W/ POLYPECTOMY  2011   ESOPHAGOGASTRODUODENOSCOPY  2011   ROBOTIC ASSISTED TOTAL HYSTERECTOMY WITH BILATERAL SALPINGO OOPHERECTOMY N/A 01/12/2021   Procedure: XI ROBOTIC ASSISTED TOTAL HYSTERECTOMY WITH BILATERAL SALPINGO OOPHORECTOMY;  Surgeon: Eloy Herring, MD;  Location: [facility];  Service: Gynecology;  Laterality: N/A;   SENTINEL NODE BIOPSY N/A 01/12/2021   Procedure: SENTINEL LYMPH NODE BIOPSY;  Surgeon: Eloy Herring, MD;  Location: [facility];  Service: Gynecology;  Laterality: N/A;   TONSILLECTOMY     UTERINE FIBROID SURGERY      Social History Kimberly Hunt  reports that she quit smoking about 45 years ago. Her smoking use included cigarettes. She started smoking about 55 years ago. She has never used smokeless tobacco. She reports that she does not drink alcohol and does not use drugs.  family history includes Breast cancer in her mother; Cancer in her maternal aunt, maternal uncle, and paternal aunt; Prostate cancer in her paternal uncle.  Allergies  Allergen Reactions   Shellfish Allergy Hives and Cough       PHYSICAL EXAMINATION: Vital signs: BP 138/70 (BP Location: Left Arm, Patient Position: Sitting, Cuff Size: Normal)   Pulse 72   Ht 5' 4 (1.626 m)   Wt 174 lb (78.9 kg)   BMI 29.87 kg/m  General: Well-developed, well-nourished, no acute distress HEENT: Sclerae are anicteric, conjunctiva pink. Oral mucosa intact Lungs: Clear Heart: Regular Abdomen: soft, nontender, nondistended, no obvious ascites, no  peritoneal signs, normal bowel sounds. No organomegaly. Extremities: No edema Psychiatric: alert and oriented x3. Cooperative     ASSESSMENT:  1.  Multifactorial anemia 2.  Recent colonoscopy and upper endoscopy as described.  Benign adenoma only. 3.  History of congestive heart failure with paroxysmal nocturnal dyspnea.  Lungs clear on today's exam 4.  Multiple medical problems 5.  Functional constipation  PLAN:  1.  Continue  iron  supplementation daily 2.  MiraLAX as needed for constipation 3.  B12 replacement with PCP 4.  CBC and ferritin today 5.  Follow-up with PCP regarding complaints of nocturnal dyspnea 6.  Surveillance colonoscopy 7 years.  Otherwise, GI follow-up as needed Total time of 30 minutes was spent preparing to see the patient, obtaining interval history, performing medically appropriate physical exam, counseling and educating the patient regarding above listed issues, ordering blood work, and documenting clinical information in the health record

## 2023-04-27 NOTE — Patient Instructions (Addendum)
 Your provider has requested that you go to the basement level for lab work before leaving today. Press B on the elevator. The lab is located at the first door on the left as you exit the elevator.  _______________________________________________________  If your blood pressure at your visit was 140/90 or greater, please contact your primary care physician to follow up on this.   Please follow up as needed _______________________________________________________  If you are age 70 or older, your body mass index should be between 23-30. Your Body mass index is 29.87 kg/m. If this is out of the aforementioned range listed, please consider follow up with your Primary Care Provider.  If you are age 77 or younger, your body mass index should be between 19-25. Your Body mass index is 29.87 kg/m. If this is out of the aformentioned range listed, please consider follow up with your Primary Care Provider.   ________________________________________________________  The [facility] providers would like to encourage you to use MYCHART to communicate with providers for non-urgent requests or questions.  Due to long hold times on the telephone, sending your provider a message by [facility] may be a faster and more efficient way to get a response.  Please allow 48 business hours for a response.  Please remember that this is for non-urgent requests.  _______________________________________________________

## 2023-06-08 DIAGNOSIS — N1832 Chronic kidney disease, stage 3b: Secondary | ICD-10-CM | POA: Diagnosis not present

## 2023-06-08 DIAGNOSIS — I10 Essential (primary) hypertension: Secondary | ICD-10-CM | POA: Diagnosis not present

## 2023-06-08 DIAGNOSIS — I5032 Chronic diastolic (congestive) heart failure: Secondary | ICD-10-CM | POA: Diagnosis not present

## 2023-07-20 DIAGNOSIS — H5203 Hypermetropia, bilateral: Secondary | ICD-10-CM | POA: Diagnosis not present

## 2023-07-20 DIAGNOSIS — H25013 Cortical age-related cataract, bilateral: Secondary | ICD-10-CM | POA: Diagnosis not present

## 2023-07-20 DIAGNOSIS — H52223 Regular astigmatism, bilateral: Secondary | ICD-10-CM | POA: Diagnosis not present

## 2023-07-20 DIAGNOSIS — H16223 Keratoconjunctivitis sicca, not specified as Sjogren's, bilateral: Secondary | ICD-10-CM | POA: Diagnosis not present

## 2023-07-20 DIAGNOSIS — E113211 Type 2 diabetes mellitus with mild nonproliferative diabetic retinopathy with macular edema, right eye: Secondary | ICD-10-CM | POA: Diagnosis not present

## 2023-07-20 DIAGNOSIS — H524 Presbyopia: Secondary | ICD-10-CM | POA: Diagnosis not present

## 2023-07-20 DIAGNOSIS — E113292 Type 2 diabetes mellitus with mild nonproliferative diabetic retinopathy without macular edema, left eye: Secondary | ICD-10-CM | POA: Diagnosis not present

## 2023-07-20 DIAGNOSIS — H35033 Hypertensive retinopathy, bilateral: Secondary | ICD-10-CM | POA: Diagnosis not present

## 2023-07-20 DIAGNOSIS — H2513 Age-related nuclear cataract, bilateral: Secondary | ICD-10-CM | POA: Diagnosis not present

## 2023-07-24 DIAGNOSIS — E785 Hyperlipidemia, unspecified: Secondary | ICD-10-CM | POA: Diagnosis not present

## 2023-07-24 DIAGNOSIS — I509 Heart failure, unspecified: Secondary | ICD-10-CM | POA: Diagnosis not present

## 2023-07-24 DIAGNOSIS — I129 Hypertensive chronic kidney disease with stage 1 through stage 4 chronic kidney disease, or unspecified chronic kidney disease: Secondary | ICD-10-CM | POA: Diagnosis not present

## 2023-07-24 DIAGNOSIS — E1122 Type 2 diabetes mellitus with diabetic chronic kidney disease: Secondary | ICD-10-CM | POA: Diagnosis not present

## 2023-07-24 DIAGNOSIS — I130 Hypertensive heart and chronic kidney disease with heart failure and stage 1 through stage 4 chronic kidney disease, or unspecified chronic kidney disease: Secondary | ICD-10-CM | POA: Diagnosis not present

## 2023-07-24 DIAGNOSIS — N1832 Chronic kidney disease, stage 3b: Secondary | ICD-10-CM | POA: Diagnosis not present

## 2023-08-18 DIAGNOSIS — N1832 Chronic kidney disease, stage 3b: Secondary | ICD-10-CM | POA: Diagnosis not present

## 2023-08-18 DIAGNOSIS — I10 Essential (primary) hypertension: Secondary | ICD-10-CM | POA: Diagnosis not present

## 2023-08-18 DIAGNOSIS — F411 Generalized anxiety disorder: Secondary | ICD-10-CM | POA: Diagnosis not present

## 2023-08-18 DIAGNOSIS — M549 Dorsalgia, unspecified: Secondary | ICD-10-CM | POA: Diagnosis not present

## 2023-08-18 DIAGNOSIS — E7800 Pure hypercholesterolemia, unspecified: Secondary | ICD-10-CM | POA: Diagnosis not present

## 2023-08-18 DIAGNOSIS — F324 Major depressive disorder, single episode, in partial remission: Secondary | ICD-10-CM | POA: Diagnosis not present

## 2023-08-18 DIAGNOSIS — E1142 Type 2 diabetes mellitus with diabetic polyneuropathy: Secondary | ICD-10-CM | POA: Diagnosis not present

## 2023-08-18 DIAGNOSIS — E538 Deficiency of other specified B group vitamins: Secondary | ICD-10-CM | POA: Diagnosis not present

## 2023-08-18 DIAGNOSIS — K219 Gastro-esophageal reflux disease without esophagitis: Secondary | ICD-10-CM | POA: Diagnosis not present

## 2023-10-04 DIAGNOSIS — N184 Chronic kidney disease, stage 4 (severe): Secondary | ICD-10-CM | POA: Diagnosis not present

## 2023-10-04 DIAGNOSIS — N1832 Chronic kidney disease, stage 3b: Secondary | ICD-10-CM | POA: Diagnosis not present

## 2023-10-04 DIAGNOSIS — I130 Hypertensive heart and chronic kidney disease with heart failure and stage 1 through stage 4 chronic kidney disease, or unspecified chronic kidney disease: Secondary | ICD-10-CM | POA: Diagnosis not present

## 2023-10-04 DIAGNOSIS — E785 Hyperlipidemia, unspecified: Secondary | ICD-10-CM | POA: Diagnosis not present

## 2023-10-04 DIAGNOSIS — I509 Heart failure, unspecified: Secondary | ICD-10-CM | POA: Diagnosis not present

## 2023-10-04 DIAGNOSIS — E1122 Type 2 diabetes mellitus with diabetic chronic kidney disease: Secondary | ICD-10-CM | POA: Diagnosis not present

## 2023-10-12 DIAGNOSIS — N1832 Chronic kidney disease, stage 3b: Secondary | ICD-10-CM | POA: Diagnosis not present

## 2023-10-23 DIAGNOSIS — N184 Chronic kidney disease, stage 4 (severe): Secondary | ICD-10-CM | POA: Diagnosis not present

## 2023-11-07 DIAGNOSIS — R42 Dizziness and giddiness: Secondary | ICD-10-CM | POA: Diagnosis not present

## 2023-11-07 DIAGNOSIS — I10 Essential (primary) hypertension: Secondary | ICD-10-CM | POA: Diagnosis not present

## 2023-11-07 DIAGNOSIS — I5022 Chronic systolic (congestive) heart failure: Secondary | ICD-10-CM | POA: Diagnosis not present

## 2023-12-15 DIAGNOSIS — H2513 Age-related nuclear cataract, bilateral: Secondary | ICD-10-CM | POA: Diagnosis not present

## 2023-12-15 DIAGNOSIS — H2511 Age-related nuclear cataract, right eye: Secondary | ICD-10-CM | POA: Diagnosis not present

## 2023-12-15 DIAGNOSIS — H18413 Arcus senilis, bilateral: Secondary | ICD-10-CM | POA: Diagnosis not present

## 2023-12-15 DIAGNOSIS — H40013 Open angle with borderline findings, low risk, bilateral: Secondary | ICD-10-CM | POA: Diagnosis not present

## 2023-12-15 DIAGNOSIS — E113293 Type 2 diabetes mellitus with mild nonproliferative diabetic retinopathy without macular edema, bilateral: Secondary | ICD-10-CM | POA: Diagnosis not present

## 2023-12-15 DIAGNOSIS — H35033 Hypertensive retinopathy, bilateral: Secondary | ICD-10-CM | POA: Diagnosis not present

## 2023-12-22 DIAGNOSIS — E113211 Type 2 diabetes mellitus with mild nonproliferative diabetic retinopathy with macular edema, right eye: Secondary | ICD-10-CM | POA: Diagnosis not present

## 2023-12-22 DIAGNOSIS — H43813 Vitreous degeneration, bilateral: Secondary | ICD-10-CM | POA: Diagnosis not present

## 2023-12-22 DIAGNOSIS — H2513 Age-related nuclear cataract, bilateral: Secondary | ICD-10-CM | POA: Diagnosis not present

## 2023-12-22 DIAGNOSIS — H35371 Puckering of macula, right eye: Secondary | ICD-10-CM | POA: Diagnosis not present

## 2024-01-01 NOTE — Progress Notes (Deleted)
 Cardiology Office Note:    Date:  01/01/2024   ID:  NOLIE BIGNELL, DOB 07-02-1955, MRN [id number]  PCP:  Arloa Elsie SAUNDERS, MD  Cardiologist:  None  Electrophysiologist:  None   Referring MD: Wonda Worth SQUIBB, PA   No chief complaint on file. ***  History of Present Illness:    Kimberly Hunt is a 68 y.o. female with a hx of hypertension, diabetes, endometrial cancer who is referred by Worth Wonda, PA for evaluation of heart failure.  She was admitted with worsening dyspnea on 01/2023.  Thought to be acute on chronic diastolic heart failure.  She was started on IV Lasix  but had worsening renal function.  Nephrology was consulted and her IV Lasix  was increased.  She was discharged on p.o. Lasix .  Echocardiogram showed EF 60 to 65%, G2DD, normal RV function, no significant valvular disease.  Past Medical History:  Diagnosis Date   Anemia    Anxiety    Arthritis    Depression    Diabetes mellitus without complication (HCC)    Family history of breast cancer 02/08/2021   Family history of prostate cancer 02/08/2021   Heart murmur    no problems mild told in her 30's   Hypertension    Neuromuscular disorder (HCC)    neuropathy feet   Pancreatitis     Past Surgical History:  Procedure Laterality Date   CERVICAL POLYPECTOMY     CHOLECYSTECTOMY     COLONOSCOPY W/ POLYPECTOMY  2011   ESOPHAGOGASTRODUODENOSCOPY  2011   ROBOTIC ASSISTED TOTAL HYSTERECTOMY WITH BILATERAL SALPINGO OOPHERECTOMY N/A 01/12/2021   Procedure: XI ROBOTIC ASSISTED TOTAL HYSTERECTOMY WITH BILATERAL SALPINGO OOPHORECTOMY;  Surgeon: Eloy Herring, MD;  Location: [facility];  Service: Gynecology;  Laterality: N/A;   SENTINEL NODE BIOPSY N/A 01/12/2021   Procedure: SENTINEL LYMPH NODE BIOPSY;  Surgeon: Eloy Herring, MD;  Location: [facility];  Service: Gynecology;  Laterality: N/A;   TONSILLECTOMY     UTERINE FIBROID SURGERY      Current Medications: No outpatient medications have been marked as taking for the  01/04/24 encounter (Appointment) with Kate Lonni CROME, MD.     Allergies:   Shellfish allergy   Social History   Socioeconomic History   Marital status: Single    Spouse name: Not on file   Number of children: Not on file   Years of education: Not on file   Highest education level: Not on file  Occupational History   Not on file  Tobacco Use   Smoking status: Former    Current packs/day: 0.00    Types: Cigarettes    Start date: 28    Quit date: 51    Years since quitting: 45.7   Smokeless tobacco: Never  Vaping Use   Vaping status: Never Used  Substance and Sexual Activity   Alcohol use: No   Drug use: No   Sexual activity: Not Currently    Birth control/protection: Post-menopausal  Other Topics Concern   Not on file  Social History Narrative   Not on file   Social Drivers of Health   Financial Resource Strain: Low Risk  (02/04/2021)   Overall Financial Resource Strain (CARDIA)    Difficulty of Paying Living Expenses: Not hard at all  Food Insecurity: No Food Insecurity (02/01/2023)   Hunger Vital Sign    Worried About Running Out of Food in the Last Year: Never true    Ran Out of Food in the Last Year: Never true  Transportation Needs: No Transportation Needs (02/01/2023)   PRAPARE - Administrator, Civil Service (Medical): No    Lack of Transportation (Non-Medical): No  Physical Activity: Inactive (02/04/2021)   Exercise Vital Sign    Days of Exercise per Week: 0 days    Minutes of Exercise per Session: 0 min  Stress: No Stress Concern Present (02/04/2021)   [employer] of Occupational Health - Occupational Stress Questionnaire    Feeling of Stress : Only a little  Social Connections: Moderately Isolated (02/04/2021)   Social Connection and Isolation Panel    Frequency of Communication with Friends and Family: Three times a week    Frequency of Social Gatherings with Friends and Family: Once a week    Attends Religious Services: 1  to 4 times per year    Active Member of [employer] or Organizations: No    Attends Engineer, structural: Never    Marital Status: Never married     Family History: The patient's ***family history includes Breast cancer in her mother; Cancer in her maternal aunt, maternal uncle, and paternal aunt; Prostate cancer in her paternal uncle.  ROS:   Please see the history of present illness.    *** All other systems reviewed and are negative.  EKGs/Labs/Other Studies Reviewed:    The following studies were reviewed today: ***  EKG:  EKG is *** ordered today.  The ekg ordered today demonstrates ***  Recent Labs: 02/01/2023: ALT 26; B Natriuretic Peptide 661.1 02/06/2023: BUN 75; Creatinine, Ser 2.03; Potassium 3.9; Sodium 138 04/27/2023: Hemoglobin 12.0; Platelets 406.0  Recent Lipid Panel    Component Value Date/Time   CHOL  05/13/2010 0910    115        ATP III CLASSIFICATION:  <200     mg/dL   Desirable  799-760  mg/dL   Borderline High  >=759    mg/dL   High          TRIG 63 05/13/2010 0910   HDL 38 (L) 05/13/2010 0910   CHOLHDL 3.0 05/13/2010 0910   VLDL 13 05/13/2010 0910   LDLCALC  05/13/2010 0910    64        Total Cholesterol/HDL:CHD Risk Coronary Heart Disease Risk Table                     Men   Women  1/2 Average Risk   3.4   3.3  Average Risk       5.0   4.4  2 X Average Risk   9.6   7.1  3 X Average Risk  23.4   11.0        Use the calculated Patient Ratio above and the CHD Risk Table to determine the patient's CHD Risk.        ATP III CLASSIFICATION (LDL):  <100     mg/dL   Optimal  899-870  mg/dL   Near or Above                    Optimal  130-159  mg/dL   Borderline  839-810  mg/dL   High  >809     mg/dL   Very High    Physical Exam:    VS:  There were no vitals taken for this visit.    Wt Readings from Last 3 Encounters:  04/27/23 174 lb (78.9 kg)  03/30/23 170 lb (77.1 kg)  02/08/23 170 lb 12.8 oz (77.5  kg)     GEN: *** Well  nourished, well developed in no acute distress HEENT: Normal NECK: No JVD; No carotid bruits LYMPHATICS: No lymphadenopathy CARDIAC: ***RRR, no murmurs, rubs, gallops RESPIRATORY:  Clear to auscultation without rales, wheezing or rhonchi  ABDOMEN: Soft, non-tender, non-distended MUSCULOSKELETAL:  No edema; No deformity  SKIN: Warm and dry NEUROLOGIC:  Alert and oriented x 3 PSYCHIATRIC:  Normal affect   ASSESSMENT:    No diagnosis found. PLAN:    Acute on chronic diastolic heart failure: She was admitted with worsening dyspnea on 01/2023.  Thought to be acute on chronic diastolic heart failure.  She was started on IV Lasix  but had worsening renal function.  Nephrology was consulted and her IV Lasix  was increased.  She was discharged on p.o. Lasix .  Echocardiogram showed EF 60 to 65%, G2DD, normal RV function, no significant valvular disease. - Continue Lasix  40 mg daily  Hypertension: On amlodipine  10 mg daily, carvedilol  6.25 mg twice daily  Hyperlipidemia: On rosuvastatin 10 mg once weekly  AKI on CKD: During admission 01/2023, presented with creatinine 1.8, worsened to 2.0 with diuresis.  Follows with nephrology  T2DM: On metformin, insulin   RTC in ***   Medication Adjustments/Labs and Tests Ordered: Current medicines are reviewed at length with the patient today.  Concerns regarding medicines are outlined above.  No orders of the defined types were placed in this encounter.  No orders of the defined types were placed in this encounter.   There are no Patient Instructions on file for this visit.   Signed, Lonni LITTIE Nanas, MD  01/01/2024 11:06 AM    [facility]

## 2024-01-04 ENCOUNTER — Ambulatory Visit: Admitting: Cardiology

## 2024-01-16 ENCOUNTER — Encounter (HOSPITAL_COMMUNITY): Payer: Self-pay | Admitting: Emergency Medicine

## 2024-01-16 ENCOUNTER — Emergency Department (HOSPITAL_COMMUNITY)
Admission: EM | Admit: 2024-01-16 | Discharge: 2024-01-16 | Disposition: A | Discharge status: Home / Self Care | Attending: Emergency Medicine | Admitting: Emergency Medicine

## 2024-01-16 ENCOUNTER — Other Ambulatory Visit: Payer: Self-pay

## 2024-01-16 DIAGNOSIS — Z794 Long term (current) use of insulin: Secondary | ICD-10-CM | POA: Insufficient documentation

## 2024-01-16 DIAGNOSIS — J029 Acute pharyngitis, unspecified: Secondary | ICD-10-CM | POA: Insufficient documentation

## 2024-01-16 LAB — RESP PANEL BY RT-PCR (RSV, FLU A&B, COVID)  RVPGX2
Influenza A by PCR: NEGATIVE
Influenza B by PCR: NEGATIVE
Resp Syncytial Virus by PCR: NEGATIVE
SARS Coronavirus 2 by RT PCR: NEGATIVE

## 2024-01-16 LAB — GROUP A STREP BY PCR: Group A Strep by PCR: NOT DETECTED

## 2024-01-16 NOTE — ED Provider Notes (Signed)
 [place] EMERGENCY DEPARTMENT AT [facility] Provider Note   CSN: [id number] Arrival date & time: 01/16/24  9449     Patient presents with: Sore Throat   Kimberly Hunt is a 68 y.o. female.   68 year old female presents today for concern of pharyngitis.  Started 3 days ago.  No fever during this timeframe.  Endorses pain with swallowing but states this has resolved.  Denies drooling or difficulty with swallowing.  No chest pain, shortness of breath.  History of tonsillectomy.  She was worried about having strep throat so she came in for evaluation.   The history is provided by the patient. No language interpreter was used.       Prior to Admission medications   Medication Sig Start Date End Date Taking? Authorizing Provider  acetaminophen  (TYLENOL ) 500 MG tablet Take 1 tablet (500 mg total) by mouth every 6 (six) hours as needed. 01/12/21   Eloy Herring, MD  amLODipine  (NORVASC ) 10 MG tablet Take 10 mg by mouth at bedtime. 08/31/20   [provider]  blood glucose meter kit and supplies KIT Dispense based on patient and insurance preference. Use up to four times daily as directed. (FOR ICD-9 250.00, 250.01). 06/04/14   Verdene Purchase, MD  carvedilol  (COREG ) 6.25 MG tablet Take 1 tablet (6.25 mg total) by mouth 2 (two) times daily with a meal. 02/06/23   Rai, Ripudeep K, MD  citalopram (CELEXA) 40 MG tablet Take 40 mg by mouth at bedtime.    [provider]  cyanocobalamin  1000 MCG tablet Take 1 tablet (1,000 mcg total) by mouth daily. 02/07/23   Rai, Ripudeep K, MD  Ferrous Sulfate  (IRON ) 325 (65 Fe) MG TABS Take 1 tablet (325 mg total) by mouth once for 1 dose. Take with OJ or vitamin C 02/08/23 04/27/23  McMichael, Bayley M, PA-C  folic acid  (FOLVITE ) 1 MG tablet Take 1 tablet (1 mg total) by mouth daily. 02/07/23   Rai, Nydia POUR, MD  furosemide  (LASIX ) 40 MG tablet Take 1 tablet (40 mg total) by mouth daily. Take Lasix  40mg  (1 tab) twice a day for 2 days,  then continue 40 mg once daily 02/06/23   Rai, Ripudeep K, MD  gabapentin (NEURONTIN) 300 MG capsule Take 300 mg by mouth at bedtime as needed (for pain). 08/25/20   [provider]  HYDROcodone -acetaminophen  (NORCO) 7.5-325 MG tablet Take 1-2 tablets by mouth See admin instructions. Take 2 tablets by mouth at bedtime and an additional 1 tablet once a day as needed for pain    [provider]  insulin  aspart (NOVOLOG  FLEXPEN) 100 UNIT/ML FlexPen Inject 0-20 Units into the skin 3 (three) times daily with meals. 09/04/19   [provider]  Iron , Ferrous Sulfate , 325 (65 Fe) MG TABS Take 1 tablet by mouth daily. 03/30/23   Abran Norleen SAILOR, MD  LORazepam  (ATIVAN ) 0.5 MG tablet Take 0.5 mg by mouth 2 (two) times daily as needed for anxiety. 08/18/20   [provider]  metFORMIN (GLUCOPHAGE-XR) 500 MG 24 hr tablet Take 500 mg by mouth every evening. 12/21/20   [provider]  pantoprazole  (PROTONIX ) 40 MG tablet Take 1 tablet (40 mg total) by mouth at bedtime. 02/06/23   Rai, Nydia POUR, MD  rosuvastatin (CRESTOR) 10 MG tablet Take 10 mg by mouth every Friday. 01/19/23   [provider]  TOUJEO  MAX SOLOSTAR 300 UNIT/ML Solostar Pen Inject 45 Units into the skin every evening. 11/20/20   [provider]  zolpidem (AMBIEN) 10 MG tablet Take 10 mg by mouth at bedtime as needed for sleep. 10/31/16   [provider]    Allergies: Shellfish allergy    Review of Systems  Constitutional:  Negative for fever.  Respiratory:  Negative for cough and shortness of breath.   Cardiovascular:  Negative for chest pain.  All other systems reviewed and are negative.   Updated Vital Signs BP (!) 159/88 (BP Location: Right Arm)   Pulse 74   Temp 98.6 F (37 C)   Resp 18   Wt 65.8 kg   SpO2 98%   BMI 24.89 kg/m   Physical Exam Vitals and nursing note reviewed.  Constitutional:      General: She is not in acute distress.    Appearance: Normal  appearance. She is not ill-appearing.  HENT:     Head: Normocephalic and atraumatic.     Nose: Nose normal.     Mouth/Throat:     Mouth: Mucous membranes are moist.     Pharynx: Posterior oropharyngeal erythema present.     Comments: No evidence of RPA, PTA, or Ludwig's angina.  Without trismus Eyes:     Conjunctiva/sclera: Conjunctivae normal.  Pulmonary:     Effort: Pulmonary effort is normal. No respiratory distress.  Musculoskeletal:        General: No deformity.  Skin:    Findings: No rash.  Neurological:     Mental Status: She is alert.     (all labs ordered are listed, but only abnormal results are displayed) Labs Reviewed  GROUP A STREP BY PCR  RESP PANEL BY RT-PCR (RSV, FLU A&B, COVID)  [id number]    EKG: None  Radiology: No results found.   Procedures   Medications Ordered in the ED - No data to display                                  Medical Decision Making  68 year old female presents today for concern of pharyngitis.  Ongoing for 3 days.  Symptoms are improving.  She was worried about strep pharyngitis so she came in for evaluation. Exam with minimal erythema of the pharynx.  Otherwise exam is reassuring.  She is speaking in a normal voice and in complete sentences.  Denies any anginal symptoms. Supportive care discussed. Discussed close follow-up with PCP. Discharged in stable condition.  Return precaution discussed.  Final diagnoses:  Pharyngitis, unspecified etiology    ED Discharge Orders     None          Hildegard Loge, PA-C 01/16/24 9183    Dreama Longs, MD 01/17/24 1039

## 2024-01-16 NOTE — Discharge Instructions (Addendum)
 Follow-up with your primary care doctor for reevaluation in a few days.  Return for any concerning symptoms.  Your exam was reassuring.  You likely have upper respiratory infection that is viral in nature.  If you develop any chest pain, shortness of breath return for reevaluation. Your strep test and respiratory panel were checked for flu, RSV, and COVID was negative.

## 2024-01-16 NOTE — ED Triage Notes (Signed)
 Patient c/o right side throat pain x 3 days.  Patient reports it feels like her lymph node on that side is swollen.  Patient denies sick contacts, does not have her tonsils.

## 2024-02-07 DIAGNOSIS — N184 Chronic kidney disease, stage 4 (severe): Secondary | ICD-10-CM | POA: Diagnosis not present

## 2024-02-07 DIAGNOSIS — E875 Hyperkalemia: Secondary | ICD-10-CM | POA: Diagnosis not present

## 2024-02-07 DIAGNOSIS — E1122 Type 2 diabetes mellitus with diabetic chronic kidney disease: Secondary | ICD-10-CM | POA: Diagnosis not present

## 2024-02-07 DIAGNOSIS — I509 Heart failure, unspecified: Secondary | ICD-10-CM | POA: Diagnosis not present

## 2024-02-07 DIAGNOSIS — I130 Hypertensive heart and chronic kidney disease with heart failure and stage 1 through stage 4 chronic kidney disease, or unspecified chronic kidney disease: Secondary | ICD-10-CM | POA: Diagnosis not present

## 2024-02-26 DIAGNOSIS — E113311 Type 2 diabetes mellitus with moderate nonproliferative diabetic retinopathy with macular edema, right eye: Secondary | ICD-10-CM | POA: Diagnosis not present

## 2024-02-26 DIAGNOSIS — H35371 Puckering of macula, right eye: Secondary | ICD-10-CM | POA: Diagnosis not present

## 2024-02-26 DIAGNOSIS — H2511 Age-related nuclear cataract, right eye: Secondary | ICD-10-CM | POA: Diagnosis not present

## 2024-02-26 DIAGNOSIS — E113211 Type 2 diabetes mellitus with mild nonproliferative diabetic retinopathy with macular edema, right eye: Secondary | ICD-10-CM | POA: Diagnosis not present

## 2024-02-26 DIAGNOSIS — H5371 Glare sensitivity: Secondary | ICD-10-CM | POA: Diagnosis not present

## 2024-02-27 DIAGNOSIS — E1142 Type 2 diabetes mellitus with diabetic polyneuropathy: Secondary | ICD-10-CM | POA: Diagnosis not present

## 2024-02-27 DIAGNOSIS — Z0000 Encounter for general adult medical examination without abnormal findings: Secondary | ICD-10-CM | POA: Diagnosis not present

## 2024-02-27 DIAGNOSIS — M549 Dorsalgia, unspecified: Secondary | ICD-10-CM | POA: Diagnosis not present

## 2024-02-27 DIAGNOSIS — E7800 Pure hypercholesterolemia, unspecified: Secondary | ICD-10-CM | POA: Diagnosis not present

## 2024-02-27 DIAGNOSIS — F324 Major depressive disorder, single episode, in partial remission: Secondary | ICD-10-CM | POA: Diagnosis not present

## 2024-02-27 DIAGNOSIS — I5032 Chronic diastolic (congestive) heart failure: Secondary | ICD-10-CM | POA: Diagnosis not present

## 2024-02-27 DIAGNOSIS — F411 Generalized anxiety disorder: Secondary | ICD-10-CM | POA: Diagnosis not present

## 2024-02-27 DIAGNOSIS — E538 Deficiency of other specified B group vitamins: Secondary | ICD-10-CM | POA: Diagnosis not present

## 2024-02-27 DIAGNOSIS — I10 Essential (primary) hypertension: Secondary | ICD-10-CM | POA: Diagnosis not present

## 2024-02-27 DIAGNOSIS — Z23 Encounter for immunization: Secondary | ICD-10-CM | POA: Diagnosis not present

## 2024-03-01 ENCOUNTER — Ambulatory Visit: Attending: Cardiology | Admitting: Cardiology

## 2024-03-01 VITALS — BP 142/68 | HR 64 | Ht 64.5 in | Wt 147.0 lb

## 2024-03-01 DIAGNOSIS — E1165 Type 2 diabetes mellitus with hyperglycemia: Secondary | ICD-10-CM | POA: Diagnosis not present

## 2024-03-01 DIAGNOSIS — N183 Chronic kidney disease, stage 3 unspecified: Secondary | ICD-10-CM

## 2024-03-01 DIAGNOSIS — E785 Hyperlipidemia, unspecified: Secondary | ICD-10-CM | POA: Diagnosis not present

## 2024-03-01 DIAGNOSIS — I5032 Chronic diastolic (congestive) heart failure: Secondary | ICD-10-CM | POA: Diagnosis not present

## 2024-03-01 DIAGNOSIS — Z794 Long term (current) use of insulin: Secondary | ICD-10-CM | POA: Diagnosis not present

## 2024-03-01 DIAGNOSIS — E1169 Type 2 diabetes mellitus with other specified complication: Secondary | ICD-10-CM

## 2024-03-01 DIAGNOSIS — I10 Essential (primary) hypertension: Secondary | ICD-10-CM | POA: Diagnosis not present

## 2024-03-01 DIAGNOSIS — I5043 Acute on chronic combined systolic (congestive) and diastolic (congestive) heart failure: Secondary | ICD-10-CM

## 2024-03-01 NOTE — Progress Notes (Signed)
 Cardiology Office Note:  .   Date:  03/06/2024  ID:  Kimberly Hunt, DOB July 12, 1955, MRN [id number] PCP: Arloa Elsie SAUNDERS, MD  [facility] Providers Cardiologist:  None     Chief Complaint  Patient presents with   Hospitalization Follow-up    Patient was hospitalized a year ago with heart failure.  Probably referred for cardiology evaluation.   Congestive Heart Failure    Diastolic heart failure noted 1 year ago.  Likely HFpEF.    Patient Profile: .     Kimberly Hunt is a obese 67 y.o. female with a PMH notable for HTN (hypertensive heart disease with?  HFpEF), HLD, DM-2 and CKD-3 who presents here for follow-up of Diastolic CHF Diagnosed October 2024 at the request of Turmel, Caleb P, PA.  ([email or web address] )  PMH: HTN -> ?  HFpEF HLD with DM-2 CKD 3-4 History of endometrial cancer  Admitted to [facility] February 01, 2023 for acute respiratory failure with hypoxia, acute HFpEF and AKI    Kimberly Hunt was seen by Worth Salts PA in February 2025 to discuss an episode of heart failure in October 2024 when she presented with acute CHF and AKI.  At that time she denied any chest pain weakness or fatigue.  She did note some orthopnea.  No edema.  She had also not seen a nephrologist at [facility].  Her blood pressure was 160/67.  She was converted from Toprol 50 mg to carvedilol  6.25 mg twice daily.  She had CKD 3B so she was referred to [facility]  Subjective  Discussed the use of AI scribe software for clinical note transcription with the patient, who gave verbal consent to proceed.  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 She was referred by Dr. Cherl Moloney, PA for evaluation of her heart condition.  In October 2024, she experienced dyspnea due to pulmonary edema, necessitating a hospital visit. Since then, she has not  had similar episodes. She was diagnosed with diastolic heart failure and started on Entresto during that episode. She is on Entresto 24/26 mg and carvedilol  6.25 mg twice daily. No shortness of breath during daily activities, walking, or at night, and no chest pain, heart palpitations, or peripheral edema.  She has a history of hypertension and was previously on amlodipine  10 mg, which was discontinued due to orthostatic hypotension/dizziness. Her blood pressure has been slightly elevated recently due to increased activity and anxiety. She is currently on carvedilol  and Entresto for blood pressure management.  She was diagnosed with stage 4 kidney disease in June, with a creatinine level of 2.0 at that time. Recent labs indicate an improvement in her creatinine to 1.64. She is under the care of a nephrologist who monitors her kidney function.-More consistent with CKD 3  She is on multiple medications for her diabetes and hyperlipidemia, including Jardiance 10 mg daily, Mounjaro started 3-4 months ago, and rosuvastatin 10 mg weekly. She has lost weight, now weighing 147 pounds, down from 180 pounds, and reports excellent blood sugar control.  Cardiovascular ROS: positive for - maybe some mild exertional dyspnea but notably improved with weight loss negative for - chest pain, edema, irregular heartbeat, loss of consciousness, orthopnea, palpitations, paroxysmal nocturnal dyspnea, rapid heart rate, shortness of breath, or near syncope, TIA/amaurosis fugax or claudication.  Melena, hematochezia, hematuria or epistaxis.  Claudication.  ROS:  Review of Systems - Negative except symptoms noted above    Objective   Current Meds CV Medication Sig   amLODipine  (NORVASC ) 10 MG tablet Take 10 mg by mouth at bedtime. - STOPPED BY PCP FOR LOW BP    ENTRESTO 24-26 MG Take 1 tablet by mouth 2 (two) times daily.   carvedilol  (COREG ) 6.25 MG tablet Take 1 tablet (6.25 mg total) by mouth 2 (two) times daily with a  meal.   furosemide  (LASIX ) 40 MG tablet Take 1 tablet (40 mg total) by mouth daily.   insulin  aspart (NOVOLOG  FLEXPEN) 100 UNIT/ML FlexPen Inject 0-20 Units into the skin 3 (three) times daily with meals.   JARDIANCE 10 MG TABS tablet Take 10 mg by mouth daily.   MOUNJARO 2.5 MG/0.5ML Pen once a week.   rosuvastatin (CRESTOR) 10 MG tablet Take 10 mg by mouth every Friday.   TOUJEO  MAX SOLOSTAR 300 UNIT/ML Solostar Pen Inject 45 Units into the skin every evening.    Medication Sig   acetaminophen  (TYLENOL ) 500 MG tablet Take 1 tablet (500 mg total) by mouth every 6 (six) hours as needed.   BESIVANCE 0.6 % SUSP Place 1 drop into the right eye 4 (four) times daily.   busPIRone (BUSPAR) 5 MG tablet Take 2.5-5 mg by mouth 2 (two) times daily.   citalopram (CELEXA) 40 MG tablet Take 40 mg by mouth at bedtime.   cyanocobalamin  1000 MCG tablet Take 1 tablet (1,000 mcg total) by mouth daily.   DUREZOL 0.05 % EMUL Place 1 drop into the right eye 4 (four) times daily.   folic acid  (FOLVITE ) 1 MG tablet Take 1 tablet (1 mg total) by mouth daily.   gabapentin (NEURONTIN) 300 MG capsule Take 300 mg by mouth at bedtime as needed (for pain).   gatifloxacin (ZYMAXID) 0.5 % SOLN 1 drop 4 (four) times daily.   HYDROcodone -acetaminophen  (NORCO) 7.5-325 MG tablet Take 1-2 tablets by mouth See admin instructions. Take 2 tablets by mouth at bedtime and an additional 1 tablet DAILY PRN PAIN   Iron , Ferrous Sulfate , 325 (65 Fe) MG TABS Take 1 tablet by mouth daily.   ketorolac  (ACULAR ) 0.5 % ophthalmic solution 1 drop 2 (two) times daily.   pantoprazole  (PROTONIX ) 40 MG tablet Take 1 tablet (40 mg total) by mouth at bedtime.   prednisoLONE acetate (PRED FORTE) 1 % ophthalmic suspension 1 drop 4 (four) times daily.   PROLENSA 0.07 % SOLN Place 1 drop into the right eye 2 (two) times daily.   zolpidem (AMBIEN) 10 MG tablet Take 10 mg by mouth at bedtime as needed for sleep.  -  Studies Reviewed: SABRA       EKG from  today: Sinus rhythm, rate 64 bpm.  Computer reads cannot rule out anterior my, not corrected.  No significant change compared to 02/01/2023  Labs from [facility]: Renal panel  02/07/2024 10/04/2023 06/10/2023    Hgb 11.1, PLT 348 BNP 24  (From [facility]) 01/19/2023: TC 184, TG 198, HDL 40, LDL 108.   Cardiac Studies ECHO: EF 60 to 65%.  No RWMA.  GR 2 DD.  Elevated LVEDP with mild LA dilation.  Normal RV size and function.  Normal RVP and RAP.  Normal aortic and mitral valves..  (02/02/2023)  CXR: Normal heart size and contours.  Lungs are clear.  No active disease. - CLEARED PULMONARY EDEMA (02/04/2023) CXR 02/01/2023: Pulmonary with trace bilateral pleural effusions.   Risk Assessment/Calculations:  Physical Exam:   VS:  BP (!) 142/68   Pulse 64   Ht 5' 4.5 (1.638 m)   Wt 147 lb (66.7 kg)   SpO2 96%   BMI 24.84 kg/m    Wt Readings from Last 3 Encounters:  03/01/24 147 lb (66.7 kg)  01/16/24 145 lb (65.8 kg)  04/27/23 174 lb (78.9 kg)    GEN: Well nourished, well developed; in no acute distress; healthy-appearing NECK: No JVD; No carotid bruits CARDIAC: Normal S1, S2; RRR, no murmurs, rubs, gallops RESPIRATORY:  Clear to auscultation without rales, wheezing or rhonchi ; nonlabored, good air movement. ABDOMEN: Soft, non-tender, non-distended EXTREMITIES:  No edema; No deformity      ASSESSMENT AND PLAN: .    Problem List Items Addressed This Visit       Cardiology Problems   Chronic heart failure with preserved ejection fraction (HFpEF, >= 50%) (HCC) - Primary (Chronic)   Chronic Diastolic Heart Failure -class I symptoms, no significant limitations.  Diastolic dysfunction likely due to long-standing hypertension.  (Hypertensive heart disease) Symptoms improved cardiac function with weight loss. No recent dyspnea, orthopnea, or significant edema.  Appropriately started on GDMT medications including Entresto as preferred afterload reduction agent and  carvedilol .. Euvolemic on exam.  Not on diuretic. GDMT: - Continue Entresto 24/26 mg twice daily & Carvedilol  6.25 mg twice daily. - Continue Jardiance 10 mg daily along with Mounjaro 2.5 mg weekly. - She remains on 40 mg furosemide . In follow-up may want to consider reducing dose and potentially adding spironolactone if renal function stabilizes. Also discussed sliding scale-taking extra dose if she has weight gain more than 3 pounds in 1 day or 5 pounds in 1 week. - Not on spironolactone because of concerns for renal insufficiency, and lack of edema.   - Monitor weight daily, adjust furosemide  based on weight changes. - Educated on dietary salt restriction. - Encouraged regular physical activity.      Relevant Medications   ENTRESTO 24-26 MG   Other Relevant Orders   EKG 12-Lead (Completed)   Hyperlipidemia associated with type 2 diabetes mellitus (HCC) (Chronic)   Labs do not appear to have been checked since last September.  Defer to PCP.  She is currently on rosuvastatin 10 mg daily.  With diabetes, not unreasonable to consider treating for LDL least less than 100 if not less than 70 based on comorbidity.      Relevant Medications   MOUNJARO 2.5 MG/0.5ML Pen   ENTRESTO 24-26 MG   JARDIANCE 10 MG TABS tablet   Primary hypertension (Chronic)   Hypertension associated with hypertensive heart disease. Blood pressure has been variable, with some recent elevations due to anxiety and recent surgery. Amlodipine  discontinued due to hypotension. Current regimen effective. - Continue twice daily dosing of both Entresto 24-26 mg g and carvedilol  6.25 mg. - Monitor blood pressure regularly with intermittent checks. => If pressures drift up, would preferentially increase Entresto dose as opposed to restarting amlodipine .      Relevant Medications   ENTRESTO 24-26 MG     Other   CKD (chronic kidney disease) stage 3, GFR 30-59 ml/min (HCC) (Chronic)   Chronic kidney disease with  improved renal function. Creatinine at 1.64. Current medications are kidney protective. Nephrologist monitoring. - Continue current medications. - Continue nephrologist follow-up.      Type 2 diabetes mellitus with hyperglycemia (HCC) (Chronic)   Type 2 diabetes mellitus well controlled. Recent weight loss and improved blood sugar levels. Current medications include Jardiance  and Mounjaro. - Continue Jardiance 10 mg daily, and Toujeo  insulin . - Continue Mounjaro as prescribed. - Monitor blood sugar levels regularly.      Relevant Medications   MOUNJARO 2.5 MG/0.5ML Pen   JARDIANCE 10 MG TABS tablet         Follow-Up: Return in about 6 months (around 08/29/2024) for 6 month follow-up with me, [employer].  I spent 53 minutes in the care of Lakeithia D Meech today including reviewing outside labs from [facility] (2 minutes), reviewing studies (chest x-rays reviewed and echocardiogram reviewed-5 minutes), face to face time discussing treatment options (25 minutes), reviewing records from hospitalization back in October 2024 as well as follow-up PCP notes x 3 (10 minutes), 11 minutes dictating, and documenting in the encounter.      Signed, Alm MICAEL Clay, MD, MS Alm Clay, M.D., M.S. Interventional Cardiologist  [facility] Pager # [phone]

## 2024-03-01 NOTE — Patient Instructions (Signed)
 Medication Instructions:   Sliding Scale for Furosemide  Take 40 mg daily  if 3 lbs heavier  take 40 mg twice a day until weight goes back to your dry weight   *If you need a refill on your cardiac medications before your next appointment, please call your pharmacy*   Lab Work: Not needed    Testing/Procedures:  Not needed  Follow-Up: At [facility], you and your health needs are our priority.  As part of our continuing mission to provide you with exceptional heart care, we have created designated Provider Care Teams.  These Care Teams include your primary Cardiologist (physician) and Advanced Practice Providers (APPs -  Physician Assistants and Nurse Practitioners) who all work together to provide you with the care you need, when you need it.     Your next appointment:   6 month(s)  The format for your next appointment:   In Person  Provider:   None

## 2024-03-06 ENCOUNTER — Encounter: Payer: Self-pay | Admitting: Cardiology

## 2024-03-06 DIAGNOSIS — N183 Chronic kidney disease, stage 3 unspecified: Secondary | ICD-10-CM | POA: Insufficient documentation

## 2024-03-06 DIAGNOSIS — E1169 Type 2 diabetes mellitus with other specified complication: Secondary | ICD-10-CM | POA: Insufficient documentation

## 2024-03-06 DIAGNOSIS — I10 Essential (primary) hypertension: Secondary | ICD-10-CM | POA: Insufficient documentation

## 2024-03-06 NOTE — Assessment & Plan Note (Signed)
 Chronic Diastolic Heart Failure -class I symptoms, no significant limitations.  Diastolic dysfunction likely due to long-standing hypertension.  (Hypertensive heart disease) Symptoms improved cardiac function with weight loss. No recent dyspnea, orthopnea, or significant edema.  Appropriately started on GDMT medications including Entresto as preferred afterload reduction agent and carvedilol .. Euvolemic on exam.  Not on diuretic. GDMT: - Continue Entresto 24/26 mg twice daily & Carvedilol  6.25 mg twice daily. - Continue Jardiance 10 mg daily along with Mounjaro 2.5 mg weekly. - She remains on 40 mg furosemide . In follow-up may want to consider reducing dose and potentially adding spironolactone if renal function stabilizes. Also discussed sliding scale-taking extra dose if she has weight gain more than 3 pounds in 1 day or 5 pounds in 1 week. - Not on spironolactone because of concerns for renal insufficiency, and lack of edema.   - Monitor weight daily, adjust furosemide  based on weight changes. - Educated on dietary salt restriction. - Encouraged regular physical activity.

## 2024-03-06 NOTE — Assessment & Plan Note (Signed)
 Chronic kidney disease with improved renal function. Creatinine at 1.64. Current medications are kidney protective. Nephrologist monitoring. - Continue current medications. - Continue nephrologist follow-up.

## 2024-03-06 NOTE — Assessment & Plan Note (Signed)
 Type 2 diabetes mellitus well controlled. Recent weight loss and improved blood sugar levels. Current medications include Jardiance and Mounjaro. - Continue Jardiance 10 mg daily, and Toujeo  insulin . - Continue Mounjaro as prescribed. - Monitor blood sugar levels regularly.

## 2024-03-06 NOTE — Assessment & Plan Note (Signed)
 Labs do not appear to have been checked since last September.  Defer to PCP.  She is currently on rosuvastatin 10 mg daily.  With diabetes, not unreasonable to consider treating for LDL least less than 100 if not less than 70 based on comorbidity.

## 2024-03-06 NOTE — Assessment & Plan Note (Signed)
 Hypertension associated with hypertensive heart disease. Blood pressure has been variable, with some recent elevations due to anxiety and recent surgery. Amlodipine  discontinued due to hypotension. Current regimen effective. - Continue twice daily dosing of both Entresto 24-26 mg g and carvedilol  6.25 mg. - Monitor blood pressure regularly with intermittent checks. => If pressures drift up, would preferentially increase Entresto dose as opposed to restarting amlodipine .

## 2024-03-19 DIAGNOSIS — H2512 Age-related nuclear cataract, left eye: Secondary | ICD-10-CM | POA: Diagnosis not present
